# Patient Record
Sex: Male | Born: 1990 | Race: White | Hispanic: No | Marital: Single | Smoking: Never smoker
Health system: Southern US, Community
[De-identification: ages and names within clinical notes are randomized; demographics above are authoritative.]

## PROBLEM LIST (undated history)

## (undated) DIAGNOSIS — F840 Autistic disorder: Secondary | ICD-10-CM

## (undated) DIAGNOSIS — F419 Anxiety disorder, unspecified: Secondary | ICD-10-CM

## (undated) DIAGNOSIS — J302 Other seasonal allergic rhinitis: Secondary | ICD-10-CM

## (undated) DIAGNOSIS — C801 Malignant (primary) neoplasm, unspecified: Secondary | ICD-10-CM

## (undated) DIAGNOSIS — F79 Unspecified intellectual disabilities: Secondary | ICD-10-CM

## (undated) HISTORY — PX: PORTACATH PLACEMENT: SHX2246

---

## 2013-07-05 ENCOUNTER — Emergency Department (HOSPITAL_COMMUNITY)
Admission: EM | Admit: 2013-07-05 | Discharge: 2013-07-06 | Disposition: A | Discharge status: Home / Self Care | Payer: Self-pay | Attending: Emergency Medicine | Admitting: Emergency Medicine

## 2013-07-05 ENCOUNTER — Emergency Department (HOSPITAL_COMMUNITY): Payer: Self-pay

## 2013-07-05 ENCOUNTER — Encounter (HOSPITAL_COMMUNITY): Payer: Self-pay | Admitting: *Deleted

## 2013-07-05 DIAGNOSIS — Z880 Allergy status to penicillin: Secondary | ICD-10-CM | POA: Insufficient documentation

## 2013-07-05 DIAGNOSIS — N5089 Other specified disorders of the male genital organs: Secondary | ICD-10-CM | POA: Insufficient documentation

## 2013-07-05 LAB — CBC WITH DIFFERENTIAL/PLATELET
Basophils Relative: 0 % (ref 0–1)
Eosinophils Relative: 1 % (ref 0–5)
HCT: 43.6 % (ref 39.0–52.0)
Hemoglobin: 15 g/dL (ref 13.0–17.0)
Lymphocytes Relative: 17 % (ref 12–46)
MCHC: 34.4 g/dL (ref 30.0–36.0)
MCV: 83.7 fL (ref 78.0–100.0)
Monocytes Absolute: 1.2 10*3/uL — ABNORMAL HIGH (ref 0.1–1.0)
Monocytes Relative: 10 % (ref 3–12)
Neutro Abs: 8.6 10*3/uL — ABNORMAL HIGH (ref 1.7–7.7)

## 2013-07-05 LAB — BASIC METABOLIC PANEL
BUN: 13 mg/dL (ref 6–23)
CO2: 28 mEq/L (ref 19–32)
Chloride: 102 mEq/L (ref 96–112)
GFR calc Af Amer: >90 mL/min (ref >90)
Potassium: 3.4 mEq/L — ABNORMAL LOW (ref 3.5–5.1)

## 2013-07-05 LAB — URINALYSIS, ROUTINE W REFLEX MICROSCOPIC
Bilirubin Urine: NEGATIVE
Hgb urine dipstick: NEGATIVE
Specific Gravity, Urine: 1.025 (ref 1.005–1.030)
pH: 8.5 — ABNORMAL HIGH (ref 5.0–8.0)

## 2013-07-05 NOTE — ED Provider Notes (Addendum)
Scribed for Donnetta Hutching, MD, the patient was seen in room APA01/APA01. This chart was scribed by Lewanda Rife, ED scribe. Patient's care was started at 2206  CSN: [id number]     Arrival date & time 07/05/13  2013 History     First MD Initiated Contact with Patient 07/05/13 2159     Chief Complaint  Patient presents with  . Groin Swelling  . Testicle Pain   (Consider location/radiation/quality/duration/timing/severity/associated sxs/prior Treatment) The history is provided by the patient.   HPI Comments: Devon Bailey is a 22 y.o. male who presents to the Emergency Department complaining of constant profound left testicular swelling onset unknown, but more than 1 month. Denies associated pain or urinary symptoms. Denies alleviating or aggravating factors. No fever, chills, dysuria, weight loss.   Mother reports patient has ADHD History reviewed. No pertinent past medical history. History reviewed. No pertinent past surgical history. History reviewed. No pertinent family history. History  Substance Use Topics  . Smoking status: Never Smoker   . Smokeless tobacco: Not on file  . Alcohol Use: No    Review of Systems  Genitourinary: Positive for scrotal swelling (left testicle). Negative for testicular pain.   A complete 10 system review of systems was obtained and all systems are negative except as noted in the HPI and PMH.    Allergies  Penicillins  Home Medications  No current outpatient prescriptions on file. BP 138/89  Pulse 118  Temp(Src) 98.2 F (36.8 C) (Oral)  Resp 20  Ht 5\' 10"  (1.778 m)  Wt 218 lb 8 oz (99.111 kg)  BMI 31.35 kg/m2  SpO2 98% Physical Exam  Nursing note and vitals reviewed. Constitutional: He is oriented to person, place, and time. He appears well-developed and well-nourished.  HENT:  Head: Normocephalic and atraumatic.  Eyes: Conjunctivae and EOM are normal. Pupils are equal, round, and reactive to light.  Neck: Normal range of motion. Neck  supple.  Cardiovascular: Normal rate, regular rhythm and normal heart sounds.   Pulmonary/Chest: Effort normal and breath sounds normal.  Abdominal: Soft. Bowel sounds are normal.  Genitourinary: Right testis shows no tenderness. Left testis shows no tenderness. No penile tenderness.  Left testicle is 10 by 7 cm, oval in shape and non-tender.  Right testicle is normal   Musculoskeletal: Normal range of motion.  Neurological: He is alert and oriented to person, place, and time.  Skin: Skin is warm and dry.  Psychiatric: He has a normal mood and affect.    ED Course   Procedures (including critical care time) Medications - No data to display  Labs Reviewed - No data to display No results found. No diagnosis found.  MDM  Left testicle grossly enlarged. Will obtain ultrasound.   Discussed with Dr Fleet Contras   I personally performed the services described in this documentation, which was scribed in my presence. The recorded information has been reviewed and is accurate.  `  Donnetta Hutching, MD 07/05/13 2310  Donnetta Hutching, MD 07/06/13 4098

## 2013-07-05 NOTE — ED Notes (Signed)
Pt reporting pain and swelling of left testicle.  Very vague on history of complaint.  Unknown injury.  Pt states "it's been in the last couple weeks, I think".  Pt states pain "feels more like a bruise" than sharp stabbing pain.

## 2013-07-05 NOTE — ED Notes (Signed)
Pt states his left testicle has been swollen for about a month. Pain upon touching or moving.

## 2013-07-06 NOTE — ED Provider Notes (Signed)
Patient informed of results, will followup with urology.  Vida Roller, MD 07/06/13 [phone]

## 2013-07-06 NOTE — Progress Notes (Signed)
Pt charted on in error, in ventilator section by noter. Pt never on ventilator , results removed.

## 2013-08-05 ENCOUNTER — Encounter (HOSPITAL_COMMUNITY): Payer: Self-pay

## 2013-08-05 ENCOUNTER — Encounter (HOSPITAL_COMMUNITY)
Admission: RE | Admit: 2013-08-05 | Discharge: 2013-08-05 | Disposition: A | Discharge status: Home / Self Care | Payer: Medicaid Other | Source: Ambulatory Visit | Attending: Urology | Admitting: Urology

## 2013-08-05 DIAGNOSIS — Z01818 Encounter for other preprocedural examination: Secondary | ICD-10-CM | POA: Insufficient documentation

## 2013-08-05 DIAGNOSIS — Z01812 Encounter for preprocedural laboratory examination: Secondary | ICD-10-CM | POA: Insufficient documentation

## 2013-08-05 HISTORY — DX: Anxiety disorder, unspecified: F41.9

## 2013-08-05 HISTORY — DX: Autistic disorder: F84.0

## 2013-08-05 HISTORY — DX: Other seasonal allergic rhinitis: J30.2

## 2013-08-05 HISTORY — DX: Unspecified intellectual disabilities: F79

## 2013-08-05 LAB — BASIC METABOLIC PANEL
BUN: 13 mg/dL (ref 6–23)
CO2: 29 mEq/L (ref 19–32)
Calcium: 10.2 mg/dL (ref 8.4–10.5)
Creatinine, Ser: 1 mg/dL (ref 0.50–1.35)
Glucose, Bld: 110 mg/dL — ABNORMAL HIGH (ref 70–99)

## 2013-08-05 LAB — CBC
Hemoglobin: 14.3 g/dL (ref 13.0–17.0)
MCH: 27.8 pg (ref 26.0–34.0)
MCV: 83.7 fL (ref 78.0–100.0)
RBC: 5.15 MIL/uL (ref 4.22–5.81)

## 2013-08-05 NOTE — Patient Instructions (Signed)
Devon Bailey  08/05/2013   Your procedure is scheduled on: 08/07/13  Report to [facility] at [place] AM.  Call this number if you have problems the morning of surgery: [phone]   Remember:   Do not eat food or drink liquids after midnight.   Take these medicines the morning of surgery with A SIP OF WATER: none   Do not wear jewelry, make-up or nail polish.  Do not wear lotions, powders, or perfumes. You may wear deodorant.  Do not shave 48 hours prior to surgery. Men may shave face and neck.  Do not bring valuables to the hospital.  [facility] is not responsible                   for any belongings or valuables.  Contacts, dentures or bridgework may not be worn into surgery.  Leave suitcase in the car. After surgery it may be brought to your room.  For patients admitted to the hospital, checkout time is 11:00 AM the day of  discharge.   Patients discharged the day of surgery will not be allowed to drive  home.  Name and phone number of your driver: family  Special Instructions: Shower using CHG 2 nights before surgery and the night before surgery.  If you shower the day of surgery use CHG.  Use special wash - you have one bottle of CHG for all showers.  You should use approximately 1/3 of the bottle for each shower.Please read over the following fact sheets that you were given: Pain Booklet, Coughing and Deep Breathing, Surgical Site Infection Prevention, Anesthesia Post-op Instructions and Care and Recovery After Surgery  PATIENT INSTRUCTIONS POST-ANESTHESIA  IMMEDIATELY FOLLOWING SURGERY:  Do not drive or operate machinery for the first twenty four hours after surgery.  Do not make any important decisions for twenty four hours after surgery or while taking narcotic pain medications or sedatives.  If you develop intractable nausea and vomiting or a severe headache please notify your doctor immediately.  FOLLOW-UP:  Please make an appointment with your surgeon as instructed. You do not  need to follow up with anesthesia unless specifically instructed to do so.  WOUND CARE INSTRUCTIONS (if applicable):  Keep a dry clean dressing on the anesthesia/puncture wound site if there is drainage.  Once the wound has quit draining you may leave it open to air.  Generally you should leave the bandage intact for twenty four hours unless there is drainage.  If the epidural site drains for more than 36-48 hours please call the anesthesia department.  QUESTIONS?:  Please feel free to call your physician or the hospital operator if you have any questions, and they will be happy to assist you.                    Orchiectomy An orchiectomy is the removal of the testicles. This is something you should have discussed at length with your surgeon or caregiver prior to having this done. This is a man's main source of the male sex hormone testosterone. An orchiectomy is most often done to treat cancer of the prostate. Prostatic cancer usually needs testosterone to grow.  The main advantages of this procedure are that it is safe, effective and simple with a low risk of problems or complications. Surgery is also less expensive than monthly injections over the long run. The surgery has the same effects as hormone treatment. It eliminates the need for daily pills or monthly shots. Drawbacks  to surgery are it is permanent. This procedure can be done in ways that make you appear to be anatomically intact following the surgery. The testicles can be replaced with artificial testicles.  The major disadvantage seems to be psychological. Some men consider this procedure a loss of being a male. However, the psychological impact should be weighed against the therapeutic benefits of the procedure in its treatment for prostate cancer  PROCEDURE  This procedure may be done as an outpatient procedure or sometimes as an inpatient with a short hospital stay. Regular activities are usually resumed within 1 to 2 weeks. Full  recovery can take up to a month. This is a surgery which can be done under local anesthesia. This means the area being worked on will be made numb with a medication similar to Novocaine. Sometimes a general anesthetic or light sedation may be used and you may be sleeping during the procedure. A spinal anesthetic can also be used in which you are numb from the waist down.  After surgery, you will be taken to the recovery area where a nurse will watch you and check your progress. Once awake, stable, and taking fluids well, without other problems you will be allowed to go home. HOME CARE INSTRUCTIONS  Once home, an ice pack applied to the operative site for 15-20 minutes, 3-4 times per day may help with discomfort and keep swelling down. Place a towel between your skin and the ice pack.  Only take over-the-counter or prescription medicines for pain, discomfort, or fever as directed by your caregiver.  You may continue a normal diet and activities as directed.  There should be no heavy lifting (more than 10 pounds), strenuous activities or contact sports for four weeks, or as directed.  Change dressings as directed. Keep the wound dry and clean. The wound may be washed gently with soap and water. Gently blot or dab dry without rubbing. Do not take baths, use swimming pools or hot tubs for ten days, or as instructed by your caregivers. SEEK MEDICAL CARE IF:   There is redness, swelling, or increasing pain in the wound area.  Pus is coming from the wound.  An unexplained oral temperature above 102 F (38.9 C) develops.  You notice a foul smell coming from the wound or dressing.  A breaking open of the wound (edges not staying together) after sutures have been removed.  There is increasing abdominal pain. SEEK IMMEDIATE MEDICAL CARE IF:   A rash develops.  You have difficulty breathing, or you develop a reaction or side effects to medications given. Document Released: 10/06/2005 Document  Revised: 01/29/2012 Document Reviewed: 12/09/2008 [facility] Patient Information 2014 [place], [place].

## 2013-08-07 ENCOUNTER — Inpatient Hospital Stay (HOSPITAL_COMMUNITY): Payer: Medicaid Other | Admitting: Anesthesiology

## 2013-08-07 ENCOUNTER — Encounter (HOSPITAL_COMMUNITY)
Admission: RE | Disposition: A | Discharge status: Home / Self Care | Payer: Self-pay | Source: Ambulatory Visit | Attending: Urology

## 2013-08-07 ENCOUNTER — Observation Stay (HOSPITAL_COMMUNITY)
Admission: RE | Admit: 2013-08-07 | Discharge: 2013-08-08 | Disposition: A | Discharge status: Home / Self Care | Payer: Medicaid Other | Source: Ambulatory Visit | Attending: Urology | Admitting: Urology

## 2013-08-07 ENCOUNTER — Encounter (HOSPITAL_COMMUNITY): Payer: Self-pay | Admitting: *Deleted

## 2013-08-07 ENCOUNTER — Encounter (HOSPITAL_COMMUNITY): Payer: Self-pay | Admitting: Anesthesiology

## 2013-08-07 DIAGNOSIS — C6290 Malignant neoplasm of unspecified testis, unspecified whether descended or undescended: Principal | ICD-10-CM | POA: Insufficient documentation

## 2013-08-07 HISTORY — PX: RADICAL ORCHIECTOMY: SHX2285

## 2013-08-07 HISTORY — PX: ORCHIECTOMY: SHX2116

## 2013-08-07 SURGERY — ORCHIECTOMY
Anesthesia: General | Site: Groin | Laterality: Left | Wound class: Clean

## 2013-08-07 MED ORDER — ROCURONIUM BROMIDE 50 MG/5ML IV SOLN
INTRAVENOUS | Status: AC
  Filled 2013-08-07: qty 1

## 2013-08-07 MED ORDER — ARTIFICIAL TEARS OP OINT
TOPICAL_OINTMENT | OPHTHALMIC | Status: AC
  Filled 2013-08-07: qty 3.5

## 2013-08-07 MED ORDER — MIDAZOLAM HCL 2 MG/2ML IJ SOLN
INTRAMUSCULAR | Status: AC
  Filled 2013-08-07: qty 2

## 2013-08-07 MED ORDER — NEOSTIGMINE METHYLSULFATE 1 MG/ML IJ SOLN
INTRAMUSCULAR | Status: DC | PRN
  Administered 2013-08-07: 4 mg via INTRAVENOUS

## 2013-08-07 MED ORDER — BUPIVACAINE HCL (PF) 0.5 % IJ SOLN
INTRAMUSCULAR | Status: DC | PRN
  Administered 2013-08-07: 20 mL

## 2013-08-07 MED ORDER — POVIDONE-IODINE 10 % OINT PACKET
TOPICAL_OINTMENT | CUTANEOUS | Status: DC | PRN
  Administered 2013-08-07: 2 via TOPICAL

## 2013-08-07 MED ORDER — SODIUM CHLORIDE 0.9 % IR SOLN
Status: DC | PRN
  Administered 2013-08-07: 1000 mL

## 2013-08-07 MED ORDER — LIDOCAINE HCL (CARDIAC) 20 MG/ML IV SOLN
INTRAVENOUS | Status: DC | PRN
  Administered 2013-08-07: 50 mg via INTRAVENOUS

## 2013-08-07 MED ORDER — DEXTROSE-NACL 5-0.45 % IV SOLN
INTRAVENOUS | Status: DC
  Administered 2013-08-07 – 2013-08-08 (×2): via INTRAVENOUS

## 2013-08-07 MED ORDER — PROPOFOL 10 MG/ML IV EMUL
INTRAVENOUS | Status: AC
  Filled 2013-08-07: qty 20

## 2013-08-07 MED ORDER — POVIDONE-IODINE 10 % EX OINT
TOPICAL_OINTMENT | CUTANEOUS | Status: AC
  Filled 2013-08-07: qty 2

## 2013-08-07 MED ORDER — ROCURONIUM BROMIDE 100 MG/10ML IV SOLN
INTRAVENOUS | Status: DC | PRN
  Administered 2013-08-07: 35 mg via INTRAVENOUS

## 2013-08-07 MED ORDER — GLYCOPYRROLATE 0.2 MG/ML IJ SOLN
INTRAMUSCULAR | Status: AC
  Filled 2013-08-07: qty 3

## 2013-08-07 MED ORDER — FENTANYL CITRATE 0.05 MG/ML IJ SOLN
INTRAMUSCULAR | Status: AC
  Filled 2013-08-07: qty 2

## 2013-08-07 MED ORDER — FENTANYL CITRATE 0.05 MG/ML IJ SOLN
INTRAMUSCULAR | Status: DC | PRN
  Administered 2013-08-07 (×3): 50 ug via INTRAVENOUS
  Administered 2013-08-07: 150 ug via INTRAVENOUS
  Administered 2013-08-07 (×2): 100 ug via INTRAVENOUS
  Administered 2013-08-07: 50 ug via INTRAVENOUS

## 2013-08-07 MED ORDER — LIDOCAINE HCL (PF) 1 % IJ SOLN
INTRAMUSCULAR | Status: AC
  Filled 2013-08-07: qty 5

## 2013-08-07 MED ORDER — ONDANSETRON HCL 4 MG/2ML IJ SOLN
INTRAMUSCULAR | Status: AC
  Filled 2013-08-07: qty 2

## 2013-08-07 MED ORDER — LACTATED RINGERS IV SOLN
INTRAVENOUS | Status: DC | PRN
  Administered 2013-08-07 (×2): via INTRAVENOUS

## 2013-08-07 MED ORDER — MORPHINE SULFATE 2 MG/ML IJ SOLN: 2.0000 mg | INTRAMUSCULAR | Status: DC | PRN

## 2013-08-07 MED ORDER — ACETAMINOPHEN 500 MG PO TABS
500.0000 mg | ORAL_TABLET | Freq: Four times a day (QID) | ORAL | Status: DC | PRN
  Administered 2013-08-08 (×2): 500 mg via ORAL
  Filled 2013-08-07 (×2): qty 1

## 2013-08-07 MED ORDER — BUPIVACAINE HCL (PF) 0.5 % IJ SOLN
INTRAMUSCULAR | Status: AC
  Filled 2013-08-07: qty 30

## 2013-08-07 MED ORDER — LACTATED RINGERS IV SOLN
INTRAVENOUS | Status: DC
  Administered 2013-08-07: 1000 mL via INTRAVENOUS

## 2013-08-07 MED ORDER — SUCCINYLCHOLINE CHLORIDE 20 MG/ML IJ SOLN
INTRAMUSCULAR | Status: AC
  Filled 2013-08-07: qty 1

## 2013-08-07 MED ORDER — FENTANYL CITRATE 0.05 MG/ML IJ SOLN: 25.0000 ug | INTRAMUSCULAR | Status: DC | PRN

## 2013-08-07 MED ORDER — MIDAZOLAM HCL 2 MG/2ML IJ SOLN
1.0000 mg | INTRAMUSCULAR | Status: AC | PRN
  Administered 2013-08-07 (×3): 2 mg via INTRAVENOUS

## 2013-08-07 MED ORDER — PROPOFOL 10 MG/ML IV BOLUS
INTRAVENOUS | Status: DC | PRN
  Administered 2013-08-07: 160 mg via INTRAVENOUS

## 2013-08-07 MED ORDER — ONDANSETRON HCL 4 MG/2ML IJ SOLN
4.0000 mg | Freq: Once | INTRAMUSCULAR | Status: AC
Start: 2013-08-07 — End: 2013-08-07
  Administered 2013-08-07: 4 mg via INTRAVENOUS

## 2013-08-07 MED ORDER — GLYCOPYRROLATE 0.2 MG/ML IJ SOLN
INTRAMUSCULAR | Status: DC | PRN
  Administered 2013-08-07: 0.6 mg via INTRAVENOUS

## 2013-08-07 MED ORDER — ONDANSETRON HCL 4 MG/2ML IJ SOLN: 4.0000 mg | Freq: Once | INTRAMUSCULAR | Status: DC | PRN

## 2013-08-07 MED ORDER — FENTANYL CITRATE 0.05 MG/ML IJ SOLN
INTRAMUSCULAR | Status: AC
  Filled 2013-08-07: qty 5

## 2013-08-07 SURGICAL SUPPLY — 43 items
ATTRACTOMAT 16X20 MAGNETIC DRP (DRAPES) ×2 IMPLANT
CLOTH BEACON ORANGE TIMEOUT ST (SAFETY) ×2 IMPLANT
COVER LIGHT HANDLE STERIS (MISCELLANEOUS) ×4 IMPLANT
DRAIN PENROSE 12X.25 LTX STRL (MISCELLANEOUS) ×4 IMPLANT
DRESSING TELFA 8X3 (GAUZE/BANDAGES/DRESSINGS) ×2 IMPLANT
DRSG TEGADERM 2-3/8X2-3/4 SM (GAUZE/BANDAGES/DRESSINGS) ×2 IMPLANT
DRSG TEGADERM 4X10 (GAUZE/BANDAGES/DRESSINGS) ×2 IMPLANT
FORMALIN 10 PREFIL 480ML (MISCELLANEOUS) ×2 IMPLANT
GLOVE BIO SURGEON STRL SZ7 (GLOVE) ×4 IMPLANT
GLOVE BIOGEL PI IND STRL 7.0 (GLOVE) ×3 IMPLANT
GLOVE BIOGEL PI IND STRL 7.5 (GLOVE) ×1 IMPLANT
GLOVE BIOGEL PI INDICATOR 7.0 (GLOVE) ×3
GLOVE BIOGEL PI INDICATOR 7.5 (GLOVE) ×1
GLOVE ECLIPSE 6.5 STRL STRAW (GLOVE) ×2 IMPLANT
GLOVE SS BIOGEL STRL SZ 6.5 (GLOVE) ×1 IMPLANT
GLOVE SUPERSENSE BIOGEL SZ 6.5 (GLOVE) ×1
GOWN STRL REIN XL XLG (GOWN DISPOSABLE) ×8 IMPLANT
INST SET MINOR GENERAL (KITS) ×2 IMPLANT
KIT ROOM TURNOVER AP CYSTO (KITS) ×2 IMPLANT
MANIFOLD NEPTUNE II (INSTRUMENTS) ×2 IMPLANT
NEEDLE HYPO 21X1.5 SAFETY (NEEDLE) ×2 IMPLANT
NS IRRIG 1000ML POUR BTL (IV SOLUTION) ×2 IMPLANT
PACK MINOR (CUSTOM PROCEDURE TRAY) ×2 IMPLANT
PAD ARMBOARD 7.5X6 YLW CONV (MISCELLANEOUS) ×2 IMPLANT
SET BASIN LINEN APH (SET/KITS/TRAYS/PACK) ×2 IMPLANT
SOL PREP PROV IODINE SCRUB 4OZ (MISCELLANEOUS) ×2 IMPLANT
SPONGE GAUZE 4X4 12PLY (GAUZE/BANDAGES/DRESSINGS) ×2 IMPLANT
SPONGE INTESTINAL PEANUT (DISPOSABLE) ×4 IMPLANT
SPONGE LAP 18X18 X RAY DECT (DISPOSABLE) ×4 IMPLANT
SPONGE LAP 4X18 X RAY DECT (DISPOSABLE) ×2 IMPLANT
STAPLER VISISTAT (STAPLE) ×2 IMPLANT
SUPPORT SCROTAL XLRG NO STRP (MISCELLANEOUS) IMPLANT
SUT CHROMIC 3 0 SH 27 (SUTURE) ×2 IMPLANT
SUT SILK 0 FSL (SUTURE) ×24 IMPLANT
SUT VIC AB 3-0 SH 27 (SUTURE) ×1
SUT VIC AB 3-0 SH 27X BRD (SUTURE) ×1 IMPLANT
SUT VICRYL 0 27 CT2 27 ABS (SUTURE) ×2 IMPLANT
SUT VICRYL AB 3 0 TIES (SUTURE) ×2 IMPLANT
SUT VICRYL AB 3-0 BRD CT 36IN (SUTURE) ×4 IMPLANT
SYR 30ML LL (SYRINGE) ×2 IMPLANT
SYR BULB IRRIGATION 50ML (SYRINGE) ×2 IMPLANT
SYR CONTROL 10ML LL (SYRINGE) ×2 IMPLANT
TRAY FOLEY CATH 16FR SILVER (SET/KITS/TRAYS/PACK) ×2 IMPLANT

## 2013-08-07 NOTE — H&P (Signed)
NAME:  Devon Bailey, Devon Bailey NO.:  [id number]  MEDICAL RECORD NO.:  [id number]  LOCATION:                        FACILITY:  [facility]  PHYSICIAN:  Ky Barban, M.D.DATE OF BIRTH:  1991-06-22  DATE OF ADMISSION:  08/07/2013 DATE OF DISCHARGE:  LH                             HISTORY & PHYSICAL   CHIEF COMPLAINT:  Left scrotal mass.  HISTORY:  This is a 22 year old gentleman who was brought by his mother. He has a left testicular swelling.  He went to the ER on 16th of August and testicular ultrasound was done.  It shows the left testicle is very suspicious for a testicular tumor.  There is no evidence of any Epididymo-orchitis or torsion.  According to the mother, he is meant a little bit slow, so he does not know when he noticed the swelling first time and it does not hurt him.  No fever or chills.  No history of trauma.  He says that he has a little bit discomfort, but he takes a little aspirin and the pain goes away.  So when I seen him, it was very suspicious testicular mass, so I ordered alpha-fetoproteins, and alpha- fetoproteins and beta hCG.  Both of these values are significantly elevated.  Alpha fetoprotein is 1620.2, and beta hCG is 3225 and both values are significantly elevated, so most likely it is a tumor in the left testicle, so I advised the mother and the child and the young man to undergo a radical orchiectomy, and it will be explored through the left groin.  I will clamp the spermatic cord, and then delivered the testicle into the wound and remove the testicle after doing a high ligation of the spermatic cord.  Procedures, limitation, and complications especially bleeding was discussed.  Once it was done, then I am going to go ahead and do a metastatic workup with CT abdomen and pelvis.  PAST MEDICAL HISTORY:  Otherwise unremarkable.  FAMILY HISTORY:  Negative.  PERSONAL HISTORY:  He does not smoke or drink.  REVIEW OF  SYSTEMS:  Unremarkable.  PHYSICAL EXAMINATION:  GENERAL:  Well-nourished, well-developed child, not in acute distress. VITAL SIGNS:  His blood pressure is 117/76, temperature is 98.4. ABDOMEN:  Soft, flat.  Liver, spleen, and kidneys are not palpable.  No CVA tenderness. GU:  External genitalia is circumcised.  Meatus adequate.  Right testicle feels normal.  Left testicle is size of an orange.  It was hard uniformly and nontender.  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  I have told the mother that it is most likely cancer and he is to come out, and he understands, want me to go ahead and proceed with it.  Procedure, limitation, and complications are discussed in detail.     Ky Barban, M.D.     MIJ/MEDQ  D:  08/05/2013  T:  08/06/2013  Job:  [id number]

## 2013-08-07 NOTE — Brief Op Note (Signed)
08/07/2013  10:25 AM  PATIENT:  Devon Bailey  22 y.o. male  PRE-OPERATIVE DIAGNOSIS:  left testicular tumor  POST-OPERATIVE DIAGNOSIS:  left testicular tumor  PROCEDURE:  Procedure(s): RADICAL ORCHIECTOMY (Left)  SURGEON:  Surgeon(s) and Role:    * Ky Barban, MD - Primary  PHYSICIAN ASSISTANT:   ASSISTANTS: none   ANESTHESIA:   general  EBL:  Total I/O In: 1300 [I.V.:1300] Out: 20 [Blood:20]  BLOOD ADMINISTERED:none  DRAINS: none   LOCAL MEDICATIONS USED:  MARCAINE     SPECIMEN:  Source of Specimen:  l testiclewith tumor  DISPOSITION OF SPECIMEN:  PATHOLOGY  COUNTS:  YES  TOURNIQUET:  * No tourniquets in log *  DICTATION: .Other Dictation: Dictation Number report #[id number]  PLAN OF CARE: Admit for overnight observation  PATIENT DISPOSITION:  PACU - hemodynamically stable.   Delay start of Pharmacological VTE agent (>24hrs) due to surgical blood loss or risk of bleeding:

## 2013-08-07 NOTE — Anesthesia Procedure Notes (Signed)
Procedure Name: Intubation Date/Time: 08/07/2013 8:29 AM Performed by: Carolyne Littles, [place] Luppino L Pre-anesthesia Checklist: Patient identified, Patient being monitored, Timeout performed, Emergency Drugs available and Suction available Patient Re-evaluated:Patient Re-evaluated prior to inductionOxygen Delivery Method: Circle System Utilized Preoxygenation: Pre-oxygenation with 100% oxygen Intubation Type: IV induction Ventilation: Mask ventilation without difficulty Laryngoscope Size: 3 and Miller Grade View: Grade I Tube type: Oral Tube size: 7.0 mm Number of attempts: 1 Airway Equipment and Method: stylet Placement Confirmation: ETT inserted through vocal cords under direct vision,  positive ETCO2 and breath sounds checked- equal and bilateral Secured at: 21 cm Tube secured with: Tape Dental Injury: Teeth and Oropharynx as per pre-operative assessment

## 2013-08-07 NOTE — Anesthesia Preprocedure Evaluation (Signed)
Anesthesia Evaluation  Patient identified by MRN, date of birth, ID band Patient awake    Reviewed: Allergy & Precautions, H&P , NPO status , Patient's Chart, lab work & pertinent test results  Airway Mallampati: I TM Distance: >3 FB Neck ROM: Full    Dental  (+) Teeth Intact   Pulmonary  breath sounds clear to auscultation        Cardiovascular negative cardio ROS  Rhythm:Regular Rate:Normal     Neuro/Psych PSYCHIATRIC DISORDERS (Autism) Anxiety    GI/Hepatic negative GI ROS,   Endo/Other    Renal/GU      Musculoskeletal   Abdominal   Peds  Hematology   Anesthesia Other Findings   Reproductive/Obstetrics                           Anesthesia Physical Anesthesia Plan  ASA: II  Anesthesia Plan: General   Post-op Pain Management:    Induction: Intravenous  Airway Management Planned: Oral ETT  Additional Equipment:   Intra-op Plan:   Post-operative Plan: Extubation in OR  Informed Consent: I have reviewed the patients History and Physical, chart, labs and discussed the procedure including the risks, benefits and alternatives for the proposed anesthesia with the patient or authorized representative who has indicated his/her understanding and acceptance.     Plan Discussed with:   Anesthesia Plan Comments:         Anesthesia Quick Evaluation

## 2013-08-07 NOTE — Transfer of Care (Signed)
Immediate Anesthesia Transfer of Care Note  Patient: Devon Bailey  Procedure(s) Performed: Procedure(s): RADICAL ORCHIECTOMY (Left)  Patient Location: PACU  Anesthesia Type:General  Level of Consciousness: awake, alert , oriented and patient cooperative  Airway & Oxygen Therapy: Patient Spontanous Breathing and Patient connected to face mask oxygen  Post-op Assessment: Report given to PACU RN and Post -op Vital signs reviewed and stable  Post vital signs: Reviewed and stable  Complications: No apparent anesthesia complications

## 2013-08-07 NOTE — Anesthesia Postprocedure Evaluation (Addendum)
  Anesthesia Post-op Note  Patient: Devon Bailey  Procedure(s) Performed: Procedure(s): RADICAL ORCHIECTOMY (Left)  Patient Location: PACU  Anesthesia Type:General  Level of Consciousness: awake, alert , oriented and patient cooperative  Airway and Oxygen Therapy: Patient Spontanous Breathing and Patient connected to face mask oxygen  Post-op Pain: mild  Post-op Assessment: Post-op Vital signs reviewed, Patient's Cardiovascular Status Stable, Respiratory Function Stable, Patent Airway, No signs of Nausea or vomiting and Pain level controlled  Post-op Vital Signs: Reviewed and stable  Complications: No apparent anesthesia complications 08/08/13  Mr. Melberg is doing well, VSS.  No apparent anesthesia complications.

## 2013-08-07 NOTE — Progress Notes (Signed)
Pt continues to verbalizes desire to have foley catheter removed. Dr. Jerre Simon notified and foley order written to be d'ced.

## 2013-08-07 NOTE — Preoperative (Signed)
Beta Blockers   Reason not to administer Beta Blockers:Not Applicable 

## 2013-08-07 NOTE — Progress Notes (Signed)
No change in H&P on reexamination. 

## 2013-08-07 NOTE — Progress Notes (Signed)
UR Chart Review Completed  

## 2013-08-08 ENCOUNTER — Encounter (HOSPITAL_COMMUNITY): Payer: Self-pay | Admitting: Urology

## 2013-08-08 LAB — CBC
HCT: 38.6 % — ABNORMAL LOW (ref 39.0–52.0)
Hemoglobin: 12.9 g/dL — ABNORMAL LOW (ref 13.0–17.0)
MCH: 28 pg (ref 26.0–34.0)
MCHC: 33.4 g/dL (ref 30.0–36.0)

## 2013-08-08 LAB — BASIC METABOLIC PANEL
BUN: 4 mg/dL — ABNORMAL LOW (ref 6–23)
Calcium: 9.1 mg/dL (ref 8.4–10.5)
GFR calc non Af Amer: >90 mL/min (ref >90)
Glucose, Bld: 121 mg/dL — ABNORMAL HIGH (ref 70–99)

## 2013-08-08 MED ORDER — OXYCODONE-ACETAMINOPHEN 7.5-325 MG PO TABS: 1.0000 | ORAL_TABLET | Freq: Four times a day (QID) | ORAL | Status: DC | PRN

## 2013-08-08 NOTE — Op Note (Signed)
NAMENYKOLAS, BACALLAO NO.:  [id number]  MEDICAL RECORD NO.:  [id number]  LOCATION:  A305                          FACILITY:  [facility]  PHYSICIAN:  Ky Barban, M.D.DATE OF BIRTH:  31-Dec-1990  DATE OF PROCEDURE:  08/07/2013 DATE OF DISCHARGE:                              OPERATIVE REPORT   PREOPERATIVE DIAGNOSIS:  Left testicular tumor.  POSTOPERATIVE DIAGNOSIS:  Left testicular tumor.  PROCEDURE:  Left radical orchiectomy.  ANESTHESIA:  General.  DESCRIPTION OF PROCEDURE:  The patient under general endotracheal anesthesia in supine position.  After usual prep and drape, incision was made in the left groin starting from the superficial inguinal ring carrying at the level of the deep inguinal ring.  On the left side, the incision was carried down through the subcutaneous tissue.  It was divided with the help of the cautery.  External oblique aponeurosis was exposed.  It was opened up in the line of the superficial inguinal ring. Spermatic cord was isolated at the level of the pubic tubercle.  A Penrose drain was passed around it.  The tissue which is in the inguinal canal was completely mobilized along with the spermatic cord up to the level of the deep inguinal ring.  At that point, I crossclamped the spermatic cord, and in between these 2 clamps, I had already divided the vast separately, ligated that, then I proceeded to clamp the spermatic cord at the level of the deep inguinal ring and 1 large Kelly clamp was applied at that point, and it was doubly ligated with 0 Vicryl silk and then with 0 silk stitch.  After ligating this, the clamp was removed.  I left the ties longer around the silk for later identification.  The inguinal canal was completely cleaned of the tissue.  Then, I proceeded to push the scrotal mass which does not transilluminate which I had already checked with the help of the light so it is a solid tumor.  It was a rather large size of  a grapefruit and I had considerable difficulty to get it through the superficial inguinal ring.  So, I decided to enlarge the incision on top of the ring and the ring was divided.  The entrance to the left hemiscrotum was divided.  I had to watch inside to make sure that I do not get close to the penis.  Once I divided the superficial inguinal ring, I was able to push the mass through that with some difficulty, but I was able to get it out completely intact.  The attachments from the scrotal wall have already been divided with the help of blunt dissection.  The testicle was still attached to the dependent part of the scrotum, but carefully it was separated with the help of a blunt dissection to make sure I do not cut into the skin or the penis.  Once the tissue was separated, I checked the tumor with the testicle was removed and now I looked for any bleeders in the scrotum.  There was none.  Some of the bleeders were simply coagulated, some of them were ligated with 3-0 Vicryl tie.  Once this was done, then I closed the  aponeurosis with external oblique with 0 Vicryl continuous stitch, and next I closed with interrupted sutures of 3-0 Vicryl in the subcutaneous tissue.  Skin was closed with staples and after applying Betadine ointment, I injected 10 mL of Marcaine above and below the incision with #20 needle and then op- site dressing was applied.  The patient left the operating room in satisfactory condition.  He did not lose any blood.     Ky Barban, M.D.     MIJ/MEDQ  D:  08/07/2013  T:  08/08/2013  Job:  [id number]

## 2013-08-08 NOTE — Progress Notes (Signed)
Patient discharged home today ,instructions given on medications and follow up visits,Mrs Deas who is the parent verbalized understanding.Dr Jerre Simon cleaned patient's surgical site,and dressed the surgical area this am, Dr Jerre Simon informed the mom and patient that the dressing is to stay in place until the office visit,verbalized understanding. Instructions given on signs,and symptoms of infection,fever,drainage,severe pain please notify you MD,verbalized understanding.Accompanied by staff to an awaiting vehicle.

## 2013-08-08 NOTE — Discharge Instructions (Signed)
May shower tomorrow no heavy physical work. Call if fever >101F bleeding keep wound dry and clean.

## 2013-08-08 NOTE — Progress Notes (Signed)
Afebrile doing well voiding without difficulty wound healing fine will dc home today will see in office on Tuesday 4pm.

## 2013-08-08 NOTE — Progress Notes (Signed)
Nutrition Brief Note  Patient identified on the Malnutrition Screening Tool (MST) Report  Wt Readings from Last 15 Encounters:  08/07/13 210 lb (95.255 kg)  08/07/13 210 lb (95.255 kg)  08/05/13 210 lb (95.255 kg)  07/05/13 218 lb 8 oz (99.111 kg)  weight change 4% x 30 days   Body mass index is 29.3 kg/(m^2). Patient meets criteria for overweight based on current BMI.   Current diet order is Regular, good appetite and patient is consuming approximately 75% of meals at this time. Labs and medications reviewed.   No nutrition interventions warranted at this time. If nutrition issues arise, please consult RD.   Royann Shivers MS,RD,LDN,CSG Office: [phone] Pager: [phone]

## 2013-09-03 NOTE — Discharge Summary (Signed)
Discharge summary [phone]

## 2013-09-04 NOTE — Discharge Summary (Signed)
NAME:  JAKEVION, ARNEY NO.:  [id number]  MEDICAL RECORD NO.:  [id number]  LOCATION:                                 FACILITY:  PHYSICIAN:  Ky Barban, M.D.DATE OF BIRTH:  February 11, 1991  DATE OF ADMISSION:  08/07/2013 DATE OF DISCHARGE:  09/19/2014LH                              DISCHARGE SUMMARY   CHIEF COMPLAINT:  Left testicular mass.  This 22 year old gentleman was admitted with left testicular tumor which was rather large.  He underwent left radical orchiectomy under anesthesia yesterday and postop course was benign.  Next day, he was afebrile.  The wound is healing up nicely.  His labs, BMET, CBC is normal.  His WBC count is 13.5, hematocrit is 38.6.  The wound is healing up nicely.  I am going to discharge him home, and I will see him back in the office in 1 week.  Then we will decide about taking the staples out.  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  Microscopic tumor extremely limited to testicle.  His spermatic cord and surgical margins are negative.  His TNM code is pT2 pNx.  FINAL DISCHARGE DIAGNOSIS:  Left testicular cancer.  DISCHARGE CONDITION:  Improved.  DISCHARGE MEDICATIONS:  Percocet 1, 4-6 hours p.r.n., #30.  We will see him back in the office in 1 week.     Ky Barban, M.D.     MIJ/MEDQ  D:  09/03/2013  T:  09/04/2013  Job:  [id number]

## 2013-10-11 ENCOUNTER — Encounter (HOSPITAL_COMMUNITY): Payer: Self-pay

## 2013-10-22 ENCOUNTER — Encounter (HOSPITAL_COMMUNITY): Payer: Self-pay | Attending: Hematology and Oncology

## 2013-10-22 ENCOUNTER — Encounter (HOSPITAL_COMMUNITY): Payer: Self-pay

## 2013-10-22 VITALS — BP 130/81 | HR 121 | Temp 97.9°F | Resp 16 | Ht 69.0 in | Wt 195.5 lb

## 2013-10-22 DIAGNOSIS — J310 Chronic rhinitis: Secondary | ICD-10-CM

## 2013-10-22 DIAGNOSIS — C6292 Malignant neoplasm of left testis, unspecified whether descended or undescended: Secondary | ICD-10-CM

## 2013-10-22 DIAGNOSIS — C6290 Malignant neoplasm of unspecified testis, unspecified whether descended or undescended: Secondary | ICD-10-CM | POA: Insufficient documentation

## 2013-10-22 DIAGNOSIS — F79 Unspecified intellectual disabilities: Secondary | ICD-10-CM

## 2013-10-22 LAB — COMPREHENSIVE METABOLIC PANEL
Albumin: 3.6 g/dL (ref 3.5–5.2)
BUN: 9 mg/dL (ref 6–23)
Chloride: 99 mEq/L (ref 96–112)
Creatinine, Ser: 0.87 mg/dL (ref 0.50–1.35)
GFR calc Af Amer: >90 mL/min (ref >90)
GFR calc non Af Amer: >90 mL/min (ref >90)
Glucose, Bld: 94 mg/dL (ref 70–99)
Total Bilirubin: 0.2 mg/dL — ABNORMAL LOW (ref 0.3–1.2)

## 2013-10-22 LAB — CBC WITH DIFFERENTIAL/PLATELET
Basophils Relative: 0 % (ref 0–1)
Eosinophils Absolute: 0.1 10*3/uL (ref 0.0–0.7)
MCH: 24.6 pg — ABNORMAL LOW (ref 26.0–34.0)
MCHC: 32.3 g/dL (ref 30.0–36.0)
Monocytes Relative: 8 % (ref 3–12)
Neutrophils Relative %: 78 % — ABNORMAL HIGH (ref 43–77)
Platelets: 484 10*3/uL — ABNORMAL HIGH (ref 150–400)

## 2013-10-22 LAB — RETICULOCYTES
RBC.: 4.88 MIL/uL (ref 4.22–5.81)
Retic Count, Absolute: 73.2 10*3/uL (ref 19.0–186.0)
Retic Ct Pct: 1.5 % (ref 0.4–3.1)

## 2013-10-22 LAB — LACTATE DEHYDROGENASE: LDH: 802 U/L — ABNORMAL HIGH (ref 94–250)

## 2013-10-22 NOTE — Progress Notes (Signed)
Devon Bailey presented for [employer]. Labs per MD order drawn via Peripheral Line 23 gauge needle inserted in left antecubital.  Good blood return present. Procedure without incident.  Needle removed intact. Patient tolerated procedure well.
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
## 2013-10-22 NOTE — Patient Instructions (Signed)
[facility] Discharge Instructions  RECOMMENDATIONS MADE BY THE CONSULTANT AND ANY TEST RESULTS WILL BE SENT TO YOUR REFERRING PHYSICIAN.  Lab work today. We will call you if there are any abnormal results. We will schedule you for a PET scan. This is done only at [facility] in [place]. We will see you back in clinic in approximately 2 weeks after PET scan.  Thank you for choosing [facility] to provide your oncology and hematology care.  To afford each patient quality time with our providers, please arrive at least 15 minutes before your scheduled appointment time.  With your help, our goal is to use those 15 minutes to complete the necessary work-up to ensure our physicians have the information they need to help with your evaluation and healthcare recommendations.    Effective January 1st, 2014, we ask that you re-schedule your appointment with our physicians should you arrive 10 or more minutes late for your appointment.  We strive to give you quality time with our providers, and arriving late affects you and other patients whose appointments are after yours.    Again, thank you for choosing [facility].  Our hope is that these requests will decrease the amount of time that you wait before being seen by our physicians.       _____________________________________________________________  Should you have questions after your visit to [facility], please contact our office at [phone] between the hours of 8:30 a.m. and 5:00 p.m.  Voicemails left after 4:30 p.m. will not be returned until the following business day.  For prescription refill requests, have your pharmacy contact our office with your prescription refill request.

## 2013-10-23 ENCOUNTER — Other Ambulatory Visit (HOSPITAL_COMMUNITY): Payer: Self-pay | Admitting: Hematology and Oncology

## 2013-10-23 DIAGNOSIS — C6292 Malignant neoplasm of left testis, unspecified whether descended or undescended: Secondary | ICD-10-CM

## 2013-10-23 DIAGNOSIS — F79 Unspecified intellectual disabilities: Secondary | ICD-10-CM | POA: Insufficient documentation

## 2013-10-25 LAB — BETA HCG QUANT (REF LAB): Beta hCG, Tumor Marker: 43367 m[IU]/mL — ABNORMAL HIGH (ref <5.0)

## 2013-10-28 ENCOUNTER — Encounter (HOSPITAL_COMMUNITY): Payer: Self-pay | Admitting: Pharmacy Technician

## 2013-10-29 ENCOUNTER — Encounter (HOSPITAL_COMMUNITY): Payer: Self-pay | Admitting: *Deleted

## 2013-10-29 NOTE — Patient Instructions (Signed)
Pulmonary Function Tests Your caregiver has scheduled you for pulmonary function testing. Pulmonary Function Tests (PFTs) are tests which help [place] know how your lungs are working. The lungs are the large organs in your chest on both sides of the heart which allow you to breathe. The main job of the lungs is ventilation. Ventilation is moving air in and out of the lungs. The air breathed in contains oxygen which allows you to live. When you breathe out, your lungs get rid of carbon dioxide, a waste product of breathing. The medical term for breathing in is inhalation. Breathing out is called exhalation. Some medical conditions interfere with breathing. This may be sudden and short lived as with pneumonia, or may be long standing such as with COPD (chronic obstructive pulmonary disease) that which may come as a result of years of smoking. CONDITIONS THAT INTERFERE WITH NORMAL BREATHING ARE CALLED RESTRICTIVE OR OBSTRUCTIVE  An obstructive condition occurs when air has difficulty flowing into the lungs due to resistance. This causes a decreased flow of air in the lungs. A restrictive condition occurs when the chest muscles are unable to expand adequately. This also causes a decreased flow of air in the lungs. USES OF PULMONARY FUNCTION TESTS Lung (pulmonary) function studies are used to find out causes of lung problems and what will be the best treatment. The "PFT" terms listed below refer to different procedures that measure lung function in different ways. Pulmonary function testing is quick, simple and safe for most people. There are many different reasons why PFTs may be ordered.   For healthy individuals as part of a routine physical examination  In industrial plants to follow how your lungs are working when exposed to chemicals over a long period of time  When an illness involving the lungs is suspected.  PFTs may be used to assess the lung function of patients prior to surgery or other procedures  in patients who have current lung and/or heart problems.  The test is also used for people who are smokers or who have other conditions that might be affected by surgery or other procedures. Some common measurements or values (terms) you may hear your caregiver use are:  Tidal volume (TV) - amount of air breathed in or out during normal breathing.  Minute volume (MV) - total amount of air breathed in and out per minute.  Vital capacity (VC) - total volume of air that can be breathed out after the largest breath in you can take.  Functional residual capacity (FRC) - amount of air remaining in lungs after normal breathing out.  Total lung capacity - total amount of air in the lungs with the largest breath you can take.  Forced vital capacity (FVC) - the amount of air forced out quickly after taking the largest possible breath.  Forced expiratory volume (FEV) - volume of air breathed out during the first, second, and third seconds of the FVC test.  Forced expiratory flow (FEF) - average rate of flow during the middle half of the FVC test.  Peak expiratory flow rate (PEFR) - maximum amount of air during forced breathing out. The values of these tests vary from person to person. Your values are compared to other people who have had the test and are similar to you in age, size, etc. They can also be compared to previous tests following treatment of lung disease. The tests can determine if lung function is getting better and if the treatments used are successful. COMPLICATIONS OF  TESTING MAY INCLUDE:  Light-headedness due to over breathing (hyperventilation).  An asthmatic attack from deep breathing. SOME REASONS FOR NOT DOING THE TEST INCLUDE:  Recent eye surgery, because of increased pressure inside the eyes during the procedure.  Recent abdominal or chest surgery, because of inability to take deep breaths.  Chest pain or heart problems.  Tuberculosis or respiratory infections, such as  pneumonia, a cold, or the flu. Discuss concerns with your caregiver before your procedure. PREPARATION FOR TEST   Avoid eating a large meal before your test.  Do not smoke before your test.  Take medications as ordered by your caregiver.  Wear comfortable clothing which will not interfere with breathing. If done as an outpatient, you should be present 60 minutes prior to your procedure or as directed.  DURING THE TEST  You will be given a soft nose clip to wear during the procedure so that all of your breaths will go through your mouth instead of your nose.  You will be given a sterile (germ-free) mouthpiece that will be attached to the spirometer. The spirometer is the machine that measures your breathing.  You will be instructed to perform various breathing maneuvers. The maneuvers will be done by inhaling (breathing in) and exhaling (breathing out). Depending on what measurements are ordered, you may be asked to repeat the maneuvers several times before the test is completed.  You may be given a bronchodilator after testing has been performed. A bronchodilator is a medication which makes the small air passages in your lungs larger. These medications usually make it easier to breathe. The tests are then repeated several minutes later after the bronchodilator has taken effect.  You will be monitored carefully during the procedure for faintness, dizziness, difficulty breathing, or any other problems. AFTER THE PROCEDURE   You may resume your usual diet, medications, and activities unless your physician advises you otherwise.  Your caregiver will go over your test results with you and determine what is causing your lung problems and what treatments may be helpful. Document Released: 06/29/2004 Document Revised: 01/29/2012 Document Reviewed: 11/04/2008 [facility] Patient Information 2014 [place], [place]. Implanted Port Instructions An implanted port is a central line that has a round shape  and is placed under the skin. It is used for long-term IV (intravenous) access for:  Medicine.  Fluids.  Liquid nutrition, such as TPN (total parenteral nutrition).  Blood samples. Ports can be placed:  In the chest area just below the collarbone (this is the most common place.)  In the arms.  In the belly (abdomen) area.  In the legs. PARTS OF THE PORT A port has 2 main parts:  The reservoir. The reservoir is round, disc-shaped, and will be a small, raised area under your skin.  The reservoir is the part where a needle is inserted (accessed) to either give medicines or to draw blood.  The catheter. The catheter is a long, slender tube that extends from the reservoir. The catheter is placed into a large vein.  Medicine that is inserted into the reservoir goes into the catheter and then into the vein. INSERTION OF THE PORT  The port is surgically placed in either an operating room or in a procedural area (interventional radiology).  Medicine may be given to help you relax during the procedure.  The skin where the port will be inserted is numbed (local anesthetic).  1 or 2 small cuts (incisions) will be made in the skin to insert the port.  The port  can be used after it has been inserted. INCISION SITE CARE  The incision site may have small adhesive strips on it. This helps keep the incision site closed. Sometimes, no adhesive strips are placed. Instead of adhesive strips, a special kind of surgical glue is used to keep the incision closed.  If adhesive strips were placed on the incision sites, do not take them off. They will fall off on their own.  The incision site may be sore for 1 to 2 days. Pain medicine can help.  Do not get the incision site wet. Bathe or shower as directed by your caregiver.  The incision site should heal in 5 to 7 days. A small scar may form after the incision has healed. ACCESSING THE PORT Special steps must be taken to access the  port:  Before the port is accessed, a numbing cream can be placed on the skin. This helps numb the skin over the port site.  A sterile technique is used to access the port.  The port is accessed with a needle. Only "non-coring" port needles should be used to access the port. Once the port is accessed, a blood return should be checked. This helps ensure the port is in the vein and is not clogged (clotted).  If your caregiver believes your port should remain accessed, a clear (transparent) bandage will be placed over the needle site. The bandage and needle will need to be changed every week or as directed by your caregiver.  Keep the bandage covering the needle clean and dry. Do not get it wet. Follow your caregiver's instructions on how to take a shower or bath when the port is accessed.  If your port does not need to stay accessed, no bandage is needed over the port. FLUSHING THE PORT Flushing the port keeps it from getting clogged. How often the port is flushed depends on:  If a constant infusion is running. If a constant infusion is running, the port may not need to be flushed.  If intermittent medicines are given.  If the port is not being used. For intermittent medicines:  The port will need to be flushed:  After medicines have been given.  After blood has been drawn.  As part of routine maintenance.  A port is normally flushed with:  Normal saline.  Heparin.  Follow your caregiver's advice on how often, how much, and the type of flush to use on your port. IMPORTANT PORT INFORMATION  Tell your caregiver if you are allergic to heparin.  After your port is placed, you will get a manufacturer's information card. The card has information about your port. Keep this card with you at all times.  There are many types of ports available. Know what kind of port you have.  In case of an emergency, it may be helpful to wear a medical alert bracelet. This can help alert health  care workers that you have a port.  The port can stay in for as long as your caregiver believes it is necessary.  When it is time for the port to come out, surgery will be done to remove it. The surgery will be similar to how the port was put in.  If you are in the hospital or clinic:  Your port will be taken care of and flushed by a nurse.  If you are at home:  A home health care nurse may give medicines and take care of the port.  You or a family member  can get special training and directions for giving medicine and taking care of the port at home. SEEK IMMEDIATE MEDICAL CARE IF:   Your port does not flush or you are unable to get a blood return.  New drainage or pus is coming from the incision.  A bad smell is coming from the incision site.  You develop swelling or increased redness at the incision site.  You develop increased swelling or pain at the port site.  You develop swelling or pain in the surrounding skin near the port.  You have an oral temperature above 102 F (38.9 C), not controlled by medicine. MAKE SURE YOU:   Understand these instructions.  Will watch your condition.  Will get help right away if you are not doing well or get worse. Document Released: 11/06/2005 Document Revised: 01/29/2012 Document Reviewed: 01/28/2009 [facility] Patient Information 2014 [place], [place]. Positron Emission Tomography (PET Scan) PET stands for positron emission tomography. This is a test similar to an X-ray. Pictures can be taken of a body part after injection of a very small dose of a chemical called a radionuclide. This is combined with sugar, water, or ammonia to give off tiny particles called positrons. The positrons emitted are like small bursts of energy that can be detected by a scanner. They are processed by a computer to create images. These images can be used to study different diseases. They are often used to study cancer and cancer therapy. A scan of the entire body  can be done and used to study all its parts. Because this test is tagged to a sugar used by cells, the bursts of energy show up differently in cells that use sugar faster. The computer is able to produce a color-coded picture based on this. The colors and amount of brightness on a PET image show different levels of tissue or organ function. For example, a cancer grows faster than healthy tissue and uses more sugar than normal tissue. It will absorb more of the substance injected. This causes it to appear brighter than normal tissue on the PET image. A specialist will read and explain the images. Other examinations, such as recent CT (or CAT) scans or MRI scans may help with interpretation and should be brought along. There are usually no restrictions after the test. You should drink plenty of fluids to flush the radioactive substance from your body. BEFORE THE PROCEDURE   PET is usually an outpatient procedure. Wear comfortable, loose-fitting clothes.  Do not eat for four hours before the scan. You will be encouraged to drink water.  Your caregiver will instruct you regarding the use of medications before the test.  Note: Diabetic patients should ask for any specific diet guidelines to control glucose (sugar) levels during the day of the test. There are limitations with the test if your blood sugar is not controlled during or before the test.  Be on time because of the rapid decay of the radioactive material that must be injected. PROCEDURE  Before the procedure begins a small amount of harmless radioactive material will be injected into a vein. This means you will have a needle stick. It will take from 30 minutes to one hour for the material to travel around your body in preparation for the scan. You will lie on a cushioned table and be moved through the center of a machine that looks like a large doughnut. This is the machine that detects the positrons. It is connected to a computer that produces  images that can be viewed on a monitor. This will take about 30 minutes to an hour, during which you must remain still. Let your caregiver know if this will be difficult for you. Also, let your caregiver know if you need a sedative or help dealing with claustrophobia (feeling uncomfortable in enclosed spaces). HOME CARE INSTRUCTIONS   For the protection of your privacy, test results can not be given over the phone. Make sure you receive the results of your test. Ask as to how these results are to be obtained if you have not been informed. It is your responsibility to obtain your test results.  Drink several 8-once glasses of water following the test to flush the small amount of radioactive material out of your body.  Keep your follow-up appointments. Document Released: 05/13/2003 Document Revised: 01/29/2012 Document Reviewed: 11/06/2005 [facility] Patient Information 2014 [place], [place].

## 2013-10-29 NOTE — Progress Notes (Unsigned)
I will be mailing out a copy of appointments to the patient's mother Efraim Kaufmann regarding his PFTs here @ [facility] and information regarding a Port-a-cath.

## 2013-10-30 ENCOUNTER — Encounter (HOSPITAL_COMMUNITY)
Admission: RE | Admit: 2013-10-30 | Discharge: 2013-10-30 | Disposition: A | Discharge status: Home / Self Care | Payer: Medicaid Other | Source: Ambulatory Visit | Attending: Hematology and Oncology | Admitting: Hematology and Oncology

## 2013-10-30 ENCOUNTER — Other Ambulatory Visit: Payer: Self-pay | Admitting: Radiology

## 2013-10-30 DIAGNOSIS — C6292 Malignant neoplasm of left testis, unspecified whether descended or undescended: Secondary | ICD-10-CM

## 2013-10-30 DIAGNOSIS — C6290 Malignant neoplasm of unspecified testis, unspecified whether descended or undescended: Secondary | ICD-10-CM | POA: Insufficient documentation

## 2013-10-30 LAB — GLUCOSE, CAPILLARY: Glucose-Capillary: 82 mg/dL (ref 70–99)

## 2013-10-30 MED ORDER — FLUDEOXYGLUCOSE F - 18 (FDG) INJECTION
17.6000 | Freq: Once | INTRAVENOUS | Status: AC | PRN
  Administered 2013-10-30: 17.6 via INTRAVENOUS

## 2013-10-31 ENCOUNTER — Other Ambulatory Visit (HOSPITAL_COMMUNITY): Payer: Self-pay | Admitting: Hematology and Oncology

## 2013-10-31 DIAGNOSIS — C781 Secondary malignant neoplasm of mediastinum: Secondary | ICD-10-CM

## 2013-10-31 DIAGNOSIS — C6292 Malignant neoplasm of left testis, unspecified whether descended or undescended: Secondary | ICD-10-CM

## 2013-11-03 ENCOUNTER — Other Ambulatory Visit (HOSPITAL_COMMUNITY): Payer: Self-pay | Admitting: Hematology and Oncology

## 2013-11-03 ENCOUNTER — Ambulatory Visit (HOSPITAL_COMMUNITY)
Admission: RE | Admit: 2013-11-03 | Discharge: 2013-11-03 | Disposition: A | Discharge status: Home / Self Care | Payer: Medicaid Other | Source: Ambulatory Visit | Attending: Hematology and Oncology | Admitting: Hematology and Oncology

## 2013-11-03 ENCOUNTER — Encounter (HOSPITAL_COMMUNITY): Payer: Self-pay

## 2013-11-03 DIAGNOSIS — C6292 Malignant neoplasm of left testis, unspecified whether descended or undescended: Secondary | ICD-10-CM

## 2013-11-03 DIAGNOSIS — C6290 Malignant neoplasm of unspecified testis, unspecified whether descended or undescended: Secondary | ICD-10-CM | POA: Insufficient documentation

## 2013-11-03 DIAGNOSIS — C801 Malignant (primary) neoplasm, unspecified: Secondary | ICD-10-CM | POA: Insufficient documentation

## 2013-11-03 DIAGNOSIS — F840 Autistic disorder: Secondary | ICD-10-CM | POA: Insufficient documentation

## 2013-11-03 DIAGNOSIS — Z880 Allergy status to penicillin: Secondary | ICD-10-CM | POA: Insufficient documentation

## 2013-11-03 LAB — CBC WITH DIFFERENTIAL/PLATELET
Basophils Absolute: 0 10*3/uL (ref 0.0–0.1)
Eosinophils Relative: 2 % (ref 0–5)
Lymphocytes Relative: 13 % (ref 12–46)
Lymphs Abs: 1 10*3/uL (ref 0.7–4.0)
MCV: 74.7 fL — ABNORMAL LOW (ref 78.0–100.0)
Neutro Abs: 5.9 10*3/uL (ref 1.7–7.7)
Neutrophils Relative %: 72 % (ref 43–77)
Platelets: 306 10*3/uL (ref 150–400)
RBC: 4.94 MIL/uL (ref 4.22–5.81)
WBC: 8.2 10*3/uL (ref 4.0–10.5)

## 2013-11-03 LAB — APTT: aPTT: 34 seconds (ref 24–37)

## 2013-11-03 LAB — PROTIME-INR
INR: 1.07 (ref 0.00–1.49)
Prothrombin Time: 13.7 seconds (ref 11.6–15.2)

## 2013-11-03 MED ORDER — VANCOMYCIN HCL IN DEXTROSE 1-5 GM/200ML-% IV SOLN
1000.0000 mg | Freq: Once | INTRAVENOUS | Status: AC
  Administered 2013-11-03: 1000 mg via INTRAVENOUS
  Filled 2013-11-03: qty 200

## 2013-11-03 MED ORDER — MIDAZOLAM HCL 2 MG/2ML IJ SOLN
INTRAMUSCULAR | Status: AC | PRN
  Administered 2013-11-03: 2 mg via INTRAVENOUS
  Administered 2013-11-03: 1 mg via INTRAVENOUS

## 2013-11-03 MED ORDER — FENTANYL CITRATE 0.05 MG/ML IJ SOLN
INTRAMUSCULAR | Status: AC | PRN
  Administered 2013-11-03 (×3): 50 ug via INTRAVENOUS

## 2013-11-03 MED ORDER — METOCLOPRAMIDE HCL 5 MG PO TABS: 5.0000 mg | ORAL_TABLET | Freq: Four times a day (QID) | ORAL | Status: DC | PRN

## 2013-11-03 MED ORDER — FENTANYL CITRATE 0.05 MG/ML IJ SOLN
INTRAMUSCULAR | Status: AC
  Filled 2013-11-03: qty 6

## 2013-11-03 MED ORDER — LIDOCAINE-PRILOCAINE 2.5-2.5 % EX CREA: TOPICAL_CREAM | CUTANEOUS | Status: DC

## 2013-11-03 MED ORDER — LIDOCAINE-EPINEPHRINE (PF) 2 %-1:200000 IJ SOLN
INTRAMUSCULAR | Status: AC
  Filled 2013-11-03: qty 20

## 2013-11-03 MED ORDER — MIDAZOLAM HCL 2 MG/2ML IJ SOLN
INTRAMUSCULAR | Status: AC
  Filled 2013-11-03: qty 6

## 2013-11-03 MED ORDER — HEPARIN SOD (PORK) LOCK FLUSH 100 UNIT/ML IV SOLN
INTRAVENOUS | Status: AC | PRN
  Administered 2013-11-03: 500 [IU]

## 2013-11-03 MED ORDER — MIDAZOLAM HCL 2 MG/2ML IJ SOLN
INTRAMUSCULAR | Status: AC
  Filled 2013-11-03: qty 4

## 2013-11-03 MED ORDER — PROCHLORPERAZINE MALEATE 10 MG PO TABS: 10.0000 mg | ORAL_TABLET | Freq: Four times a day (QID) | ORAL | Status: DC | PRN

## 2013-11-03 MED ORDER — LIDOCAINE HCL 1 % IJ SOLN
INTRAMUSCULAR | Status: AC
  Filled 2013-11-03: qty 20

## 2013-11-03 MED ORDER — SODIUM CHLORIDE 0.9 % IV SOLN
INTRAVENOUS | Status: DC
  Administered 2013-11-03: 10:00:00 via INTRAVENOUS

## 2013-11-03 NOTE — Procedures (Signed)
Placement of portacath.  Tip in lower SVC.  No immediate complication.

## 2013-11-03 NOTE — Patient Instructions (Addendum)
[facility]   CHEMOTHERAPY INSTRUCTIONS  VP16 (Etoposide) - this medication can lower your blood pressure. We will monitor this during chemo. Bone marrow suppression (lowers white blood cells (fight infection), lowers red blood cells (make up your blood), lowers platelets (help blood to clot). Hair loss, loss of appetite.   Bleomycin - hair loss, kidney toxicity, pulmonary fibrosis, fever, chills, hypersensitivity or anaphylactic reaction (rare). Patients with lymphoma have a higher incidence of anaphylaxis after receiving bleomycin than do other patients who receive the drug. Patients who have received bleomycin are at risk for pulmonary toxicity when exposed to oxygen during surgery. *You must always -for the rest of your life- disclose to your surgeon/anesthesiologist that you have taken bleomycin to prevent a fatal episode of pulmonary failure*. The first sign of pulmonary toxicity is a *cough*.   Cisplatin - this medication is hard on your kidneys. This is why we give you a large amount of fluids through your IV while you are here getting chemo. We also need you drinking 64 oz of fluid (preferably water/decaff fluids) 2 days prior to chemo and for up to 4-5 days after chemo. Drink more if you can. This will help keep your kidneys flushed. This can also cause acute and/or delayed nausea/vomiting. You must take your nausea meds as prescribed if you get nauseated. Do not wait until you start vomiting. This med can also cause peripheral neuropathy (numbness/tingling/burning in hands/fingers/feet/toes). Let us know if this develops so that we can monitor it and treat if necessary.   On Days 1-5 of chemo, you will receive IV premeds to reduce nausea/vomiting. The names of these drugs are Aloxi, Emend, and Dexamethasone. You will not receive all 3 of these drugs each day you come for chemo. However, you will receive Dexamethasone IV on Days 1-5. Side Effects of  Dexamethasone include: feeling nervous/anxious, irritability, trouble sleeping, increase in energy, potential increase in appetite, and sometimes people turn red in the face, neck, and upper chest. The redness will pass.   On Days 9 & 16 of chemo, you will receive Prochlorperazine/Compazine tablet for nausea before your chemotherapy.   POTENTIAL SIDE EFFECTS OF TREATMENT: Increased Susceptibility to Infection/Bone Marrow Suppression, Nausea/Vomiting, Constipation/Diarrhea, Hair Thinning/Hair Loss, Changes in Character of Skin and Nails (brittleness, dryness,etc.), Sun Sensitivity and Mouth Sores   SELF IMAGE NEEDS AND REFERRALS MADE: Obtain hair accessories as soon as possible (caps,etc.)   EDUCATIONAL MATERIALS GIVEN AND REVIEWED: Chemotherapy and You booklet Specific Instructions Sheets: Etoposide, Bleomycin, Cisplatin, Dexamethasone, Aloxi, Emend, Metoclopramide, Prochlorperazine,    SELF CARE ACTIVITIES WHILE ON CHEMOTHERAPY:  Increase your fluid intake 48 hours prior to treatment and drink at least 2 quarts per day after treatment., No alcohol intake., No aspirin or other medications unless approved by your oncologist., Eat foods that are light and easy to digest., Eat foods at cold or room temperature., No fried, fatty, or spicy foods immediately before or after treatment., Have teeth cleaned professionally before starting treatment. Keep dentures and partial plates clean., Use soft toothbrush and do not use mouthwashes that contain alcohol. Biotene is a good mouthwash that is available at most pharmacies or may be ordered by calling [phone]., Use warm salt water gargles (1 teaspoon salt per 1 quart warm water) before and after meals and at bedtime. Or you may rinse with 2 tablespoons of three -percent hydrogen peroxide mixed in eight ounces of water., Always use sunscreen with SPF (Sun Protection Factor) of 30 or higher.,  Use your nausea medication as directed to prevent nausea. and  Use your stool softener or laxative as directed to prevent constipation.  Please wash your hands for at least 30 seconds using warm soapy water. Handwashing is the #1 way to prevent the spread of germs. Stay away from sick people or people who are getting over a cold. If you develop respiratory systems such as green/yellow mucus production or productive cough or persistent cough let us know and we will see if you need an antibiotic. It is a good idea to keep a pair of gloves on when going into grocery stores/[employer] to decrease your risk of coming into contact with germs on the carts, etc. Carry alcohol hand gel with you at all times and use it frequently if out in public. All foods need to be cooked thoroughly. No raw foods. No medium or undercooked meats, eggs. If your food is cooked medium well, it does not need to be hot pink or saturated with bloody liquid at all. Vegetables and fruits need to be washed/rinsed under the faucet with a dish detergent before being consumed. You can eat raw fruits and vegetables unless we tell you otherwise but it would be best if you cooked them or bought frozen. Do not eat off of salad bars or hot bars unless you really trust the cleanliness of the restaurant. If you need dental work, please let us know before you go for your appointment so that we can coordinate the best possible time for you in regards to your chemo regimen. You need to also let your dentist know that you are actively taking chemo. We may need to do labs prior to your dental appointment. We also want your bowels moving at least every other day. If this is not happening, we need to know so that we can get you on a bowel regimen to help you go.      MEDICATIONS: You have been given prescriptions for the following medications:  Metoclopramide/Reglan 5mg  tablet. Starting the day after chemo, take 1 tablet four times a day x 48 hours. Then may take 1 tablet four times a day as needed for nausea/vomiting.    Prochlorperazine/Compazine 10mg  tablet. Starting the day after chemo, take 1 tablet four times a day x 48 hours. Then may take 1 tablet four times a day as needed for nausea/vomiting.   Tylenol 325mg  tablet. On the days of Bleomycin chemo, start taking 650mg  in the am and continue every 6 hours x 48 hours. This is to prevent hyperthermia reaction to the bleomycin per Dr. Zigmund Daniel.  EMLA cream. Apply a quarter size amount to port site 1 hour prior to chemo. Do not rub in. Cover with plastic wrap.   Over-the-Counter Meds:   Colace - this is a stool softener. Take 100mg  capsule 2- 6 times a day as needed. If you have to take more than 6 capsules of Colace a day call the Cancer Center.  Senna - this is a mild laxative used to treat mild constipation. May take up to 3 tabs nightly @ bedtime as needed for constipation.   Milk of Magnesia - this is a laxative used to treat moderate to severe constipation. May take 2-4 tablespoons every 8 hours as needed. May increase to 8 tablespoons x 1 dose and if no bowel movement call the Cancer Center.  Imodium - this is for diarrhea. Take 2 tabs after 1st loose stool and then 1 tab after each loose stool until you  go a total of 12 hours without a loose stool. Call Cancer Center if loose stools continue.   SYMPTOMS TO REPORT AS SOON AS POSSIBLE AFTER TREATMENT:  FEVER GREATER THAN 100.5 F  CHILLS WITH OR WITHOUT FEVER  NAUSEA AND VOMITING THAT IS NOT CONTROLLED WITH YOUR NAUSEA MEDICATION  UNUSUAL SHORTNESS OF BREATH  UNUSUAL BRUISING OR BLEEDING  TENDERNESS IN MOUTH AND THROAT WITH OR WITHOUT PRESENCE OF ULCERS  URINARY PROBLEMS  BOWEL PROBLEMS  UNUSUAL RASH    Wear comfortable clothing and clothing appropriate for easy access to any Portacath or PICC line. Let us know if there is anything that we can do to make your therapy better!      I have been informed and understand all of the instructions given to me and have received a copy. I  have been instructed to call the clinic [phone] or my family physician as soon as possible for continued medical care, if indicated. I do not have any more questions at this time but understand that I may call the Cancer Center or the Patient Navigator at [phone] during office hours should I have questions or need assistance in obtaining follow-up care.      _________________________________________      _______________     __________ Signature of Patient or Authorized Representative        Date                            Time      _________________________________________ Nurse's Signature      Cisplatin injection What is this medicine? CISPLATIN (SIS pla tin) is a chemotherapy drug. It targets fast dividing cells, like cancer cells, and causes these cells to die. This medicine is used to treat many types of cancer like bladder, ovarian, and testicular cancers. This medicine may be used for other purposes; ask your health care provider or pharmacist if you have questions. COMMON BRAND NAME(S): Platinol -AQ, Platinol What should I tell my health care provider before I take this medicine? They need to know if you have any of these conditions: -blood disorders -hearing problems -kidney disease -recent or ongoing radiation therapy -an unusual or allergic reaction to cisplatin, carboplatin, other chemotherapy, other medicines, foods, dyes, or preservatives -pregnant or trying to get pregnant -breast-feeding How should I use this medicine? This drug is given as an infusion into a vein. It is administered in a hospital or clinic by a specially trained health care professional. Talk to your pediatrician regarding the use of this medicine in children. Special care may be needed. Overdosage: If you think you have taken too much of this medicine contact a poison control center or emergency room at once. NOTE: This medicine is only for you. Do not share this medicine with  others. What if I miss a dose? It is important not to miss a dose. Call your doctor or health care professional if you are unable to keep an appointment. What may interact with this medicine? -dofetilide -foscarnet -medicines for seizures -medicines to increase blood counts like filgrastim, pegfilgrastim, sargramostim -probenecid -pyridoxine used with altretamine -rituximab -some antibiotics like amikacin, gentamicin, neomycin, polymyxin B, streptomycin, tobramycin -sulfinpyrazone -vaccines -zalcitabine Talk to your doctor or health care professional before taking any of these medicines: -acetaminophen -aspirin -ibuprofen -ketoprofen -naproxen This list may not describe all possible interactions. Give your health care provider a list of all the medicines, herbs, non-prescription drugs, or dietary  supplements you use. Also tell them if you smoke, drink alcohol, or use illegal drugs. Some items may interact with your medicine. What should I watch for while using this medicine? Your condition will be monitored carefully while you are receiving this medicine. You will need important blood work done while you are taking this medicine. This drug may make you feel generally unwell. This is not uncommon, as chemotherapy can affect healthy cells as well as cancer cells. Report any side effects. Continue your course of treatment even though you feel ill unless your doctor tells you to stop. In some cases, you may be given additional medicines to help with side effects. Follow all directions for their use. Call your doctor or health care professional for advice if you get a fever, chills or sore throat, or other symptoms of a cold or flu. Do not treat yourself. This drug decreases your body's ability to fight infections. Try to avoid being around people who are sick. This medicine may increase your risk to bruise or bleed. Call your doctor or health care professional if you notice any unusual  bleeding. Be careful brushing and flossing your teeth or using a toothpick because you may get an infection or bleed more easily. If you have any dental work done, tell your dentist you are receiving this medicine. Avoid taking products that contain aspirin, acetaminophen, ibuprofen, naproxen, or ketoprofen unless instructed by your doctor. These medicines may hide a fever. Do not become pregnant while taking this medicine. Women should inform their doctor if they wish to become pregnant or think they might be pregnant. There is a potential for serious side effects to an unborn child. Talk to your health care professional or pharmacist for more information. Do not breast-feed an infant while taking this medicine. Drink fluids as directed while you are taking this medicine. This will help protect your kidneys. Call your doctor or health care professional if you get diarrhea. Do not treat yourself. What side effects may I notice from receiving this medicine? Side effects that you should report to your doctor or health care professional as soon as possible: -allergic reactions like skin rash, itching or hives, swelling of the face, lips, or tongue -signs of infection - fever or chills, cough, sore throat, pain or difficulty passing urine -signs of decreased platelets or bleeding - bruising, pinpoint red spots on the skin, black, tarry stools, nosebleeds -signs of decreased red blood cells - unusually weak or tired, fainting spells, lightheadedness -breathing problems -changes in hearing -gout pain -low blood counts - This drug may decrease the number of white blood cells, red blood cells and platelets. You may be at increased risk for infections and bleeding. -nausea and vomiting -pain, swelling, redness or irritation at the injection site -pain, tingling, numbness in the hands or feet -problems with balance, movement -trouble passing urine or change in the amount of urine Side effects that usually  do not require medical attention (report to your doctor or health care professional if they continue or are bothersome): -changes in vision -loss of appetite -metallic taste in the mouth or changes in taste This list may not describe all possible side effects. Call your doctor for medical advice about side effects. You may report side effects to FDA at 1-800-FDA-1088. Where should I keep my medicine? This drug is given in a hospital or clinic and will not be stored at home. NOTE: This sheet is a summary. It may not cover all possible information. If you  have questions about this medicine, talk to your doctor, pharmacist, or health care provider.  2014, [employer] Standard. (2008-02-11 14:40:54) Etoposide, VP-16 injection What is this medicine? ETOPOSIDE, VP-16 (e toe POE side) is a chemotherapy drug. It is used to treat testicular cancer, lung cancer, and other cancers. This medicine may be used for other purposes; ask your health care provider or pharmacist if you have questions. COMMON BRAND NAME(S): Etopophos, Toposar, VePesid What should I tell my health care provider before I take this medicine? They need to know if you have any of these conditions: -infection -kidney disease -low blood counts, like low white cell, platelet, or red cell counts -an unusual or allergic reaction to etoposide, other chemotherapeutic agents, other medicines, foods, dyes, or preservatives -pregnant or trying to get pregnant -breast-feeding How should I use this medicine? This medicine is for infusion into a vein. It is administered in a hospital or clinic by a specially trained health care professional. Talk to your pediatrician regarding the use of this medicine in children. Special care may be needed. Overdosage: If you think you have taken too much of this medicine contact a poison control center or emergency room at once. NOTE: This medicine is only for you. Do not share this medicine with  others. What if I miss a dose? It is important not to miss your dose. Call your doctor or health care professional if you are unable to keep an appointment. What may interact with this medicine? -cyclosporine -medicines to increase blood counts like filgrastim, pegfilgrastim, sargramostim -vaccines This list may not describe all possible interactions. Give your health care provider a list of all the medicines, herbs, non-prescription drugs, or dietary supplements you use. Also tell them if you smoke, drink alcohol, or use illegal drugs. Some items may interact with your medicine. What should I watch for while using this medicine? Visit your doctor for checks on your progress. This drug may make you feel generally unwell. This is not uncommon, as chemotherapy can affect healthy cells as well as cancer cells. Report any side effects. Continue your course of treatment even though you feel ill unless your doctor tells you to stop. In some cases, you may be given additional medicines to help with side effects. Follow all directions for their use. Call your doctor or health care professional for advice if you get a fever, chills or sore throat, or other symptoms of a cold or flu. Do not treat yourself. This drug decreases your body's ability to fight infections. Try to avoid being around people who are sick. This medicine may increase your risk to bruise or bleed. Call your doctor or health care professional if you notice any unusual bleeding. Be careful brushing and flossing your teeth or using a toothpick because you may get an infection or bleed more easily. If you have any dental work done, tell your dentist you are receiving this medicine. Avoid taking products that contain aspirin, acetaminophen, ibuprofen, naproxen, or ketoprofen unless instructed by your doctor. These medicines may hide a fever. Do not become pregnant while taking this medicine. Women should inform their doctor if they wish to  become pregnant or think they might be pregnant. There is a potential for serious side effects to an unborn child. Talk to your health care professional or pharmacist for more information. Do not breast-feed an infant while taking this medicine. What side effects may I notice from receiving this medicine? Side effects that you should report to your doctor or health  care professional as soon as possible: -allergic reactions like skin rash, itching or hives, swelling of the face, lips, or tongue -low blood counts - this medicine may decrease the number of white blood cells, red blood cells and platelets. You may be at increased risk for infections and bleeding. -signs of infection - fever or chills, cough, sore throat, pain or difficulty passing urine -signs of decreased platelets or bleeding - bruising, pinpoint red spots on the skin, black, tarry stools, blood in the urine -signs of decreased red blood cells - unusually weak or tired, fainting spells, lightheadedness -breathing problems -changes in vision -mouth or throat sores or ulcers -pain, redness, swelling or irritation at the injection site -pain, tingling, numbness in the hands or feet -redness, blistering, peeling or loosening of the skin, including inside the mouth -seizures -vomiting Side effects that usually do not require medical attention (report to your doctor or health care professional if they continue or are bothersome): -diarrhea -hair loss -loss of appetite -nausea -stomach pain This list may not describe all possible side effects. Call your doctor for medical advice about side effects. You may report side effects to FDA at 1-800-FDA-1088. Where should I keep my medicine? This drug is given in a hospital or clinic and will not be stored at home. NOTE: This sheet is a summary. It may not cover all possible information. If you have questions about this medicine, talk to your doctor, pharmacist, or health care provider.   2014, [employer] Standard. (2008-03-09 17:24:12) Bleomycin injection What is this medicine? BLEOMYCIN (blee oh MYE sin) is a chemotherapy drug. It is used to treat many kinds of cancer like lymphoma, cervical cancer, head and neck cancer, and testicular cancer. It is also used to prevent and to treat fluid build-up around the lungs caused by some cancers. This medicine may be used for other purposes; ask your health care provider or pharmacist if you have questions. COMMON BRAND NAME(S): Blenoxane What should I tell my health care provider before I take this medicine? They need to know if you have any of these conditions: -cigarette smoker -kidney disease -lung disease -recent or ongoing radiation therapy -an unusual or allergic reaction to bleomycin, other chemotherapy agents, other medicines, foods, dyes, or preservatives -pregnant or trying to get pregnant -breast-feeding How should I use this medicine? This drug is given as an infusion into a vein or a body cavity. It can also be given as an injection into a muscle or under the skin. It is administered in a hospital or clinic by a specially trained health care professional. Talk to your pediatrician regarding the use of this medicine in children. Special care may be needed. Overdosage: If you think you have taken too much of this medicine contact a poison control center or emergency room at once. NOTE: This medicine is only for you. Do not share this medicine with others. What if I miss a dose? It is important not to miss your dose. Call your doctor or health care professional if you are unable to keep an appointment. What may interact with this medicine? -certain antibiotics given by injection -cisplatin -cyclosporine -diuretics -foscarnet -medicines to increase blood counts like filgrastim, pegfilgrastim, sargramostim -vaccines This list may not describe all possible interactions. Give your health care provider a list of all  the medicines, herbs, non-prescription drugs, or dietary supplements you use. Also tell them if you smoke, drink alcohol, or use illegal drugs. Some items may interact with your medicine. What should I  watch for while using this medicine? Visit your doctor for checks on your progress. This drug may make you feel generally unwell. This is not uncommon, as chemotherapy can affect healthy cells as well as cancer cells. Report any side effects. Continue your course of treatment even though you feel ill unless your doctor tells you to stop. Call your doctor or health care professional for advice if you get a fever, chills or sore throat, or other symptoms of a cold or flu. Do not treat yourself. This drug decreases your body's ability to fight infections. Try to avoid being around people who are sick. Avoid taking products that contain aspirin, acetaminophen, ibuprofen, naproxen, or ketoprofen unless instructed by your doctor. These medicines may hide a fever. Do not become pregnant while taking this medicine. Women should inform their doctor if they wish to become pregnant or think they might be pregnant. There is a potential for serious side effects to an unborn child. Talk to your health care professional or pharmacist for more information. Do not breast-feed an infant while taking this medicine. There is a maximum amount of this medicine you should receive throughout your life. The amount depends on the medical condition being treated and your overall health. Your doctor will watch how much of this medicine you receive in your lifetime. Tell your doctor if you have taken this medicine before. What side effects may I notice from receiving this medicine? Side effects that you should report to your doctor or health care professional as soon as possible: -allergic reactions like skin rash, itching or hives, swelling of the face, lips, or tongue -breathing problems -chest pain -confusion -cough -fast,  irregular heartbeat -feeling faint or lightheaded, falls -fever or chills -mouth sores -pain, tingling, numbness in the hands or feet -trouble passing urine or change in the amount of urine -yellowing of the eyes or skin Side effects that usually do not require medical attention (report to your doctor or health care professional if they continue or are bothersome): -darker skin color -hair loss -irritation at site where injected -loss of appetite -nail changes -nausea and vomiting -weight loss This list may not describe all possible side effects. Call your doctor for medical advice about side effects. You may report side effects to FDA at 1-800-FDA-1088. Where should I keep my medicine? This drug is given in a hospital or clinic and will not be stored at home. NOTE: This sheet is a summary. It may not cover all possible information. If you have questions about this medicine, talk to your doctor, pharmacist, or health care provider.  2014, [employer]. (2013-03-04 09:36:48) Dexamethasone injection What is this medicine? DEXAMETHASONE (dex a METH a sone) is a corticosteroid. It is used to treat inflammation of the skin, joints, lungs, and other organs. Common conditions treated include asthma, allergies, and arthritis. It is also used for other conditions, like blood disorders and diseases of the adrenal glands. This medicine may be used for other purposes; ask your health care provider or pharmacist if you have questions. COMMON BRAND NAME(S): Decadron, Solurex What should I tell my health care provider before I take this medicine? They need to know if you have any of these conditions: -blood clotting problems -Cushing's syndrome -diabetes -glaucoma -heart problems or disease -high blood pressure -infection like herpes, measles, tuberculosis, or chickenpox -kidney disease -liver disease -mental problems -myasthenia gravis -osteoporosis -previous heart  attack -seizures -stomach, ulcer or intestine disease including colitis and diverticulitis -thyroid problem -an unusual or allergic  reaction to dexamethasone, corticosteroids, other medicines, lactose, foods, dyes, or preservatives -pregnant or trying to get pregnant -breast-feeding How should I use this medicine? This medicine is for injection into a muscle, joint, lesion, soft tissue, or vein. It is given by a health care professional in a hospital or clinic setting. Talk to your pediatrician regarding the use of this medicine in children. Special care may be needed. Overdosage: If you think you have taken too much of this medicine contact a poison control center or emergency room at once. NOTE: This medicine is only for you. Do not share this medicine with others. What if I miss a dose? This may not apply. If you are having a series of injections over a prolonged period, try not to miss an appointment. Call your doctor or health care professional to reschedule if you are unable to keep an appointment. What may interact with this medicine? Do not take this medicine with any of the following medications: -mifepristone, RU-486 -vaccines This medicine may also interact with the following medications: -amphotericin B -antibiotics like clarithromycin, erythromycin, and troleandomycin -aspirin and aspirin-like drugs -barbiturates like phenobarbital -carbamazepine -cholestyramine -cholinesterase inhibitors like donepezil, galantamine, rivastigmine, and tacrine -cyclosporine -digoxin -diuretics -ephedrine -male hormones, like estrogens or progestins and birth control pills -indinavir -isoniazid -ketoconazole -medicines for diabetes -medicines that improve muscle tone or strength for conditions like myasthenia gravis -NSAIDs, medicines for pain and inflammation, like ibuprofen or naproxen -phenytoin -rifampin -thalidomide -warfarin This list may not describe all possible  interactions. Give your health care provider a list of all the medicines, herbs, non-prescription drugs, or dietary supplements you use. Also tell them if you smoke, drink alcohol, or use illegal drugs. Some items may interact with your medicine. What should I watch for while using this medicine? Your condition will be monitored carefully while you are receiving this medicine. If you are taking this medicine for a long time, carry an identification card with your name and address, the type and dose of your medicine, and your doctor's name and address. This medicine may increase your risk of getting an infection. Stay away from people who are sick. Tell your doctor or health care professional if you are around anyone with measles or chickenpox. Talk to your health care provider before you get any vaccines that you take this medicine. If you are going to have surgery, tell your doctor or health care professional that you have taken this medicine within the last twelve months. Ask your doctor or health care professional about your diet. You may need to lower the amount of salt you eat. The medicine can increase your blood sugar. If you are a diabetic check with your doctor if you need help adjusting the dose of your diabetic medicine. What side effects may I notice from receiving this medicine? Side effects that you should report to your doctor or health care professional as soon as possible: -allergic reactions like skin rash, itching or hives, swelling of the face, lips, or tongue -black or tarry stools -change in the amount of urine -changes in vision -confusion, excitement, restlessness, a false sense of well-being -fever, sore throat, sneezing, cough, or other signs of infection, wounds that will not heal -hallucinations -increased thirst -mental depression, mood swings, mistaken feelings of self importance or of being mistreated -pain in hips, back, ribs, arms, shoulders, or legs -pain,  redness, or irritation at the injection site -redness, blistering, peeling or loosening of the skin, including inside the mouth -rounding out of face -  swelling of feet or lower legs -unusual bleeding or bruising -unusual tired or weak -wounds that do not heal Side effects that usually do not require medical attention (report to your doctor or health care professional if they continue or are bothersome): -diarrhea or constipation -change in taste -headache -nausea, vomiting -skin problems, acne, thin and shiny skin -touble sleeping -unusual growth of hair on the face or body -weight gain This list may not describe all possible side effects. Call your doctor for medical advice about side effects. You may report side effects to FDA at 1-800-FDA-1088. Where should I keep my medicine? This drug is given in a hospital or clinic and will not be stored at home. NOTE: This sheet is a summary. It may not cover all possible information. If you have questions about this medicine, talk to your doctor, pharmacist, or health care provider.  2014, [employer] Standard. (2008-02-27 14:04:12) Palonosetron Injection What is this medicine? PALONOSETRON (pal oh NOE se tron) is used to prevent nausea and vomiting caused by chemotherapy. It also helps prevent delayed nausea and vomiting that may occur a few days after your treatment. This medicine may be used for other purposes; ask your health care provider or pharmacist if you have questions. COMMON BRAND NAME(S): Aloxi What should I tell my health care provider before I take this medicine? They need to know if you have any of these conditions: -irregular heart rhythm -an unusual or allergic reaction to palonosetron, dolasetron, granisetron, ondansetron, other medicines, foods, dyes, or preservatives -pregnant or trying to get pregnant -breast-feeding How should I use this medicine? This medicine is for infusion into a vein. It is given by a health  care professional in a hospital or clinic setting. Talk to your pediatrician regarding the use of this medicine in children. While this drug may be prescribed for children as young as 1 month for selected conditions, precautions do apply. Overdosage: If you think you have taken too much of this medicine contact a poison control center or emergency room at once. NOTE: This medicine is only for you. Do not share this medicine with others. What if I miss a dose? This does not apply. What may interact with this medicine? Do not take this medicine with any of the following medications: -cisapride -dofetlide -dronedarone -droperidol -fluconazole -pimozide -posaconazole -thioridazine -ziprasidone This list may not describe all possible interactions. Give your health care provider a list of all the medicines, herbs, non-prescription drugs, or dietary supplements you use. Also tell them if you smoke, drink alcohol, or use illegal drugs. Some items may interact with your medicine. What should I watch for while using this medicine? Your condition will be monitored carefully while you are receiving this medicine. What side effects may I notice from receiving this medicine? Side effects that you should report to your doctor or health care professional as soon as possible: -allergic reactions like skin rash, itching or hives, swelling of the face, lips, or tongue -breathing problems -fast or irregular heartbeat -fever and chills -swelling of the hands and feet -tightness in the chest Side effects that usually do not require medical attention (report to your doctor or health care professional if they continue or are bothersome): -constipation or diarrhea -dizziness -headache This list may not describe all possible side effects. Call your doctor for medical advice about side effects. You may report side effects to FDA at 1-800-FDA-1088. Where should I keep my medicine? This drug is given in a  hospital or clinic and will  not be stored at home. NOTE: This sheet is a summary. It may not cover all possible information. If you have questions about this medicine, talk to your doctor, pharmacist, or health care provider.  2014, [employer] Standard. (2013-04-17 16:58:24) Fosaprepitant injection What is this medicine? FOSAPREPITANT (fos ap RE pi tant) is used together with other medicines to prevent nausea and vomiting caused by cancer treatment (chemotherapy). This medicine may be used for other purposes; ask your health care provider or pharmacist if you have questions. COMMON BRAND NAME(S): Emend What should I tell my health care provider before I take this medicine? They need to know if you have any of these conditions: -liver disease -an unusual or allergic reaction to fosaprepitant, aprepitant, medicines, foods, dyes, or preservatives -pregnant or trying to get pregnant -breast-feeding How should I use this medicine? This medicine is for injection into a vein. It is given by a health care professional in a hospital or clinic setting. Talk to your pediatrician regarding the use of this medicine in children. Special care may be needed. Overdosage: If you think you have taken too much of this medicine contact a poison control center or emergency room at once. NOTE: This medicine is only for you. Do not share this medicine with others. What if I miss a dose? This does not apply. What may interact with this medicine? Do not take this medicine with any of these medicines: -cisapride -pimozide -ranolazine This medicine may also interact with the following medications: -diltiazem -male hormones, like estrogens or progestins and birth control pills -medicines for fungal infections like ketoconazole and itraconazole -medicines for HIV -medicines for seizures or to control epilepsy like carbamazepine or phenytoin -medicines used for sleep or anxiety disorders like alprazolam,  diazepam, or midazolam -nefazodone -paroxetine -rifampin -some chemotherapy medications like etoposide, ifosfamide, vinblastine, vincristine -some antibiotics like clarithromycin, erythromycin, troleandomycin -steroid medicines like dexamethasone or methylprednisolone -tolbutamide -warfarin This list may not describe all possible interactions. Give your health care provider a list of all the medicines, herbs, non-prescription drugs, or dietary supplements you use. Also tell them if you smoke, drink alcohol, or use illegal drugs. Some items may interact with your medicine. What should I watch for while using this medicine? Do not take this medicine if you already have nausea and vomiting. Ask your health care provider what to do if you already have nausea. Birth control pills may not work properly while you are taking this medicine. Talk to your doctor about using an extra method of birth control. This medicine should not be used continuously for a long time. Visit your doctor or health care professional for regular check-ups. This medicine may change your liver function blood test results. What side effects may I notice from receiving this medicine? Side effects that you should report to your doctor or health care professional as soon as possible: -allergic reactions like skin rash, itching or hives, swelling of the face, lips, or tongue -breathing problems -changes in heart rhythm -high or low blood pressure -pain, redness, or irritation at site where injected -rectal bleeding -serious dizziness or disorientation, confusion -sharp or severe stomach pain -sharp pain in your leg Side effects that usually do not require medical attention (report to your doctor or health care professional if they continue or are bothersome): -constipation or diarrhea -hair loss -headache -hiccups -loss of appetite -nausea -upset stomach -tiredness This list may not describe all possible side effects.  Call your doctor for medical advice about side effects. You may report  side effects to FDA at 1-800-FDA-1088. Where should I keep my medicine? This drug is given in a hospital or clinic and will not be stored at home. NOTE: This sheet is a summary. It may not cover all possible information. If you have questions about this medicine, talk to your doctor, pharmacist, or health care provider.  2014, [employer] Standard. (2009-10-19 12:46:13) Metoclopramide tablets What is this medicine? METOCLOPRAMIDE (met oh kloe PRA mide) is used to treat the symptoms of gastroesophageal reflux disease (GERD) like heartburn. It is also used to treat people with slow emptying of the stomach and intestinal tract. This medicine may be used for other purposes; ask your health care provider or pharmacist if you have questions. COMMON BRAND NAME(S): Reglan What should I tell my health care provider before I take this medicine? They need to know if you have any of these conditions: -breast cancer -depression -diabetes -heart failure -high blood pressure -kidney disease -liver disease -Parkinson's disease or a movement disorder -pheochromocytoma -seizures -stomach obstruction, bleeding, or perforation -an unusual or allergic reaction to metoclopramide, procainamide, sulfites, other medicines, foods, dyes, or preservatives -pregnant or trying to get pregnant -breast-feeding How should I use this medicine? Take this medicine by mouth with a glass of water. Follow the directions on the prescription label. Take this medicine on an empty stomach, about 30 minutes before eating. Take your doses at regular intervals. Do not take your medicine more often than directed. Do not stop taking except on the advice of your doctor or health care professional. A special MedGuide will be given to you by the pharmacist with each prescription and refill. Be sure to read this information carefully each time. Talk to your  pediatrician regarding the use of this medicine in children. Special care may be needed. Overdosage: If you think you have taken too much of this medicine contact a poison control center or emergency room at once. NOTE: This medicine is only for you. Do not share this medicine with others. What if I miss a dose? If you miss a dose, take it as soon as you can. If it is almost time for your next dose, take only that dose. Do not take double or extra doses. What may interact with this medicine? -acetaminophen -cyclosporine -digoxin -medicines for blood pressure -medicines for diabetes, including insulin -medicines for hay fever and other allergies -medicines for depression, especially an Monoamine Oxidase Inhibitor (MAOI) -medicines for Parkinson's disease, like levodopa -medicines for sleep or for pain -tetracycline This list may not describe all possible interactions. Give your health care provider a list of all the medicines, herbs, non-prescription drugs, or dietary supplements you use. Also tell them if you smoke, drink alcohol, or use illegal drugs. Some items may interact with your medicine. What should I watch for while using this medicine? It may take a few weeks for your stomach condition to start to get better. However, do not take this medicine for longer than 12 weeks. The longer you take this medicine, and the more you take it, the greater your chances are of developing serious side effects. If you are an elderly patient, a male patient, or you have diabetes, you may be at an increased risk for side effects from this medicine. Contact your doctor immediately if you start having movements you cannot control such as lip smacking, rapid movements of the tongue, involuntary or uncontrollable movements of the eyes, head, arms and legs, or muscle twitches and spasms. Patients and their families should watch  out for worsening depression or thoughts of suicide. Also watch out for any sudden  or severe changes in feelings such as feeling anxious, agitated, panicky, irritable, hostile, aggressive, impulsive, severely restless, overly excited and hyperactive, or not being able to sleep. If this happens, especially at the beginning of treatment or after a change in dose, call your doctor. Do not treat yourself for high fever. Ask your doctor or health care professional for advice. You may get drowsy or dizzy. Do not drive, use machinery, or do anything that needs mental alertness until you know how this drug affects you. Do not stand or sit up quickly, especially if you are an older patient. This reduces the risk of dizzy or fainting spells. Alcohol can make you more drowsy and dizzy. Avoid alcoholic drinks. What side effects may I notice from receiving this medicine? Side effects that you should report to your doctor or health care professional as soon as possible: -allergic reactions like skin rash, itching or hives, swelling of the face, lips, or tongue -abnormal production of milk in females -breast enlargement in both males and females -change in the way you walk -difficulty moving, speaking or swallowing -drooling, lip smacking, or rapid movements of the tongue -excessive sweating -fever -involuntary or uncontrollable movements of the eyes, head, arms and legs -irregular heartbeat or palpitations -muscle twitches and spasms -unusually weak or tired Side effects that usually do not require medical attention (report to your doctor or health care professional if they continue or are bothersome): -change in sex drive or performance -depressed mood -diarrhea -difficulty sleeping -headache -menstrual changes -restless or nervous This list may not describe all possible side effects. Call your doctor for medical advice about side effects. You may report side effects to FDA at 1-800-FDA-1088. Where should I keep my medicine? Keep out of the reach of children. Store at room  temperature between 20 and 25 degrees C (68 and 77 degrees F). Protect from light. Keep container tightly closed. Throw away any unused medicine after the expiration date. NOTE: This sheet is a summary. It may not cover all possible information. If you have questions about this medicine, talk to your doctor, pharmacist, or health care provider.  2014, [employer] Standard. (2012-03-05 13:04:38) Prochlorperazine tablets What is this medicine? PROCHLORPERAZINE (proe klor PER a zeen) helps to control severe nausea and vomiting. This medicine is also used to treat schizophrenia. It can also help patients who experience anxiety that is not due to psychological illness. This medicine may be used for other purposes; ask your health care provider or pharmacist if you have questions. COMMON BRAND NAME(S): Compazine What should I tell my health care provider before I take this medicine? They need to know if you have any of these conditions: -blood disorders or disease -dementia -liver disease or jaundice -Parkinson's disease -uncontrollable movement disorder -an unusual or allergic reaction to prochlorperazine, other medicines, foods, dyes, or preservatives -pregnant or trying to get pregnant -breast-feeding How should I use this medicine? Take this medicine by mouth with a glass of water. Follow the directions on the prescription label. Take your doses at regular intervals. Do not take your medicine more often than directed. Do not stop taking this medicine suddenly. This can cause nausea, vomiting, and dizziness. Ask your doctor or health care professional for advice. Talk to your pediatrician regarding the use of this medicine in children. Special care may be needed. While this drug may be prescribed for children as young as 2 years for selected  conditions, precautions do apply. Overdosage: If you think you have taken too much of this medicine contact a poison control center or emergency room at  once. NOTE: This medicine is only for you. Do not share this medicine with others. What if I miss a dose? If you miss a dose, take it as soon as you can. If it is almost time for your next dose, take only that dose. Do not take double or extra doses. What may interact with this medicine? Do not take this medicine with any of the following medications: -amoxapine -antidepressants like citalopram, escitalopram, fluoxetine, paroxetine, and sertraline -deferoxamine -dofetilide -maprotiline -tricyclic antidepressants like amitriptyline, clomipramine, imipramine, nortiptyline and others This medicine may also interact with the following medications: -lithium -medicines for pain -phenytoin -propranolol -warfarin This list may not describe all possible interactions. Give your health care provider a list of all the medicines, herbs, non-prescription drugs, or dietary supplements you use. Also tell them if you smoke, drink alcohol, or use illegal drugs. Some items may interact with your medicine. What should I watch for while using this medicine? Visit your doctor or health care professional for regular checks on your progress. You may get drowsy or dizzy. Do not drive, use machinery, or do anything that needs mental alertness until you know how this medicine affects you. Do not stand or sit up quickly, especially if you are an older patient. This reduces the risk of dizzy or fainting spells. Alcohol may interfere with the effect of this medicine. Avoid alcoholic drinks. This medicine can reduce the response of your body to heat or cold. Dress warm in cold weather and stay hydrated in hot weather. If possible, avoid extreme temperatures like saunas, hot tubs, very hot or cold showers, or activities that can cause dehydration such as vigorous exercise. This medicine can make you more sensitive to the sun. Keep out of the sun. If you cannot avoid being in the sun, wear protective clothing and use  sunscreen. Do not use sun lamps or tanning beds/booths. Your mouth may get dry. Chewing sugarless gum or sucking hard candy, and drinking plenty of water may help. Contact your doctor if the problem does not go away or is severe. What side effects may I notice from receiving this medicine? Side effects that you should report to your doctor or health care professional as soon as possible: -blurred vision -breast enlargement in men or women -breast milk in women who are not breast-feeding -chest pain, fast or irregular heartbeat -confusion, restlessness -dark yellow or brown urine -difficulty breathing or swallowing -dizziness or fainting spells -drooling, shaking, movement difficulty (shuffling walk) or rigidity -fever, chills, sore throat -involuntary or uncontrollable movements of the eyes, mouth, head, arms, and legs -seizures -stomach area pain -unusually weak or tired -unusual bleeding or bruising -yellowing of skin or eyes Side effects that usually do not require medical attention (report to your doctor or health care professional if they continue or are bothersome): -difficulty passing urine -difficulty sleeping -headache -sexual dysfunction -skin rash, or itching This list may not describe all possible side effects. Call your doctor for medical advice about side effects. You may report side effects to FDA at 1-800-FDA-1088. Where should I keep my medicine? Keep out of the reach of children. Store at room temperature between 15 and 30 degrees C (59 and 86 degrees F). Protect from light. Throw away any unused medicine after the expiration date. NOTE: This sheet is a summary. It may not cover all possible information.  If you have questions about this medicine, talk to your doctor, pharmacist, or health care provider.  2014, [employer]. (2012-03-26 16:59:39) Lidocaine; Prilocaine cream What is this medicine? LIDOCAINE; PRILOCAINE (LYE doe kane; PRIL oh kane) is a  topical anesthetic that causes loss of feeling in the skin and surrounding tissues. It is used to numb the skin before procedures or injections. This medicine may be used for other purposes; ask your health care provider or pharmacist if you have questions. COMMON BRAND NAME(S): EMLA What should I tell my health care provider before I take this medicine? They need to know if you have any of these conditions: -glucose-6-phosphate deficiencies -heart disease -kidney or liver disease -methemoglobinemia -an unusual or allergic reaction to lidocaine, prilocaine, other medicines, foods, dyes, or preservatives -pregnant or trying to get pregnant -breast-feeding How should I use this medicine? This medicine is for external use only on the skin. Do not take by mouth. Follow the directions on the prescription label. Wash hands before and after use. Do not use more or leave in contact with the skin longer than directed. Do not apply to eyes or open wounds. It can cause irritation and blurred or temporary loss of vision. If this medicine comes in contact with your eyes, immediately rinse the eye with water. Do not touch or rub the eye. Contact your health care provider right away. Talk to your pediatrician regarding the use of this medicine in children. While this medicine may be prescribed for children for selected conditions, precautions do apply. Overdosage: If you think you have taken too much of this medicine contact a poison control center or emergency room at once. NOTE: This medicine is only for you. Do not share this medicine with others. What if I miss a dose? This medicine is usually only applied once prior to each procedure. It must be in contact with the skin for a period of time for it to work. If you applied this medicine later than directed, tell your health care professional before starting the procedure. What may interact with this  medicine? -acetaminophen -chloroquine -dapsone -medicines to control heart rhythm -nitrates like nitroglycerin and nitroprusside -other ointments, creams, or sprays that may contain anesthetic medicine -phenobarbital -phenytoin -quinine -sulfonamides like sulfacetamide, sulfamethoxazole, sulfasalazine and others This list may not describe all possible interactions. Give your health care provider a list of all the medicines, herbs, non-prescription drugs, or dietary supplements you use. Also tell them if you smoke, drink alcohol, or use illegal drugs. Some items may interact with your medicine. What should I watch for while using this medicine? Be careful to avoid injury to the treated area while it is numb and you are not aware of pain. Avoid scratching, rubbing, or exposing the treated area to hot or cold temperatures until complete sensation has returned. The numb feeling will wear off a few hours after applying the cream. What side effects may I notice from receiving this medicine? Side effects that you should report to your doctor or health care professional as soon as possible: -blurred vision -chest pain -difficulty breathing -dizziness -drowsiness -fast or irregular heartbeat -skin rash or itching -swelling of your throat, lips, or face -trembling Side effects that usually do not require medical attention (report to your doctor or health care professional if they continue or are bothersome): -changes in ability to feel hot or cold -redness and swelling at the application site This list may not describe all possible side effects. Call your doctor for medical advice  about side effects. You may report side effects to FDA at 1-800-FDA-1088. Where should I keep my medicine? Keep out of reach of children. Store at room temperature between 15 and 30 degrees C (59 and 86 degrees F). Keep container tightly closed. Throw away any unused medicine after the expiration date. NOTE: This  sheet is a summary. It may not cover all possible information. If you have questions about this medicine, talk to your doctor, pharmacist, or health care provider.  2014, [employer]. (2008-05-11 17:14:35)

## 2013-11-03 NOTE — H&P (Signed)
Devon Bailey is an 22 y.o. male.   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  Past Medical History  Diagnosis Date  . Anxiety   . Seasonal allergies   . Mentally challenged     mother states mentally "slow"  . Autistic disorder     Past Surgical History  Procedure Laterality Date  . Orchiectomy Left 08/07/2013    Procedure: RADICAL ORCHIECTOMY;  Surgeon: Ky Barban, MD;  Location: [facility];  Service: Urology;  Laterality: Left;  . Radical orchiectomy Left 08/07/2013    History reviewed. No pertinent family history. Social History:  reports that he has never smoked. He does not have any smokeless tobacco history on file. He reports that he does not drink alcohol or use illicit drugs.  Allergies:  Allergies  Allergen Reactions  . Penicillins Hives and Nausea And Vomiting    Current outpatient prescriptions:acetaminophen (TYLENOL) 325 MG tablet, Take 650 mg by mouth. On the days of Bleomycin chemo, start taking 650mg  in the am and continue every 6 hours x 48 hours. This is to prevent hyperthermia reaction to the bleomycin per Dr. Zigmund Daniel., Disp: , Rfl: ;  acetaminophen (TYLENOL) 500 MG tablet, Take 500 mg by mouth every 6 (six) hours as needed for mild pain or moderate pain. , Disp: , Rfl:  Current facility-administered medications:0.9 %  sodium chloride infusion, , Intravenous, Continuous, Brayton El, PA-C, Last Rate: 10 mL/hr at 11/03/13 0947;  vancomycin (VANCOCIN) IVPB 1000 mg/200 mL premix, 1,000 mg, Intravenous, Once, Brayton El, PA-C   Results for orders placed during the hospital encounter of 11/03/13 (from the past 48 hour(s))  CBC WITH DIFFERENTIAL     Status: Abnormal   Collection Time    11/03/13  9:45 AM      Result Value Range   WBC 8.2  4.0 - 10.5 K/uL   RBC 4.94  4.22 - 5.81 MIL/uL   Hemoglobin  11.9 (*) 13.0 - 17.0 g/dL   HCT 95.6 (*) 21.3 - 08.6 %   MCV 74.7 (*) 78.0 - 100.0 fL   MCH 24.1 (*) 26.0 - 34.0 pg   MCHC 32.2  30.0 - 36.0 g/dL   RDW 57.8  46.9 - 62.9 %   Platelets 306  150 - 400 K/uL   Neutrophils Relative % 72  43 - 77 %   Neutro Abs 5.9  1.7 - 7.7 K/uL   Lymphocytes Relative 13  12 - 46 %   Lymphs Abs 1.0  0.7 - 4.0 K/uL   Monocytes Relative 14 (*) 3 - 12 %   Monocytes Absolute 1.1 (*) 0.1 - 1.0 K/uL   Eosinophils Relative 2  0 - 5 %   Eosinophils Absolute 0.2  0.0 - 0.7 K/uL   Basophils Relative 0  0 - 1 %   Basophils Absolute 0.0  0.0 - 0.1 K/uL   No results found.  Review of Systems  Constitutional: Negative for fever and chills.  Respiratory: Negative for shortness of breath.        Occ cough  Cardiovascular: Negative for chest pain.  Gastrointestinal: Negative for nausea, vomiting and abdominal pain.  Musculoskeletal: Negative for back pain.  Neurological: Negative for headaches.  Endo/Heme/Allergies: Does not bruise/bleed easily.  Psychiatric/Behavioral: The patient is nervous/anxious.     Blood pressure 138/83, pulse 110, temperature 98.6  F (37 C), temperature source Oral, resp. rate 16, SpO2 100.00%. Physical Exam  Constitutional: He is oriented to person, place, and time. He appears well-developed and well-nourished.  Cardiovascular:  Tachy but regular rhythm  Respiratory: Effort normal.  Dim BS left base, right clear  GI: Soft. Bowel sounds are normal. There is no tenderness.  Musculoskeletal: Normal range of motion. He exhibits no edema.  Neurological: He is alert and oriented to person, place, and time.     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 Details/risks of procedure d/w pt/parents with their understanding and consent.  Jaileigh Weimer,D KEVIN 11/03/2013, 10:36 AM

## 2013-11-04 ENCOUNTER — Encounter (HOSPITAL_BASED_OUTPATIENT_CLINIC_OR_DEPARTMENT_OTHER): Payer: Medicaid Other

## 2013-11-04 ENCOUNTER — Encounter (HOSPITAL_COMMUNITY): Payer: Medicaid Other

## 2013-11-04 ENCOUNTER — Encounter (HOSPITAL_COMMUNITY): Payer: Self-pay

## 2013-11-04 VITALS — BP 126/85 | HR 113 | Temp 99.5°F | Resp 24 | Wt 191.5 lb

## 2013-11-04 DIAGNOSIS — F79 Unspecified intellectual disabilities: Secondary | ICD-10-CM

## 2013-11-04 DIAGNOSIS — C6290 Malignant neoplasm of unspecified testis, unspecified whether descended or undescended: Secondary | ICD-10-CM

## 2013-11-04 DIAGNOSIS — J310 Chronic rhinitis: Secondary | ICD-10-CM

## 2013-11-04 DIAGNOSIS — C6292 Malignant neoplasm of left testis, unspecified whether descended or undescended: Secondary | ICD-10-CM

## 2013-11-04 DIAGNOSIS — C781 Secondary malignant neoplasm of mediastinum: Secondary | ICD-10-CM

## 2013-11-04 NOTE — Progress Notes (Signed)
Chemo teaching done and consent signed for Cisplatin, Bleomycin, & Etoposide. Mother signed consent. Port placed yday. Port site assessed today. Port site WNL. Drsg changed per patient and mother's request. Drugs called into drug store yday 11/03/13.

## 2013-11-04 NOTE — Progress Notes (Signed)
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 Testicular cancer    CURRENT THERAPY: Status post left radical orchiectomy for discussion of recent PET scan.  INTERVAL HISTORY: ILYA Bailey 22 y.o. 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  His life port was inserted he is sitting still has some pain at the site. Appetite and good with no problems swallowing. He denies any fever, night sweats, back pain, but does have occasional left shoulder pain when he lies on Cipro long time. He denies any lower extremity swelling or redness, dysuria, hematuria, skin rash, headache, or seizures.  MEDICAL HISTORY: Past Medical History  Diagnosis Date  . Anxiety   . Seasonal allergies   . Mentally challenged     mother states mentally "slow"  . 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    ALLERGIES:  is allergic to penicillins.  MEDICATIONS: has a current medication list which includes the following prescription(s): acetaminophen, acetaminophen, bleomycin sulfate, cisplatin, etoposide, lidocaine-prilocaine, metoclopramide, and prochlorperazine.  SURGICAL HISTORY:  Past Surgical History  Procedure Laterality Date  . Orchiectomy Left 08/07/2013    Procedure: RADICAL ORCHIECTOMY;  Surgeon: Ky Barban, MD;  Location: [facility];  Service: Urology;  Laterality: Left;  . Radical orchiectomy Left 08/07/2013    FAMILY HISTORY: family history is not on file.  SOCIAL HISTORY:  reports that he has never smoked. He does not have any smokeless tobacco  history on file. He reports that he does not drink alcohol or use illicit drugs.  REVIEW OF SYSTEMS:  Other than that discussed above is noncontributory.  PHYSICAL EXAMINATION: ECOG PERFORMANCE STATUS: 0 - Asymptomatic  There were no vitals taken for this visit.  GENERAL:alert, no distress and comfortable SKIN: skin color, texture, turgor are normal, no rashes or significant lesions EYES: PERLA; Conjunctiva are pink and non-injected, sclera clear OROPHARYNX:no exudate, no erythema on lips, buccal mucosa, or tongue. NECK: supple, thyroid normal size, non-tender, without nodularity. No masses CHEST: Right anterior chest life port in place. LYMPH:  no palpable lymphadenopathy in the cervical, axillary or inguinal LUNGS: clear to auscultation and percussion with normal breathing effort HEART: regular rate & rhythm and no murmurs. ABDOMEN:abdomen soft, non-tender and normal bowel sounds MUSCULOSKELETAL:no cyanosis of digits and no clubbing. Range of motion normal.  NEURO: alert & oriented x 3 with fluent speech, no focal motor/sensory deficits GENITALIA: Absent left testis with no inguinal adenopathy. Right testis without mass.   LABORATORY DATA: Hospital Outpatient Visit on 11/03/2013  Component Date Value Range Status  . aPTT 11/03/2013 34  24 - 37 seconds Final  . WBC 11/03/2013 8.2  4.0 - 10.5 K/uL Final  . RBC 11/03/2013 4.94  4.22 - 5.81 MIL/uL Final  . Hemoglobin 11/03/2013 11.9* 13.0 - 17.0 g/dL Final  . HCT 19/14/7829 36.9* 39.0 - 52.0 % Final  . MCV 11/03/2013 74.7* 78.0 - 100.0 fL Final  . MCH 11/03/2013 24.1* 26.0 - 34.0 pg Final  . MCHC 11/03/2013 32.2  30.0 - 36.0 g/dL Final  . RDW 56/21/3086 15.1  11.5 - 15.5 % Final  .  Platelets 11/03/2013 306  150 - 400 K/uL Final  . Neutrophils Relative % 11/03/2013 72  43 - 77 % Final  . Neutro Abs 11/03/2013 5.9  1.7 - 7.7 K/uL Final  . Lymphocytes Relative 11/03/2013 13  12 - 46 % Final  . Lymphs Abs 11/03/2013 1.0  0.7 - 4.0  K/uL Final  . Monocytes Relative 11/03/2013 14* 3 - 12 % Final  . Monocytes Absolute 11/03/2013 1.1* 0.1 - 1.0 K/uL Final  . Eosinophils Relative 11/03/2013 2  0 - 5 % Final  . Eosinophils Absolute 11/03/2013 0.2  0.0 - 0.7 K/uL Final  . Basophils Relative 11/03/2013 0  0 - 1 % Final  . Basophils Absolute 11/03/2013 0.0  0.0 - 0.1 K/uL Final  . Prothrombin Time 11/03/2013 13.7  11.6 - 15.2 seconds Final  . INR 11/03/2013 1.07  0.00 - 1.49 Final  Hospital Outpatient Visit on 10/30/2013  Component Date Value Range Status  . Glucose-Capillary 10/30/2013 82  70 - 99 mg/dL Final  Office Visit on 10/22/2013  Component Date Value Range Status  . WBC 10/22/2013 9.3  4.0 - 10.5 K/uL Final  . RBC 10/22/2013 4.88  4.22 - 5.81 MIL/uL Final  . Hemoglobin 10/22/2013 12.0* 13.0 - 17.0 g/dL Final  . HCT 62/13/0865 37.1* 39.0 - 52.0 % Final  . MCV 10/22/2013 76.0* 78.0 - 100.0 fL Final  . MCH 10/22/2013 24.6* 26.0 - 34.0 pg Final  . MCHC 10/22/2013 32.3  30.0 - 36.0 g/dL Final  . RDW 78/46/9629 14.2  11.5 - 15.5 % Final  . Platelets 10/22/2013 484* 150 - 400 K/uL Final  . Neutrophils Relative % 10/22/2013 78* 43 - 77 % Final  . Neutro Abs 10/22/2013 7.3  1.7 - 7.7 K/uL Final  . Lymphocytes Relative 10/22/2013 12  12 - 46 % Final  . Lymphs Abs 10/22/2013 1.1  0.7 - 4.0 K/uL Final  . Monocytes Relative 10/22/2013 8  3 - 12 % Final  . Monocytes Absolute 10/22/2013 0.7  0.1 - 1.0 K/uL Final  . Eosinophils Relative 10/22/2013 1  0 - 5 % Final  . Eosinophils Absolute 10/22/2013 0.1  0.0 - 0.7 K/uL Final  . Basophils Relative 10/22/2013 0  0 - 1 % Final  . Basophils Absolute 10/22/2013 0.0  0.0 - 0.1 K/uL Final  . Retic Ct Pct 10/22/2013 1.5  0.4 - 3.1 % Final  . RBC. 10/22/2013 4.88  4.22 - 5.81 MIL/uL Final  . Retic Count, Manual 10/22/2013 73.2  19.0 - 186.0 K/uL Final  . Sodium 10/22/2013 137  135 - 145 mEq/L Final  . Potassium 10/22/2013 4.1  3.5 - 5.1 mEq/L Final  . Chloride 10/22/2013 99  96 -  112 mEq/L Final  . CO2 10/22/2013 28  19 - 32 mEq/L Final  . Glucose, Bld 10/22/2013 94  70 - 99 mg/dL Final  . BUN 52/84/1324 9  6 - 23 mg/dL Final  . Creatinine, Ser 10/22/2013 0.87  0.50 - 1.35 mg/dL Final  . Calcium 40/08/2724 10.0  8.4 - 10.5 mg/dL Final  . Total Protein 10/22/2013 8.3  6.0 - 8.3 g/dL Final  . Albumin 36/64/4034 3.6  3.5 - 5.2 g/dL Final  . AST 74/25/9563 28  0 - 37 U/L Final  . ALT 10/22/2013 38  0 - 53 U/L Final  . Alkaline Phosphatase 10/22/2013 94  39 - 117 U/L Final  . Total Bilirubin 10/22/2013 0.2* 0.3 - 1.2 mg/dL Final  . GFR calc non  Af Amer 10/22/2013 >90  >90 mL/min Final  . GFR calc Af Amer 10/22/2013 >90  >90 mL/min Final   Comment: (NOTE)                          The eGFR has been calculated using the CKD EPI equation.                          This calculation has not been validated in all clinical situations.                          eGFR's persistently <90 mL/min signify possible Chronic Kidney                          Disease.  Marland Kitchen LDH 10/22/2013 802* 94 - 250 U/L Final  . Beta hCG, Tumor Marker 10/22/2013 43367.0* <5.0 mIU/mL Final   Comment: (NOTE)                             Reference Range:                               Males: <5 mIU/mL                               Females: Nonpregnant: <5 mIU/mL                           Values from different assay methods may vary. The use of this                           assay to monitor or to diagnose patients with cancer or any                           condition other than pregnancy has not been approved by the FDA                           of the manufacturer of the assay.                           This HCG assay is performed on the [employer]                           platform. Use of an alternative HCG assay provides corroboration                           potentially useful in distinguising true versus false elevations                           of HCG. To further rule out false  elevations of HCG, also                           consider measurement of HCG after pretreatment for human  anti-animal [mouse] antibody (HCG, Total, with HAMA treatment),                           order code 16109. Confirmed by repeat analysis.                          Performed at [facility]  . AFP-Tumor Marker 10/22/2013 61.7* 0.0 - 8.0 ng/mL Final   Comment: (NOTE)                          The [employer] Centaur AFP immunoassay method is used.  Results obtained                          with different assay methods or kits cannot be used interchangeably.                          AFP is a valuable aid in the management of nonseminomatous testicular                          cancer patients when used in conjunction with information available                          from the clinical evaluation and other diagnostic procedures.                          Increased serum AFP concentrations have also been observed in ataxia                          telangiectasia, hereditary tyrosinemia, primary hepatocellular                          carcinoma, teratocarcinoma, gastrointestinal tract cancers with and                          without liver metastases, and in benign hepatic conditions such as                          acute viral hepatitis, chronic active hepatitis, and cirrhosis.  This                          result cannot be interpreted as absolute evidence of the presence or                          absence of malignant disease.  This result is not interpretable in                          pregnant females.                          Performed at [facility]  . CEA 10/22/2013 <0.5  0.0 - 5.0 ng/mL Final   Performed at [facility]    PATHOLOGY: FINAL for STEPHON, WEATHERS ([id number]) Patient: Devon Bailey, Devon Bailey Collected: 08/07/2013 Client: [facility] Accession: [id number] Received: 08/07/2013 Redge Gainer  DOB: July 25, 1991 Age:  37 Gender: M Reported: 08/12/2013 [address] Patient Ph: [phone] MRN #: [id number] [place] [address] Visit #: [id number] Chart #: Phone: [phone] Fax: CC: REPORT OF SURGICAL PATHOLOGY FINAL DIAGNOSIS Diagnosis Testis, tumor, left - MIXED GERM CELL TUMOR, 14 CM. PLEASE SEE ONCOLOGY TEMPLATE FOR DETAILS. Microscopic Comment ONCOLOGY TABLE - TESTIS 1. Specimen and laterality: Left testis. 2. Tumor focality: Unifocal. 3. Macroscopic extent of tumor: Confined within testis. 4. Maximum tumor size (cm): 14 cm. 5. 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 6. Microscopic tumor extension: Limited to testis. 7. Spermatic cord and surgical margins: Negative. 8. Lymph-Vascular invasion: Present. 9. Intratubular germ cell neoplasia: N/A. 10. Lymph nodes: # examined: N/A; # positive: N/A 11. TNM code: pT2, pNX 12. Serum tumor markers: See patient's medical record 13. 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 Focal angiolymphatic invasion is present. Confirmatory immunostains were performed for CD30, beta hCG, AFP, CD117, PLAP, and Pan-Cytokeratin AE1/AE3 were performed and the immunostains highlight the different components of the tumor. Control stained appropriately. Dr Luisa Hart agrees. (HCL:ecj 08/12/2013) Abigail Miyamoto MD Pathologist, Electronic Signature (Case signed 08/12/2013) Specimen Gross and Clinical Information Specimen(s) Obtained: Testis, tumor, left 1 of 2 FINAL for BANNER, HUCKABA [phone]) Specimen Clinical Information left testicular tumor Gross Received in formalin is a 784-gram, 14.4 x 10.3 x 8.5 cm testicle, which includes a 10 cm segment of spermatic cord. The tunica vaginalis is translucent tan-pink. The tunica albuginea is smooth tan-white. The cut surface of the  testicle shows complete replacement by a firm, tan-white to pale yellow tumor. There are small cystic spaces present measuring up to 0.7 cm. The cut surface of the epididymis is unremarkable. Sections are submitted in six cassettes: A = spermatic cord margin. B - F = tumor. (GRP:ecj 08/08/2013) Stain(s) used in Diagnosis: The following stain(s) were used in diagnosing the case: Alpha-Feto Protein, CD117 (C-KIT), CK AE1AE3, Beta Human Chorionic Gonadotrophin, Placental Alkaline Phosphatase, CD 30. The control(s) stained appropriately. Disclaimer Some of these immunohistochemical stains may have been developed and the performance characteristics determined by [facility]. Some may not have been cleared or approved by the U.S. Food and Drug Administration. The FDA has determined that such clearance or approval is not necessary. This test is used for clinical purposes. It should not be regarded as investigational or for research. This laboratory is certified under the Clinical Laboratory Improvement Amendments of 1988 (CLIA-88) as qualified to perform high complexity clinical laboratory testing. Report signed out from the following location(s) Technical Component performed at [facility] [address],[place], [place] [address] CLIA: [id number]., Technical Component performed at [facility] [address], [place], [place] [address]. CLIA #: [id number], Interpretation performed at [facility] [address], [place], [place] [address]. CLIA #: [id number],  Urinalysis    Component Value Date/Time   COLORURINE YELLOW 07/05/2013 2240   APPEARANCEUR CLEAR 07/05/2013 2240   LABSPEC 1.025 07/05/2013 2240   PHURINE 8.5* 07/05/2013 2240   GLUCOSEU NEGATIVE 07/05/2013 2240   HGBUR NEGATIVE 07/05/2013 2240   BILIRUBINUR NEGATIVE 07/05/2013 2240   KETONESUR NEGATIVE 07/05/2013 2240   PROTEINUR NEGATIVE 07/05/2013 2240   UROBILINOGEN 0.2 07/05/2013 2240    NITRITE NEGATIVE 07/05/2013 2240   LEUKOCYTESUR NEGATIVE 07/05/2013 2240    RADIOGRAPHIC STUDIES: Nm Pet Image Initial (pi) Skull  Base To Thigh  10/30/2013   CLINICAL DATA:  Initial treatment strategy for testicular cancer, diagnosed as mixed germ cell tumor 08/07/2013.  EXAM: NUCLEAR MEDICINE PET SKULL BASE TO THIGH  FASTING BLOOD GLUCOSE:  Value: 82mg /dl  TECHNIQUE: 16.1 mCi W-96 FDG was injected intravenously. CT data was obtained and used for attenuation correction and anatomic localization only. (This was not acquired as a diagnostic CT examination.) Additional exam technical data entered on technologist worksheet.  COMPARISON:  Testicular ultrasound 07/05/2013. No prior diagnostic CT for comparison.  FINDINGS: NECK  No hypermetabolic lymph nodes in the neck.  CHEST  Numerous FDG avid pulmonary nodules are identified, with a dominant left lower lobe mass measuring at least 9.0 cm in maximal AP diameter, demonstrating internal FDG uptake, SUV maximum 22.0.  Bulky mediastinal FDG avid lymphadenopathy is identified including the high left paratracheal region, AP window, pretracheal station, and subcarinal station. Representative uptake within a dominant 3.5 cm subcarinal node measures SUV maximum 20.5.  Bulky left hilar FDG avid lymphadenopathy is also identified, essentially contiguous with the dominant left lower lobe pulmonary mass, representative SUV maximum 22.0.  ABDOMEN/PELVIS  There is an area of focal abnormal FDG avidity within the superior tip of the spleen, SUV maximum 5.5. Inferior to this, there is a lobulated hypodense lesion without internal FDG uptake, most likely a lymphangioma or hemangioma.  Small retroperitoneal lymph nodes are identified without abnormal internal FDG uptake. Evidence of left orchiectomy.  SKELETON  No focal hypermetabolic activity to suggest skeletal metastasis.  IMPRESSION: Multi focal abnormal FDG uptake within the thorax as above, compatible with metastatic disease.   No abnormal FDG uptake within the neck or pelvis.  Focus of abnormal FDG uptake within the spleen which is not further evaluated at this nondiagnostic CT exam, with an adjacent probable lymphangioma or hemangioma. Abdominal MRI with contrast is recommended for further evaluation.   Electronically Signed   By: Christiana Pellant M.D.   On: 10/30/2013 17:19   Ir Fluoro Guide Cv Line Right  11/03/2013   CLINICAL DATA:  22 year old with left testicular cancer.  EXAM: FLUOROSCOPIC AND ULTRASOUND GUIDED PLACEMENT OF A SUBCUTANEOUS PORT.  Physician: Rachelle Hora. Lowella Dandy, MD  MEDICATIONS AND MEDICAL HISTORY: Versed 3 mg, fentanyl 100 mcg. Vancomycin 1 g. A radiology nurse monitored the patient for moderate sedation. Vancomycin was given within two hours of incision. Vancomycin was given due to a penicillin allergy.  ANESTHESIA/SEDATION: Moderate sedation time: 47 minutes  FLUOROSCOPY TIME:  18 seconds  PROCEDURE: The risks of the procedure were explained to the patient. Informed consent was obtained. Patient was placed supine on the interventional table. Ultrasound confirmed a patent right internal jugular vein. The right chest and neck were cleaned with a skin antiseptic and a sterile drape was placed. Maximal barrier sterile technique was utilized including caps, mask, sterile gowns, sterile gloves, sterile drape, hand hygiene and skin antiseptic. The right neck was anesthetized with 1% lidocaine. Small incision was made in the right neck with a blade. Micropuncture set was placed in the right internal jugular vein with ultrasound guidance. The micropuncture wire was used for measurement purposes. The right chest was anesthetized with 1% lidocaine with epinephrine. #15 blade was used to make an incision and a subcutaneous port pocket was formed. 8 french Power Port was assembled. Subcutaneous tunnel was formed with a stiff tunneling device. The port catheter was brought through the subcutaneous tunnel. The port was placed in the  subcutaneous pocket. The micropuncture set was exchanged for  a peel-away sheath. The catheter was placed through the peel-away sheath and the tip was positioned in the lower SVC. Catheter placement was confirmed with fluoroscopy. The port was accessed and flushed with heparinized saline. The port pocket was closed using two layers of absorbable sutures and Dermabond. The vein skin site was closed using a single layer of absorbable suture and Dermabond. Sterile dressings were applied. Patient tolerated the procedure well without an immediate complication. Ultrasound and fluoroscopic images were taken and saved for this procedure.  COMPLICATIONS: None  IMPRESSION: Placement of a subcutaneous port device. The catheter tip is in the lower SVC and ready to be used.   Electronically Signed   By: Richarda Overlie M.D.   On: 11/03/2013 17:26   Ir US Guide Vasc Access Right  11/03/2013   CLINICAL DATA:  22 year old with left testicular cancer.  EXAM: FLUOROSCOPIC AND ULTRASOUND GUIDED PLACEMENT OF A SUBCUTANEOUS PORT.  Physician: Rachelle Hora. Lowella Dandy, MD  MEDICATIONS AND MEDICAL HISTORY: Versed 3 mg, fentanyl 100 mcg. Vancomycin 1 g. A radiology nurse monitored the patient for moderate sedation. Vancomycin was given within two hours of incision. Vancomycin was given due to a penicillin allergy.  ANESTHESIA/SEDATION: Moderate sedation time: 47 minutes  FLUOROSCOPY TIME:  18 seconds  PROCEDURE: The risks of the procedure were explained to the patient. Informed consent was obtained. Patient was placed supine on the interventional table. Ultrasound confirmed a patent right internal jugular vein. The right chest and neck were cleaned with a skin antiseptic and a sterile drape was placed. Maximal barrier sterile technique was utilized including caps, mask, sterile gowns, sterile gloves, sterile drape, hand hygiene and skin antiseptic. The right neck was anesthetized with 1% lidocaine. Small incision was made in the right neck with a blade.  Micropuncture set was placed in the right internal jugular vein with ultrasound guidance. The micropuncture wire was used for measurement purposes. The right chest was anesthetized with 1% lidocaine with epinephrine. #15 blade was used to make an incision and a subcutaneous port pocket was formed. 8 french Power Port was assembled. Subcutaneous tunnel was formed with a stiff tunneling device. The port catheter was brought through the subcutaneous tunnel. The port was placed in the subcutaneous pocket. The micropuncture set was exchanged for a peel-away sheath. The catheter was placed through the peel-away sheath and the tip was positioned in the lower SVC. Catheter placement was confirmed with fluoroscopy. The port was accessed and flushed with heparinized saline. The port pocket was closed using two layers of absorbable sutures and Dermabond. The vein skin site was closed using a single layer of absorbable suture and Dermabond. Sterile dressings were applied. Patient tolerated the procedure well without an immediate complication. Ultrasound and fluoroscopic images were taken and saved for this procedure.  COMPLICATIONS: None  IMPRESSION: Placement of a subcutaneous port device. The catheter tip is in the lower SVC and ready to be used.   Electronically Signed   By: Richarda Overlie M.D.   On: 11/03/2013 17:26    ASSESSMENT:  #1. 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 #2. Status post light port insertion. #3. Perennial rhinitis. #4. Mild mental retardation.   PLAN:  #1. Thyroid function tests with DLCO. #2. Bleomycin skin test. #3. Bleomycin/cisplatin/etoposide for 3 cycles monitoring alpha-fetoprotein and beta-hCG with treatment to begin on 11/24/2013   All questions were answered. The patient knows to call the clinic with any  problems, questions or concerns. We can  certainly see the patient much sooner if necessary.   I spent 25 minutes counseling the patient face to face. The total time spent in the appointment was 30 minutes.    Maurilio Lovely, MD 11/04/2013 3:55 PM

## 2013-11-06 ENCOUNTER — Ambulatory Visit (HOSPITAL_COMMUNITY)
Admission: RE | Admit: 2013-11-06 | Discharge: 2013-11-06 | Disposition: A | Discharge status: Home / Self Care | Payer: Medicaid Other | Source: Ambulatory Visit | Attending: Hematology and Oncology | Admitting: Hematology and Oncology

## 2013-11-06 DIAGNOSIS — C6290 Malignant neoplasm of unspecified testis, unspecified whether descended or undescended: Secondary | ICD-10-CM | POA: Insufficient documentation

## 2013-11-06 DIAGNOSIS — C6292 Malignant neoplasm of left testis, unspecified whether descended or undescended: Secondary | ICD-10-CM

## 2013-11-06 DIAGNOSIS — Z01818 Encounter for other preprocedural examination: Secondary | ICD-10-CM | POA: Insufficient documentation

## 2013-11-06 LAB — PULMONARY FUNCTION TEST
DL/VA % pred: 100 %
DLCO cor: 26.93 ml/min/mmHg
DLCO unc % pred: 86 %
FEF 25-75 Post: 5.31 L/sec
FEF 25-75 Pre: 3.97 L/sec
FEF2575-%Change-Post: 33 %
FEF2575-%Pred-Post: 109 %
FEV1-%Change-Post: 9 %
FEV1-%Pred-Post: 94 %
FEV1-Pre: 3.9 L
FEV1FVC-%Pred-Pre: 98 %
FEV6-%Change-Post: 5 %
FEV6-%Pred-Post: 90 %
FEV6-%Pred-Pre: 86 %
FEV6-Post: 4.93 L
FEV6-Pre: 4.68 L
FEV6FVC-%Change-Post: 0 %
FEV6FVC-%Pred-Pre: 99 %
FVC-%Pred-Post: 90 %
FVC-%Pred-Pre: 86 %
FVC-Post: 4.93 L
FVC-Pre: 4.71 L
Post FEV6/FVC ratio: 100 %
Pre FEV6/FVC Ratio: 99 %
RV % pred: 81 %
TLC % pred: 91 %
TLC: 6.11 L

## 2013-11-06 MED ORDER — ALBUTEROL SULFATE (5 MG/ML) 0.5% IN NEBU
2.5000 mg | INHALATION_SOLUTION | Freq: Once | RESPIRATORY_TRACT | Status: AC
  Administered 2013-11-06: 2.5 mg via RESPIRATORY_TRACT

## 2013-11-10 NOTE — Procedures (Cosign Needed)
NAME:  Devon Bailey, Devon Bailey               ACCOUNT NO.:  [id number]  MEDICAL RECORD NO.:  [id number]  LOCATION:  RESP                          FACILITY:  [facility]  PHYSICIAN:  Imani Fiebelkorn L. Juanetta Gosling, M.D.DATE OF BIRTH:  Jul 28, 1991  DATE OF PROCEDURE: DATE OF DISCHARGE:  11/06/2013                           PULMONARY FUNCTION TEST   Reason for pulmonary function testing is left testicular cancer. 1. Spirometry shows a moderate ventilatory defect with evidence of     airflow obstruction. 2. Lung volumes show reduction in total lung capacity which is mild. 3. DLCO is mildly reduced. 4. Airway resistance is mildly elevated confirming the presence of     airflow obstruction. 5. There is no significant improvement with inhaled bronchodilator and     in fact lung function gets worse. 6. Please see the technician's comments about the difficulty with some     of the other flow parameters.     Mitchelle Goerner L. Juanetta Gosling, M.D.     ELH/MEDQ  D:  11/09/2013  T:  11/10/2013  Job:  [id number]

## 2013-11-24 ENCOUNTER — Encounter (HOSPITAL_BASED_OUTPATIENT_CLINIC_OR_DEPARTMENT_OTHER): Payer: Medicaid Other

## 2013-11-24 ENCOUNTER — Encounter (HOSPITAL_COMMUNITY): Payer: Self-pay

## 2013-11-24 ENCOUNTER — Other Ambulatory Visit (HOSPITAL_COMMUNITY): Payer: Self-pay | Admitting: Hematology and Oncology

## 2013-11-24 ENCOUNTER — Encounter (HOSPITAL_COMMUNITY): Payer: Medicaid Other | Attending: Hematology and Oncology

## 2013-11-24 VITALS — BP 122/74 | HR 101 | Temp 98.8°F | Resp 20 | Wt 183.8 lb

## 2013-11-24 DIAGNOSIS — F79 Unspecified intellectual disabilities: Secondary | ICD-10-CM

## 2013-11-24 DIAGNOSIS — C6292 Malignant neoplasm of left testis, unspecified whether descended or undescended: Secondary | ICD-10-CM

## 2013-11-24 DIAGNOSIS — C781 Secondary malignant neoplasm of mediastinum: Secondary | ICD-10-CM | POA: Insufficient documentation

## 2013-11-24 DIAGNOSIS — Z5111 Encounter for antineoplastic chemotherapy: Secondary | ICD-10-CM

## 2013-11-24 DIAGNOSIS — C6290 Malignant neoplasm of unspecified testis, unspecified whether descended or undescended: Secondary | ICD-10-CM

## 2013-11-24 DIAGNOSIS — J310 Chronic rhinitis: Secondary | ICD-10-CM

## 2013-11-24 DIAGNOSIS — R509 Fever, unspecified: Secondary | ICD-10-CM | POA: Insufficient documentation

## 2013-11-24 LAB — CBC WITH DIFFERENTIAL/PLATELET
BASOS PCT: 0 % (ref 0–1)
Basophils Absolute: 0 10*3/uL (ref 0.0–0.1)
EOS ABS: 0.1 10*3/uL (ref 0.0–0.7)
Eosinophils Relative: 1 % (ref 0–5)
HCT: 34.3 % — ABNORMAL LOW (ref 39.0–52.0)
Hemoglobin: 10.8 g/dL — ABNORMAL LOW (ref 13.0–17.0)
Lymphocytes Relative: 13 % (ref 12–46)
Lymphs Abs: 1 10*3/uL (ref 0.7–4.0)
MCH: 23 pg — AB (ref 26.0–34.0)
MCHC: 31.5 g/dL (ref 30.0–36.0)
MCV: 73.1 fL — ABNORMAL LOW (ref 78.0–100.0)
Monocytes Absolute: 1.1 10*3/uL — ABNORMAL HIGH (ref 0.1–1.0)
Monocytes Relative: 15 % — ABNORMAL HIGH (ref 3–12)
NEUTROS PCT: 71 % (ref 43–77)
Neutro Abs: 5.5 10*3/uL (ref 1.7–7.7)
PLATELETS: 401 10*3/uL — AB (ref 150–400)
RBC: 4.69 MIL/uL (ref 4.22–5.81)
RDW: 15.2 % (ref 11.5–15.5)
WBC: 7.7 10*3/uL (ref 4.0–10.5)

## 2013-11-24 LAB — COMPREHENSIVE METABOLIC PANEL
ALK PHOS: 81 U/L (ref 39–117)
ALT: 20 U/L (ref 0–53)
AST: 22 U/L (ref 0–37)
Albumin: 3.5 g/dL (ref 3.5–5.2)
BUN: 12 mg/dL (ref 6–23)
CO2: 25 mEq/L (ref 19–32)
Calcium: 9.5 mg/dL (ref 8.4–10.5)
Chloride: 97 mEq/L (ref 96–112)
Creatinine, Ser: 0.76 mg/dL (ref 0.50–1.35)
GFR calc Af Amer: >90 mL/min (ref >90)
GFR calc non Af Amer: >90 mL/min (ref >90)
Glucose, Bld: 84 mg/dL (ref 70–99)
Potassium: 4.2 mEq/L (ref 3.7–5.3)
SODIUM: 135 meq/L — AB (ref 137–147)
TOTAL PROTEIN: 8.3 g/dL (ref 6.0–8.3)
Total Bilirubin: 0.3 mg/dL (ref 0.3–1.2)

## 2013-11-24 MED ORDER — ETOPOSIDE CHEMO INJECTION 1 GM/50ML
100.0000 mg/m2 | Freq: Once | INTRAVENOUS | Status: AC
  Administered 2013-11-24: 210 mg via INTRAVENOUS
  Filled 2013-11-24: qty 10.5

## 2013-11-24 MED ORDER — SODIUM CHLORIDE 0.9 % IV SOLN: 150.0000 mg | Freq: Once | INTRAVENOUS | Status: DC

## 2013-11-24 MED ORDER — LORAZEPAM 2 MG/ML IJ SOLN
INTRAMUSCULAR | Status: AC
  Filled 2013-11-24: qty 1

## 2013-11-24 MED ORDER — CISPLATIN CHEMO INJECTION 100MG/100ML
20.0000 mg/m2 | Freq: Once | INTRAVENOUS | Status: AC
  Administered 2013-11-24: 42 mg via INTRAVENOUS
  Filled 2013-11-24: qty 42

## 2013-11-24 MED ORDER — SODIUM CHLORIDE 0.9 % IV SOLN
Freq: Once | INTRAVENOUS | Status: AC
  Administered 2013-11-24: 10:00:00 via INTRAVENOUS

## 2013-11-24 MED ORDER — LORAZEPAM 2 MG/ML IJ SOLN: 0.5000 mg | Freq: Once | INTRAMUSCULAR | Status: AC

## 2013-11-24 MED ORDER — PALONOSETRON HCL INJECTION 0.25 MG/5ML
0.2500 mg | Freq: Once | INTRAVENOUS | Status: AC
  Administered 2013-11-24: 0.25 mg via INTRAVENOUS
  Filled 2013-11-24: qty 5

## 2013-11-24 MED ORDER — FOSAPREPITANT DIMEGLUMINE INJECTION 150 MG
Freq: Once | INTRAVENOUS | Status: AC
  Administered 2013-11-24: 11:00:00 via INTRAVENOUS
  Filled 2013-11-24: qty 5

## 2013-11-24 MED ORDER — DEXAMETHASONE SODIUM PHOSPHATE 20 MG/5ML IJ SOLN: 12.0000 mg | Freq: Once | INTRAMUSCULAR | Status: DC

## 2013-11-24 MED ORDER — POTASSIUM CHLORIDE 2 MEQ/ML IV SOLN
Freq: Once | INTRAVENOUS | Status: AC
  Administered 2013-11-24: 11:00:00 via INTRAVENOUS
  Filled 2013-11-24: qty 10

## 2013-11-24 MED ORDER — LORAZEPAM 2 MG/ML IJ SOLN
0.5000 mg | Freq: Once | INTRAMUSCULAR | Status: AC
  Administered 2013-11-24: 0.5 mg via INTRAVENOUS

## 2013-11-24 MED ORDER — HEPARIN SOD (PORK) LOCK FLUSH 100 UNIT/ML IV SOLN
500.0000 [IU] | Freq: Once | INTRAVENOUS | Status: AC
  Administered 2013-11-24: 500 [IU] via INTRAVENOUS

## 2013-11-24 MED ORDER — HEPARIN SOD (PORK) LOCK FLUSH 100 UNIT/ML IV SOLN
INTRAVENOUS | Status: AC
  Filled 2013-11-24: qty 5

## 2013-11-24 MED ORDER — SODIUM CHLORIDE 0.9 % IJ SOLN
10.0000 mL | INTRAMUSCULAR | Status: DC | PRN
  Administered 2013-11-24: 10 mL

## 2013-11-24 NOTE — Progress Notes (Signed)
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 Testis cancer    Nonseminomatous, stage IV    CURRENT THERAPY: To begin BEP chemotherapy today  INTERVAL HISTORY: Devon Bailey 23 y.o. 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  Aside from occasional cough, the patient remains asymptomatic. He denies any fever, night sweats, abdominal pain, diarrhea, constipation, lower extremity swelling or redness, fever, night sweats, skin rash, headache, or seizures.  MEDICAL HISTORY: Past Medical History  Diagnosis Date  . Anxiety   . Seasonal allergies   . Mentally challenged     mother states mentally "slow"  . 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    ALLERGIES:  is allergic to penicillins.  MEDICATIONS: has a current medication list which includes the following prescription(s): acetaminophen, bleomycin sulfate, cisplatin, etoposide, lidocaine-prilocaine, acetaminophen, metoclopramide, and prochlorperazine, and the following Facility-Administered Medications: CISplatin (PLATINOL) 42 mg in sodium chloride 0.9 % 250 mL chemo infusion, etoposide (VEPESID) 210 mg in sodium chloride 0.9 % 600 mL chemo infusion, and sodium chloride.  SURGICAL HISTORY:  Past Surgical History  Procedure Laterality Date  . Orchiectomy Left 08/07/2013    Procedure: RADICAL ORCHIECTOMY;  Surgeon: Marissa Nestle, MD;  Location: [facility];  Service: Urology;  Laterality: Left;  . Radical orchiectomy Left 08/07/2013    FAMILY HISTORY: family history is not on  file.  SOCIAL HISTORY:  reports that he has never smoked. He does not have any smokeless tobacco history on file. He reports that he does not drink alcohol or use illicit drugs.  REVIEW OF SYSTEMS:  Other than that discussed above is noncontributory.  PHYSICAL EXAMINATION: ECOG PERFORMANCE STATUS: 0 - Asymptomatic  There were no vitals taken for this visit.  GENERAL:alert, no distress and comfortable SKIN: skin color, texture, turgor are normal, no rashes or significant lesions EYES: PERLA; Conjunctiva are pink and non-injected, sclera clear OROPHARYNX:no exudate, no erythema on lips, buccal mucosa, or tongue. NECK: supple, thyroid normal size, non-tender, without nodularity. No masses CHEST: Normal AP diameter with light port in place. LYMPH:  no palpable lymphadenopathy in the cervical, axillary or inguinal LUNGS: clear to auscultation and percussion with normal breathing effort HEART: regular rate & rhythm and no murmurs. ABDOMEN:abdomen soft, non-tender and normal bowel sounds MUSCULOSKELETAL:no cyanosis of digits and no clubbing. Range of motion normal.  NEURO: alert & oriented x 3 with fluent speech, no focal motor/sensory deficits GENITALIA: Absent left testis. No inguinal adenopathy. No right testicle mass.   LABORATORY DATA: Infusion on 11/24/2013  Component Date Value Range Status  . WBC 11/24/2013 7.7  4.0 - 10.5 K/uL Final  . RBC 11/24/2013 4.69  4.22 - 5.81 MIL/uL Final  . Hemoglobin 11/24/2013 10.8* 13.0 - 17.0 g/dL Final  . HCT 11/24/2013 34.3* 39.0 - 52.0 % Final  . MCV 11/24/2013 73.1* 78.0 - 100.0 fL Final  . MCH 11/24/2013 23.0* 26.0 - 34.0 pg Final  . MCHC 11/24/2013 31.5  30.0 - 36.0 g/dL Final  . RDW 11/24/2013 15.2  11.5 - 15.5 % Final  . Platelets 11/24/2013 401* 150 - 400  K/uL Final  . Neutrophils Relative % 11/24/2013 71  43 - 77 % Final  . Neutro Abs 11/24/2013 5.5  1.7 - 7.7 K/uL Final  . Lymphocytes Relative 11/24/2013 13  12 - 46 % Final  .  Lymphs Abs 11/24/2013 1.0  0.7 - 4.0 K/uL Final  . Monocytes Relative 11/24/2013 15* 3 - 12 % Final  . Monocytes Absolute 11/24/2013 1.1* 0.1 - 1.0 K/uL Final  . Eosinophils Relative 11/24/2013 1  0 - 5 % Final  . Eosinophils Absolute 11/24/2013 0.1  0.0 - 0.7 K/uL Final  . Basophils Relative 11/24/2013 0  0 - 1 % Final  . Basophils Absolute 11/24/2013 0.0  0.0 - 0.1 K/uL Final  . Sodium 11/24/2013 135* 137 - 147 mEq/L Final  . Potassium 11/24/2013 4.2  3.7 - 5.3 mEq/L Final  . Chloride 11/24/2013 97  96 - 112 mEq/L Final  . CO2 11/24/2013 25  19 - 32 mEq/L Final  . Glucose, Bld 11/24/2013 84  70 - 99 mg/dL Final  . BUN 11/24/2013 12  6 - 23 mg/dL Final  . Creatinine, Ser 11/24/2013 0.76  0.50 - 1.35 mg/dL Final  . Calcium 11/24/2013 9.5  8.4 - 10.5 mg/dL Final  . Total Protein 11/24/2013 8.3  6.0 - 8.3 g/dL Final  . Albumin 11/24/2013 3.5  3.5 - 5.2 g/dL Final  . AST 11/24/2013 22  0 - 37 U/L Final  . ALT 11/24/2013 20  0 - 53 U/L Final  . Alkaline Phosphatase 11/24/2013 81  39 - 117 U/L Final  . Total Bilirubin 11/24/2013 0.3  0.3 - 1.2 mg/dL Final  . GFR calc non Af Amer 11/24/2013 >90  >90 mL/min Final  . GFR calc Af Amer 11/24/2013 >90  >90 mL/min Final   Comment: (NOTE)                          The eGFR has been calculated using the CKD EPI equation.                          This calculation has not been validated in all clinical situations.                          eGFR's persistently <90 mL/min signify possible Chronic Kidney                          Disease.  Hospital Outpatient Visit on 11/06/2013  Component Date Value Range Status  . FVC-Pre 11/06/2013 3.71   Preliminary  . FVC-%Pred-Pre 11/06/2013 68   Preliminary  . FVC-Post 11/06/2013 2.86   Preliminary  . FVC-%Pred-Post 11/06/2013 52   Preliminary  . FVC-%Change-Post 11/06/2013 -22   Preliminary  . FEV1-Pre 11/06/2013 2.65   Preliminary  . FEV1-%Pred-Pre 11/06/2013 58   Preliminary  . FEV1-Post 11/06/2013 2.22    Preliminary  . FEV1-%Pred-Post 11/06/2013 48   Preliminary  . FEV1-%Change-Post 11/06/2013 -16   Preliminary  . FEV6-Pre 11/06/2013 3.66   Preliminary  . FEV6-%Pred-Pre 11/06/2013 67   Preliminary  . FEV6-Post 11/06/2013 2.80   Preliminary  . FEV6-%Pred-Post 11/06/2013 51   Preliminary  . FEV6-%Change-Post 11/06/2013 -23   Preliminary  . Pre FEV1/FVC ratio 11/06/2013 71   Preliminary  . FEV1FVC-%Pred-Pre 11/06/2013 85   Preliminary  . Post FEV1/FVC ratio 11/06/2013 78   Preliminary  .  FEV1FVC-%Change-Post 11/06/2013 8   Preliminary  . Pre FEV6/FVC Ratio 11/06/2013 99   Preliminary  . FEV6FVC-%Pred-Pre 11/06/2013 98   Preliminary  . Post FEV6/FVC ratio 11/06/2013 100   Preliminary  . FEV6FVC-%Pred-Post 11/06/2013 99   Preliminary  . FEV6FVC-%Change-Post 11/06/2013 0   Preliminary  . FEF 25-75 Pre 11/06/2013 1.91   Preliminary  . FEF2575-%Pred-Pre 11/06/2013 39   Preliminary  . FEF 25-75 Post 11/06/2013 0.39   Preliminary  . FEF2575-%Pred-Post 11/06/2013 8   Preliminary  . FEF2575-%Change-Post 11/06/2013 -79   Preliminary  . RV 11/06/2013 1.38   Preliminary  . RV % pred 11/06/2013 100   Preliminary  . TLC 11/06/2013 4.54   Preliminary  . TLC % pred 11/06/2013 68   Preliminary  . DLCO unc 11/06/2013 22.84   Preliminary  . DLCO unc % pred 11/06/2013 73   Preliminary  . DL/VA 11/06/2013 6.01   Preliminary  . DL/VA % pred 11/06/2013 [facility] Outpatient Visit on 11/03/2013  Component Date Value Range Status  . aPTT 11/03/2013 34  24 - 37 seconds Final  . WBC 11/03/2013 8.2  4.0 - 10.5 K/uL Final  . RBC 11/03/2013 4.94  4.22 - 5.81 MIL/uL Final  . Hemoglobin 11/03/2013 11.9* 13.0 - 17.0 g/dL Final  . HCT 11/03/2013 36.9* 39.0 - 52.0 % Final  . MCV 11/03/2013 74.7* 78.0 - 100.0 fL Final  . MCH 11/03/2013 24.1* 26.0 - 34.0 pg Final  . MCHC 11/03/2013 32.2  30.0 - 36.0 g/dL Final  . RDW 11/03/2013 15.1  11.5 - 15.5 % Final  . Platelets 11/03/2013 306  150 - 400 K/uL  Final  . Neutrophils Relative % 11/03/2013 72  43 - 77 % Final  . Neutro Abs 11/03/2013 5.9  1.7 - 7.7 K/uL Final  . Lymphocytes Relative 11/03/2013 13  12 - 46 % Final  . Lymphs Abs 11/03/2013 1.0  0.7 - 4.0 K/uL Final  . Monocytes Relative 11/03/2013 14* 3 - 12 % Final  . Monocytes Absolute 11/03/2013 1.1* 0.1 - 1.0 K/uL Final  . Eosinophils Relative 11/03/2013 2  0 - 5 % Final  . Eosinophils Absolute 11/03/2013 0.2  0.0 - 0.7 K/uL Final  . Basophils Relative 11/03/2013 0  0 - 1 % Final  . Basophils Absolute 11/03/2013 0.0  0.0 - 0.1 K/uL Final  . Prothrombin Time 11/03/2013 13.7  11.6 - 15.2 seconds Final  . INR 11/03/2013 1.07  0.00 - 1.49 Final  Hospital Outpatient Visit on 10/30/2013  Component Date Value Range Status  . Glucose-Capillary 10/30/2013 82  70 - 99 mg/dL Final    PATHOLOGY: No new pathology.  Urinalysis    Component Value Date/Time   COLORURINE YELLOW 07/05/2013 2240   APPEARANCEUR CLEAR 07/05/2013 2240   LABSPEC 1.025 07/05/2013 2240   PHURINE 8.5* 07/05/2013 2240   GLUCOSEU NEGATIVE 07/05/2013 2240   HGBUR NEGATIVE 07/05/2013 [place] 07/05/2013 2240   KETONESUR NEGATIVE 07/05/2013 2240   PROTEINUR NEGATIVE 07/05/2013 2240   UROBILINOGEN 0.2 07/05/2013 2240   NITRITE NEGATIVE 07/05/2013 2240   LEUKOCYTESUR NEGATIVE 07/05/2013 2240    RADIOGRAPHIC STUDIES: Nm Pet Image Initial (pi) Skull Base To Thigh  10/30/2013   CLINICAL DATA:  Initial treatment strategy for testicular cancer, diagnosed as mixed germ cell tumor 08/07/2013.  EXAM: NUCLEAR MEDICINE PET SKULL BASE TO THIGH  FASTING BLOOD GLUCOSE:  Value: 5m/dl  TECHNIQUE: 17.6 mCi F-18 FDG was injected intravenously. CT data was  obtained and used for attenuation correction and anatomic localization only. (This was not acquired as a diagnostic CT examination.) Additional exam technical data entered on technologist worksheet.  COMPARISON:  Testicular ultrasound 07/05/2013. No prior diagnostic CT for  comparison.  FINDINGS: NECK  No hypermetabolic lymph nodes in the neck.  CHEST  Numerous FDG avid pulmonary nodules are identified, with a dominant left lower lobe mass measuring at least 9.0 cm in maximal AP diameter, demonstrating internal FDG uptake, SUV maximum 22.0.  Bulky mediastinal FDG avid lymphadenopathy is identified including the high left paratracheal region, AP window, pretracheal station, and subcarinal station. Representative uptake within a dominant 3.5 cm subcarinal node measures SUV maximum 20.5.  Bulky left hilar FDG avid lymphadenopathy is also identified, essentially contiguous with the dominant left lower lobe pulmonary mass, representative SUV maximum 22.0.  ABDOMEN/PELVIS  There is an area of focal abnormal FDG avidity within the superior tip of the spleen, SUV maximum 5.5. Inferior to this, there is a lobulated hypodense lesion without internal FDG uptake, most likely a lymphangioma or hemangioma.  Small retroperitoneal lymph nodes are identified without abnormal internal FDG uptake. Evidence of left orchiectomy.  SKELETON  No focal hypermetabolic activity to suggest skeletal metastasis.  IMPRESSION: Multi focal abnormal FDG uptake within the thorax as above, compatible with metastatic disease.  No abnormal FDG uptake within the neck or pelvis.  Focus of abnormal FDG uptake within the spleen which is not further evaluated at this nondiagnostic CT exam, with an adjacent probable lymphangioma or hemangioma. Abdominal MRI with contrast is recommended for further evaluation.   Electronically Signed   By: Conchita Paris M.D.   On: 10/30/2013 17:19   Ir Fluoro Guide Cv Line Right  11/03/2013   CLINICAL DATA:  23 year old with left testicular cancer.  EXAM: FLUOROSCOPIC AND ULTRASOUND GUIDED PLACEMENT OF A SUBCUTANEOUS PORT.  Physician: Stephan Minister. Anselm Pancoast, MD  MEDICATIONS AND MEDICAL HISTORY: Versed 3 mg, fentanyl 100 mcg. Vancomycin 1 g. A radiology nurse monitored the patient for moderate  sedation. Vancomycin was given within two hours of incision. Vancomycin was given due to a penicillin allergy.  ANESTHESIA/SEDATION: Moderate sedation time: 47 minutes  FLUOROSCOPY TIME:  18 seconds  PROCEDURE: The risks of the procedure were explained to the patient. Informed consent was obtained. Patient was placed supine on the interventional table. Ultrasound confirmed a patent right internal jugular vein. The right chest and neck were cleaned with a skin antiseptic and a sterile drape was placed. Maximal barrier sterile technique was utilized including caps, mask, sterile gowns, sterile gloves, sterile drape, hand hygiene and skin antiseptic. The right neck was anesthetized with 1% lidocaine. Small incision was made in the right neck with a blade. Micropuncture set was placed in the right internal jugular vein with ultrasound guidance. The micropuncture wire was used for measurement purposes. The right chest was anesthetized with 1% lidocaine with epinephrine. #15 blade was used to make an incision and a subcutaneous port pocket was formed. [place] was assembled. Subcutaneous tunnel was formed with a stiff tunneling device. The port catheter was brought through the subcutaneous tunnel. The port was placed in the subcutaneous pocket. The micropuncture set was exchanged for a peel-away sheath. The catheter was placed through the peel-away sheath and the tip was positioned in the lower SVC. Catheter placement was confirmed with fluoroscopy. The port was accessed and flushed with heparinized saline. The port pocket was closed using two layers of absorbable sutures and Dermabond. The vein skin  site was closed using a single layer of absorbable suture and Dermabond. Sterile dressings were applied. Patient tolerated the procedure well without an immediate complication. Ultrasound and fluoroscopic images were taken and saved for this procedure.  COMPLICATIONS: None  IMPRESSION: Placement of a subcutaneous  port device. The catheter tip is in the lower SVC and ready to be used.   Electronically Signed   By: Markus Daft M.D.   On: 11/03/2013 17:26   Ir US Guide Vasc Access Right  11/03/2013   CLINICAL DATA:  23 year old with left testicular cancer.  EXAM: FLUOROSCOPIC AND ULTRASOUND GUIDED PLACEMENT OF A SUBCUTANEOUS PORT.  Physician: Stephan Minister. Anselm Pancoast, MD  MEDICATIONS AND MEDICAL HISTORY: Versed 3 mg, fentanyl 100 mcg. Vancomycin 1 g. A radiology nurse monitored the patient for moderate sedation. Vancomycin was given within two hours of incision. Vancomycin was given due to a penicillin allergy.  ANESTHESIA/SEDATION: Moderate sedation time: 47 minutes  FLUOROSCOPY TIME:  18 seconds  PROCEDURE: The risks of the procedure were explained to the patient. Informed consent was obtained. Patient was placed supine on the interventional table. Ultrasound confirmed a patent right internal jugular vein. The right chest and neck were cleaned with a skin antiseptic and a sterile drape was placed. Maximal barrier sterile technique was utilized including caps, mask, sterile gowns, sterile gloves, sterile drape, hand hygiene and skin antiseptic. The right neck was anesthetized with 1% lidocaine. Small incision was made in the right neck with a blade. Micropuncture set was placed in the right internal jugular vein with ultrasound guidance. The micropuncture wire was used for measurement purposes. The right chest was anesthetized with 1% lidocaine with epinephrine. #15 blade was used to make an incision and a subcutaneous port pocket was formed. [place] was assembled. Subcutaneous tunnel was formed with a stiff tunneling device. The port catheter was brought through the subcutaneous tunnel. The port was placed in the subcutaneous pocket. The micropuncture set was exchanged for a peel-away sheath. The catheter was placed through the peel-away sheath and the tip was positioned in the lower SVC. Catheter placement was confirmed  with fluoroscopy. The port was accessed and flushed with heparinized saline. The port pocket was closed using two layers of absorbable sutures and Dermabond. The vein skin site was closed using a single layer of absorbable suture and Dermabond. Sterile dressings were applied. Patient tolerated the procedure well without an immediate complication. Ultrasound and fluoroscopic images were taken and saved for this procedure.  COMPLICATIONS: None  IMPRESSION: Placement of a subcutaneous port device. The catheter tip is in the lower SVC and ready to be used.   Electronically Signed   By: Markus Daft M.D.   On: 11/03/2013 17:26    ASSESSMENT:  #1. 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  #2. Status post life port insertion.  #3. Perennial rhinitis.  #4. Mild mental retardation. #5. DLCO 73% of predicted as baseline.    PLAN:  #1. Cycle 1 of BEP today. Bleomycin will be given tomorrow as a test dose intravenously per hospital policy and if acceptable, we'll receive the drug at full dose to be repeated weekly. #2. Followup with CBC in 1 week. Baseline alpha-fetoprotein and beta hCG were done today and will be closely monitored.   All questions were answered. The patient knows to call the clinic with any problems, questions or concerns. We can certainly see the patient much  sooner if necessary.   I spent 25 minutes counseling the patient face to face. The total time spent in the appointment was 30 minutes.    Doroteo Bradford, MD 11/24/2013 1:53 PM

## 2013-11-24 NOTE — Progress Notes (Signed)
At 1400 patient complained of having some pain in his chest but then stated that it was around the port site. VSS. Port site assessed and WNL. Blood return positive.

## 2013-11-25 ENCOUNTER — Encounter (HOSPITAL_BASED_OUTPATIENT_CLINIC_OR_DEPARTMENT_OTHER): Payer: Medicaid Other

## 2013-11-25 VITALS — BP 112/72 | HR 109 | Temp 98.0°F | Resp 16

## 2013-11-25 DIAGNOSIS — C781 Secondary malignant neoplasm of mediastinum: Secondary | ICD-10-CM

## 2013-11-25 DIAGNOSIS — C6292 Malignant neoplasm of left testis, unspecified whether descended or undescended: Secondary | ICD-10-CM

## 2013-11-25 DIAGNOSIS — Z5111 Encounter for antineoplastic chemotherapy: Secondary | ICD-10-CM

## 2013-11-25 DIAGNOSIS — C6290 Malignant neoplasm of unspecified testis, unspecified whether descended or undescended: Secondary | ICD-10-CM

## 2013-11-25 DIAGNOSIS — F79 Unspecified intellectual disabilities: Secondary | ICD-10-CM

## 2013-11-25 LAB — AFP TUMOR MARKER: AFP-Tumor Marker: 74.6 ng/mL — ABNORMAL HIGH (ref 0.0–8.0)

## 2013-11-25 MED ORDER — POTASSIUM CHLORIDE 2 MEQ/ML IV SOLN
Freq: Once | INTRAVENOUS | Status: AC
  Administered 2013-11-25: 11:00:00 via INTRAVENOUS
  Filled 2013-11-25: qty 10

## 2013-11-25 MED ORDER — HEPARIN SOD (PORK) LOCK FLUSH 100 UNIT/ML IV SOLN
500.0000 [IU] | Freq: Once | INTRAVENOUS | Status: AC | PRN
  Administered 2013-11-25: 500 [IU]

## 2013-11-25 MED ORDER — SODIUM CHLORIDE 0.9 % IJ SOLN
10.0000 mL | INTRAMUSCULAR | Status: DC | PRN
  Administered 2013-11-25 (×2): 10 mL

## 2013-11-25 MED ORDER — LORAZEPAM 2 MG/ML IJ SOLN
0.5000 mg | Freq: Every day | INTRAMUSCULAR | Status: DC
  Administered 2013-11-25: 0.5 mg via INTRAVENOUS

## 2013-11-25 MED ORDER — HEPARIN SOD (PORK) LOCK FLUSH 100 UNIT/ML IV SOLN
INTRAVENOUS | Status: AC
  Filled 2013-11-25: qty 5

## 2013-11-25 MED ORDER — SODIUM CHLORIDE 0.9 % IV SOLN
29.0000 [IU] | Freq: Once | INTRAVENOUS | Status: AC
  Administered 2013-11-25: 29 [IU] via INTRAVENOUS
  Filled 2013-11-25: qty 9.67

## 2013-11-25 MED ORDER — LORAZEPAM 2 MG/ML IJ SOLN
INTRAMUSCULAR | Status: AC
  Filled 2013-11-25: qty 1

## 2013-11-25 MED ORDER — SODIUM CHLORIDE 0.9 % IV SOLN
Freq: Once | INTRAVENOUS | Status: AC
  Administered 2013-11-25: 10:00:00 via INTRAVENOUS

## 2013-11-25 MED ORDER — SODIUM CHLORIDE 0.9 % IV SOLN
20.0000 mg/m2 | Freq: Once | INTRAVENOUS | Status: AC
  Administered 2013-11-25: 42 mg via INTRAVENOUS
  Filled 2013-11-25: qty 42

## 2013-11-25 MED ORDER — SODIUM CHLORIDE 0.9 % IV SOLN
1.0000 [IU] | Freq: Once | INTRAVENOUS | Status: AC
  Administered 2013-11-25: 1 [IU] via INTRAVENOUS
  Filled 2013-11-25: qty 0.33

## 2013-11-25 MED ORDER — SODIUM CHLORIDE 0.9 % IV SOLN
100.0000 mg/m2 | Freq: Once | INTRAVENOUS | Status: AC
  Administered 2013-11-25: 210 mg via INTRAVENOUS
  Filled 2013-11-25: qty 10.5

## 2013-11-25 MED ORDER — DEXAMETHASONE SODIUM PHOSPHATE 10 MG/ML IJ SOLN: 20.0000 mg | Freq: Once | INTRAMUSCULAR | Status: DC

## 2013-11-25 MED ORDER — SODIUM CHLORIDE 0.9 % IV SOLN
20.0000 mg | Freq: Once | INTRAVENOUS | Status: AC
  Administered 2013-11-25: 20 mg via INTRAVENOUS
  Filled 2013-11-25: qty 2

## 2013-11-25 NOTE — Progress Notes (Signed)
Devon Bailey tolerated infusions well and without incident, including bleomycin test dose; verbalizes understanding for follow-up.  No distress noted at time of discharge and patient was discharged home with his mother.

## 2013-11-25 NOTE — Progress Notes (Signed)
Per patient's mother she gave patient Tylenol as directed this am prior to appointment.

## 2013-11-26 ENCOUNTER — Encounter (HOSPITAL_BASED_OUTPATIENT_CLINIC_OR_DEPARTMENT_OTHER): Payer: Medicaid Other

## 2013-11-26 VITALS — BP 122/74 | HR 103 | Temp 97.8°F | Resp 16

## 2013-11-26 DIAGNOSIS — C781 Secondary malignant neoplasm of mediastinum: Secondary | ICD-10-CM

## 2013-11-26 DIAGNOSIS — C6290 Malignant neoplasm of unspecified testis, unspecified whether descended or undescended: Secondary | ICD-10-CM

## 2013-11-26 DIAGNOSIS — F79 Unspecified intellectual disabilities: Secondary | ICD-10-CM

## 2013-11-26 DIAGNOSIS — C6292 Malignant neoplasm of left testis, unspecified whether descended or undescended: Secondary | ICD-10-CM

## 2013-11-26 DIAGNOSIS — Z5111 Encounter for antineoplastic chemotherapy: Secondary | ICD-10-CM

## 2013-11-26 MED ORDER — SODIUM CHLORIDE 0.9 % IV SOLN
20.0000 mg | Freq: Once | INTRAVENOUS | Status: AC
  Administered 2013-11-26: 20 mg via INTRAVENOUS
  Filled 2013-11-26: qty 2

## 2013-11-26 MED ORDER — SODIUM CHLORIDE 0.9 % IV SOLN
Freq: Once | INTRAVENOUS | Status: AC
  Administered 2013-11-26: 11:00:00 via INTRAVENOUS

## 2013-11-26 MED ORDER — POTASSIUM CHLORIDE 2 MEQ/ML IV SOLN
Freq: Once | INTRAVENOUS | Status: AC
  Administered 2013-11-26: 11:00:00 via INTRAVENOUS
  Filled 2013-11-26: qty 10

## 2013-11-26 MED ORDER — SODIUM CHLORIDE 0.9 % IJ SOLN: 10.0000 mL | INTRAMUSCULAR | Status: DC | PRN

## 2013-11-26 MED ORDER — HEPARIN SOD (PORK) LOCK FLUSH 100 UNIT/ML IV SOLN
INTRAVENOUS | Status: AC
  Filled 2013-11-26: qty 5

## 2013-11-26 MED ORDER — SODIUM CHLORIDE 0.9 % IV SOLN
100.0000 mg/m2 | Freq: Once | INTRAVENOUS | Status: AC
  Administered 2013-11-26: 210 mg via INTRAVENOUS
  Filled 2013-11-26: qty 10.5

## 2013-11-26 MED ORDER — DEXAMETHASONE SODIUM PHOSPHATE 10 MG/ML IJ SOLN: 20.0000 mg | Freq: Once | INTRAMUSCULAR | Status: DC

## 2013-11-26 MED ORDER — PALONOSETRON HCL INJECTION 0.25 MG/5ML
INTRAVENOUS | Status: AC
  Filled 2013-11-26: qty 5

## 2013-11-26 MED ORDER — HEPARIN SOD (PORK) LOCK FLUSH 100 UNIT/ML IV SOLN
500.0000 [IU] | Freq: Once | INTRAVENOUS | Status: AC | PRN
  Administered 2013-11-26: 500 [IU]

## 2013-11-26 MED ORDER — PALONOSETRON HCL INJECTION 0.25 MG/5ML
0.2500 mg | Freq: Once | INTRAVENOUS | Status: AC
  Administered 2013-11-26: 0.25 mg via INTRAVENOUS

## 2013-11-26 MED ORDER — SODIUM CHLORIDE 0.9 % IV SOLN
20.0000 mg/m2 | Freq: Once | INTRAVENOUS | Status: AC
  Administered 2013-11-26: 42 mg via INTRAVENOUS
  Filled 2013-11-26: qty 42

## 2013-11-27 ENCOUNTER — Encounter (HOSPITAL_BASED_OUTPATIENT_CLINIC_OR_DEPARTMENT_OTHER): Payer: Medicaid Other

## 2013-11-27 VITALS — BP 128/84 | HR 74 | Temp 97.1°F | Resp 16

## 2013-11-27 DIAGNOSIS — C781 Secondary malignant neoplasm of mediastinum: Secondary | ICD-10-CM

## 2013-11-27 DIAGNOSIS — C6290 Malignant neoplasm of unspecified testis, unspecified whether descended or undescended: Secondary | ICD-10-CM

## 2013-11-27 DIAGNOSIS — C6292 Malignant neoplasm of left testis, unspecified whether descended or undescended: Secondary | ICD-10-CM

## 2013-11-27 DIAGNOSIS — F79 Unspecified intellectual disabilities: Secondary | ICD-10-CM

## 2013-11-27 DIAGNOSIS — Z5111 Encounter for antineoplastic chemotherapy: Secondary | ICD-10-CM

## 2013-11-27 LAB — BETA HCG QUANT (REF LAB): Beta hCG, Tumor Marker: 74601 m[IU]/mL — ABNORMAL HIGH (ref <5.0)

## 2013-11-27 MED ORDER — HEPARIN SOD (PORK) LOCK FLUSH 100 UNIT/ML IV SOLN
INTRAVENOUS | Status: AC
  Filled 2013-11-27: qty 5

## 2013-11-27 MED ORDER — SODIUM CHLORIDE 0.9 % IV SOLN
100.0000 mg/m2 | Freq: Once | INTRAVENOUS | Status: AC
  Administered 2013-11-27: 210 mg via INTRAVENOUS
  Filled 2013-11-27: qty 10.5

## 2013-11-27 MED ORDER — SODIUM CHLORIDE 0.9 % IV SOLN
Freq: Once | INTRAVENOUS | Status: AC
  Administered 2013-11-27: 10:00:00 via INTRAVENOUS

## 2013-11-27 MED ORDER — SODIUM CHLORIDE 0.9 % IV SOLN
20.0000 mg/m2 | Freq: Once | INTRAVENOUS | Status: AC
  Administered 2013-11-27: 42 mg via INTRAVENOUS
  Filled 2013-11-27: qty 42

## 2013-11-27 MED ORDER — SODIUM CHLORIDE 0.9 % IV SOLN
20.0000 mg | Freq: Once | INTRAVENOUS | Status: AC
  Administered 2013-11-27: 20 mg via INTRAVENOUS
  Filled 2013-11-27: qty 2

## 2013-11-27 MED ORDER — DEXAMETHASONE SODIUM PHOSPHATE 10 MG/ML IJ SOLN: 20.0000 mg | Freq: Once | INTRAMUSCULAR | Status: DC

## 2013-11-27 MED ORDER — POTASSIUM CHLORIDE 2 MEQ/ML IV SOLN
Freq: Once | INTRAVENOUS | Status: AC
  Administered 2013-11-27: 10:00:00 via INTRAVENOUS
  Filled 2013-11-27: qty 10

## 2013-11-27 MED ORDER — SODIUM CHLORIDE 0.9 % IJ SOLN
10.0000 mL | INTRAMUSCULAR | Status: DC | PRN
  Administered 2013-11-27: 10 mL

## 2013-11-27 MED ORDER — HEPARIN SOD (PORK) LOCK FLUSH 100 UNIT/ML IV SOLN
500.0000 [IU] | Freq: Once | INTRAVENOUS | Status: AC | PRN
  Administered 2013-11-27: 500 [IU]

## 2013-11-28 ENCOUNTER — Encounter (HOSPITAL_BASED_OUTPATIENT_CLINIC_OR_DEPARTMENT_OTHER): Payer: Medicaid Other

## 2013-11-28 VITALS — BP 133/79 | HR 100 | Temp 98.0°F | Resp 18

## 2013-11-28 DIAGNOSIS — Z5111 Encounter for antineoplastic chemotherapy: Secondary | ICD-10-CM

## 2013-11-28 DIAGNOSIS — C6290 Malignant neoplasm of unspecified testis, unspecified whether descended or undescended: Secondary | ICD-10-CM

## 2013-11-28 DIAGNOSIS — C6292 Malignant neoplasm of left testis, unspecified whether descended or undescended: Secondary | ICD-10-CM

## 2013-11-28 DIAGNOSIS — F79 Unspecified intellectual disabilities: Secondary | ICD-10-CM

## 2013-11-28 MED ORDER — DEXAMETHASONE SODIUM PHOSPHATE 10 MG/ML IJ SOLN
20.0000 mg | Freq: Once | INTRAMUSCULAR | Status: AC
  Administered 2013-11-28: 20 mg via INTRAVENOUS
  Filled 2013-11-28: qty 2

## 2013-11-28 MED ORDER — PALONOSETRON HCL INJECTION 0.25 MG/5ML
INTRAVENOUS | Status: AC
  Filled 2013-11-28: qty 5

## 2013-11-28 MED ORDER — SODIUM CHLORIDE 0.9 % IJ SOLN
10.0000 mL | INTRAMUSCULAR | Status: DC | PRN
Start: 2013-11-28 — End: 2013-11-28
  Administered 2013-11-28: 10 mL

## 2013-11-28 MED ORDER — DEXTROSE-NACL 5-0.45 % IV SOLN
Freq: Once | INTRAVENOUS | Status: AC
  Administered 2013-11-28: 10:00:00 via INTRAVENOUS
  Filled 2013-11-28: qty 10

## 2013-11-28 MED ORDER — HEPARIN SOD (PORK) LOCK FLUSH 100 UNIT/ML IV SOLN
500.0000 [IU] | Freq: Once | INTRAVENOUS | Status: AC | PRN
  Administered 2013-11-28: 500 [IU]
  Filled 2013-11-28: qty 5

## 2013-11-28 MED ORDER — SODIUM CHLORIDE 0.9 % IV SOLN
Freq: Once | INTRAVENOUS | Status: AC
  Administered 2013-11-28: 10:00:00 via INTRAVENOUS

## 2013-11-28 MED ORDER — DEXAMETHASONE SODIUM PHOSPHATE 10 MG/ML IJ SOLN: 20.0000 mg | Freq: Once | INTRAMUSCULAR | Status: DC

## 2013-11-28 MED ORDER — PALONOSETRON HCL INJECTION 0.25 MG/5ML
0.2500 mg | Freq: Once | INTRAVENOUS | Status: AC
  Administered 2013-11-28: 0.25 mg via INTRAVENOUS

## 2013-11-28 MED ORDER — SODIUM CHLORIDE 0.9 % IV SOLN
100.0000 mg/m2 | Freq: Once | INTRAVENOUS | Status: AC
  Administered 2013-11-28: 210 mg via INTRAVENOUS
  Filled 2013-11-28: qty 10.5

## 2013-11-28 MED ORDER — SODIUM CHLORIDE 0.9 % IV SOLN
20.0000 mg/m2 | Freq: Once | INTRAVENOUS | Status: AC
  Administered 2013-11-28: 42 mg via INTRAVENOUS
  Filled 2013-11-28: qty 42

## 2013-11-28 NOTE — Progress Notes (Signed)
Denies any complaints last pm post chemo.

## 2013-11-28 NOTE — Progress Notes (Signed)
Reports mild nausea last pm post chemo that did not require intervention. 

## 2013-11-28 NOTE — Progress Notes (Signed)
Reports mild nausea last pm post chemo that did not require intervention.

## 2013-11-28 NOTE — Progress Notes (Signed)
Denies complaints last pm post chemo.

## 2013-12-01 NOTE — Progress Notes (Signed)
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  INTERVAL HISTORY: Devon Bailey 23 y.o. 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    I personally reviewed and went over laboratory results with the patient. The results follow below.   Devon Bailey has a few complaints:  1. Non-productive cough.  He is using OTC cough drops with improvement.  He denies any fevers or chills.  He denies any sore throat or sputum production.  I recommend OTC cough suppressants such as Delsym  2. Intermittent chest pain- he has used Tylenol with benefit in the past.  He reports that it lasted x 3 days.  I will provide him an RX for Hydrocodone/APAP which he can take every 6 hours PRN pain (moderate-severe).  3. Diarrhea- he reports is better.  He has OTC Imodium at home.  He is using "baby wipes" at home and he reports they burn.  On further discussion, I discover that he is using the incorrect wipes and he is using scented wipes of his mother's.  I recommended non-scented wipe and he reports he has them at home, but has been using the wrong ones. Hopefully that solves that issue.  4. Decreased appetite- likely a side effect of chemotherapy.  No obvious weight loss identified.  I reviewed the patient PFT with him and his mother.  The results are compromised and we suspect this is secondary to his mediastinal mass, however, we will repeat during cycle 2 to verify that  bleomycin chemotherapy is not further compromising PFTs.  He otherwise, oncologically denies any complaints and ROS questioning is negative.  Past Medical History  Diagnosis Date  . Anxiety   . Seasonal allergies   . Mentally challenged     mother states mentally "slow"  . 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     is allergic to penicillins.  Devon Bailey does not currently have medications on file.  Past Surgical History  Procedure Laterality Date  . Orchiectomy Left 08/07/2013    Procedure: RADICAL ORCHIECTOMY;  Surgeon: Marissa Nestle, MD;  Location: [facility];  Service: Urology;  Laterality: Left;  . Radical orchiectomy Left 08/07/2013    Denies any headaches, dizziness, double vision, fevers, chills, night sweats, nausea, vomiting, constipation, heart palpitations, shortness of breath, blood in stool, black tarry stool, urinary pain, urinary burning, urinary frequency, hematuria.   PHYSICAL EXAMINATION  ECOG PERFORMANCE STATUS: 1 - Symptomatic but completely ambulatory  There were no vitals filed for this visit.  GENERAL:alert, no distress, well nourished, well developed, comfortable, cooperative and smiling, in chemotherapy chair with mother playing cards SKIN: skin color, texture, turgor are normal, acne of face HEAD: Normocephalic, No masses, lesions, tenderness or abnormalities EYES: normal, PERRLA, EOMI, Conjunctiva are pink and non-injected EARS: External ears normal OROPHARYNX:mucous membranes are moist  NECK: supple, trachea midline LYMPH:  not examined BREAST:not examined LUNGS: not examined HEART: not examined ABDOMEN:not examined BACK: Back symmetric, no curvature. EXTREMITIES:no edema, no clubbing, no cyanosis  NEURO:  alert & oriented x 3 with fluent speech, no focal motor/sensory deficits   LABORATORY DATA: CBC    Component Value Date/Time   WBC 6.9  12/02/2013 0953   RBC 4.60 12/02/2013 0953   RBC 4.88 10/22/2013 1530   HGB 10.6* 12/02/2013 0953   HCT 33.2* 12/02/2013 0953   PLT 205 12/02/2013 0953   MCV 72.2* 12/02/2013 0953   MCH 23.0* 12/02/2013 0953   MCHC 31.9 12/02/2013 0953   RDW 14.9 12/02/2013 0953   LYMPHSABS 0.9 12/02/2013 0953   MONOABS 0.0* 12/02/2013 0953   EOSABS 0.0 12/02/2013 0953   BASOSABS 0.0 12/02/2013 0953      Chemistry      Component Value Date/Time   NA 135* 11/24/2013 0843   K 4.2 11/24/2013 0843   CL 97 11/24/2013 0843   CO2 25 11/24/2013 0843   BUN 12 11/24/2013 0843   CREATININE 0.76 11/24/2013 0843      Component Value Date/Time   CALCIUM 9.5 11/24/2013 0843   ALKPHOS 81 11/24/2013 0843   AST 22 11/24/2013 0843   ALT 20 11/24/2013 0843   BILITOT 0.3 11/24/2013 0843     Results for Devon Bailey, Devon Bailey (MRN [id number]) as of 12/01/2013 15:55  Ref. Range 11/24/2013 08:43  AFP-Tumor Marker Latest Range: 0.0-8.0 ng/mL 74.6 (H)  Beta hCG, Tumor Marker Latest Range: <5.0 mIU/mL 74601.0 (H)      ASSESSMENT:  1. 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  2. Perennial rhinitis.  3. Mild mental retardation.  4. DLCO 73% of predicted as baseline. 5. Intermittent chest pain, improved with Tylenol 6. Diarrhea, mild 7. Cough, non-productive  Patient Active Problem List   Diagnosis Date Noted  . 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 Mentally challenged 10/23/2013  . Cancer of testis, non-seminomatous 10/22/2013     PLAN:  1. I personally reviewed and went over laboratory results with the patient. 2. We will repeat imaging following cycle 3 of chemotherapy with a PET scan.  Will order and schedule in the future. 3. Pre-chemo therapy labs:   A. CBC diff weekly  B. CBC diff, CMET, AFP, HCG 4. Repeat PFT at beginning of next cycle on 12/15/2013 5. Recommend Tylenol every 6 hours x 2 days following  Bleomycin chemotherapy to prevent bleomycin-induced pyrexia or hyperthermia. 6. Rx for Hydrocodone for moderate-sever pain 7. Recommend OTC Delsym for cough 8. Recommend OTC Imodium for diarrhea. 9. Return in 2 weeks for follow-up and then at the start of each cycle.   THERAPY PLAN:  He will continue with BEP chemotherapy as scheduled.  We will manage side effects of therapy.  All questions were answered. The patient knows to call the clinic with any problems, questions or concerns. We can certainly see the patient much sooner if necessary.  Patient and plan discussed with Dr. Farrel Gobble and he is in agreement with the aforementioned.   KEFALAS,THOMAS

## 2013-12-02 ENCOUNTER — Encounter (HOSPITAL_BASED_OUTPATIENT_CLINIC_OR_DEPARTMENT_OTHER): Payer: Medicaid Other | Admitting: Oncology

## 2013-12-02 ENCOUNTER — Encounter (HOSPITAL_BASED_OUTPATIENT_CLINIC_OR_DEPARTMENT_OTHER): Payer: Medicaid Other

## 2013-12-02 VITALS — BP 117/64 | HR 106 | Temp 97.2°F | Resp 16

## 2013-12-02 DIAGNOSIS — C781 Secondary malignant neoplasm of mediastinum: Secondary | ICD-10-CM

## 2013-12-02 DIAGNOSIS — R509 Fever, unspecified: Secondary | ICD-10-CM

## 2013-12-02 DIAGNOSIS — C6290 Malignant neoplasm of unspecified testis, unspecified whether descended or undescended: Secondary | ICD-10-CM

## 2013-12-02 DIAGNOSIS — Z5111 Encounter for antineoplastic chemotherapy: Secondary | ICD-10-CM

## 2013-12-02 DIAGNOSIS — R05 Cough: Secondary | ICD-10-CM

## 2013-12-02 DIAGNOSIS — R079 Chest pain, unspecified: Secondary | ICD-10-CM

## 2013-12-02 DIAGNOSIS — R197 Diarrhea, unspecified: Secondary | ICD-10-CM

## 2013-12-02 DIAGNOSIS — R059 Cough, unspecified: Secondary | ICD-10-CM

## 2013-12-02 DIAGNOSIS — F70 Mild intellectual disabilities: Secondary | ICD-10-CM

## 2013-12-02 DIAGNOSIS — F79 Unspecified intellectual disabilities: Secondary | ICD-10-CM

## 2013-12-02 DIAGNOSIS — C6292 Malignant neoplasm of left testis, unspecified whether descended or undescended: Secondary | ICD-10-CM

## 2013-12-02 LAB — CBC WITH DIFFERENTIAL/PLATELET
BASOS ABS: 0 10*3/uL (ref 0.0–0.1)
Basophils Relative: 0 % (ref 0–1)
Eosinophils Absolute: 0 10*3/uL (ref 0.0–0.7)
Eosinophils Relative: 1 % (ref 0–5)
HEMATOCRIT: 33.2 % — AB (ref 39.0–52.0)
Hemoglobin: 10.6 g/dL — ABNORMAL LOW (ref 13.0–17.0)
Lymphocytes Relative: 13 % (ref 12–46)
Lymphs Abs: 0.9 10*3/uL (ref 0.7–4.0)
MCH: 23 pg — ABNORMAL LOW (ref 26.0–34.0)
MCHC: 31.9 g/dL (ref 30.0–36.0)
MCV: 72.2 fL — AB (ref 78.0–100.0)
Monocytes Absolute: 0 10*3/uL — ABNORMAL LOW (ref 0.1–1.0)
Monocytes Relative: 0 % — ABNORMAL LOW (ref 3–12)
Neutro Abs: 6 10*3/uL (ref 1.7–7.7)
Neutrophils Relative %: 86 % — ABNORMAL HIGH (ref 43–77)
PLATELETS: 205 10*3/uL (ref 150–400)
RBC: 4.6 MIL/uL (ref 4.22–5.81)
RDW: 14.9 % (ref 11.5–15.5)
WBC: 6.9 10*3/uL (ref 4.0–10.5)

## 2013-12-02 MED ORDER — SODIUM CHLORIDE 0.9 % IV SOLN
30.0000 [IU] | Freq: Once | INTRAVENOUS | Status: AC
  Administered 2013-12-02: 30 [IU] via INTRAVENOUS
  Filled 2013-12-02: qty 10

## 2013-12-02 MED ORDER — SODIUM CHLORIDE 0.9 % IJ SOLN: 10.0000 mL | INTRAMUSCULAR | Status: DC | PRN

## 2013-12-02 MED ORDER — ACETAMINOPHEN 325 MG PO TABS
650.0000 mg | ORAL_TABLET | Freq: Once | ORAL | Status: AC
  Administered 2013-12-02: 650 mg via ORAL

## 2013-12-02 MED ORDER — PROCHLORPERAZINE MALEATE 10 MG PO TABS
10.0000 mg | ORAL_TABLET | Freq: Once | ORAL | Status: AC
Start: 2013-12-02 — End: 2013-12-02
  Administered 2013-12-02: 10 mg via ORAL
  Filled 2013-12-02: qty 1

## 2013-12-02 MED ORDER — HYDROCODONE-ACETAMINOPHEN 5-325 MG PO TABS: 1.0000 | ORAL_TABLET | Freq: Four times a day (QID) | ORAL | Status: DC | PRN

## 2013-12-02 MED ORDER — SODIUM CHLORIDE 0.9 % IV SOLN
Freq: Once | INTRAVENOUS | Status: AC
  Administered 2013-12-02: 10:00:00 via INTRAVENOUS

## 2013-12-02 MED ORDER — ACETAMINOPHEN 325 MG PO TABS
ORAL_TABLET | ORAL | Status: AC
  Filled 2013-12-02: qty 2

## 2013-12-02 MED ORDER — HEPARIN SOD (PORK) LOCK FLUSH 100 UNIT/ML IV SOLN
500.0000 [IU] | Freq: Once | INTRAVENOUS | Status: AC | PRN
  Administered 2013-12-02: 500 [IU]
  Filled 2013-12-02: qty 5

## 2013-12-02 NOTE — Patient Instructions (Signed)
[facility] Discharge Instructions  RECOMMENDATIONS MADE BY THE CONSULTANT AND ANY TEST RESULTS WILL BE SENT TO YOUR REFERRING PHYSICIAN.  MEDICATIONS PRESCRIBED:  Hydrocodone-Acetaminophen (Vicodin) every 6 hours as needed for moderate-severe pain  INSTRUCTIONS GIVEN AND DISCUSSED: Take Tylenol every 6 hours for 2 days following Bleomycin chemotherapy (to prevent bleomycin-induced fevers). We will repeat lung testing in 2 weeks.  SPECIAL INSTRUCTIONS/FOLLOW-UP: Laboratory work as scheduled before chemotherapy. Return as scheduled for chemotherapy. Return for follow-up at the beginning of cycle 2 of chemotherapy.  Thank you for choosing [facility] to provide your oncology and hematology care.  To afford each patient quality time with our providers, please arrive at least 15 minutes before your scheduled appointment time.  With your help, our goal is to use those 15 minutes to complete the necessary work-up to ensure our physicians have the information they need to help with your evaluation and healthcare recommendations.    Effective January 1st, 2014, we ask that you re-schedule your appointment with our physicians should you arrive 10 or more minutes late for your appointment.  We strive to give you quality time with our providers, and arriving late affects you and other patients whose appointments are after yours.    Again, thank you for choosing [facility].  Our hope is that these requests will decrease the amount of time that you wait before being seen by our physicians.       _____________________________________________________________  Should you have questions after your visit to [facility], please contact our office at [phone] between the hours of 8:30 a.m. and 5:00 p.m.  Voicemails left after 4:30 p.m. will not be returned until the following business day.  For prescription refill requests, have your pharmacy  contact our office with your prescription refill request.

## 2013-12-02 NOTE — Patient Instructions (Signed)
.  [facility] Discharge Instructions  RECOMMENDATIONS MADE BY THE CONSULTANT AND ANY TEST RESULTS WILL BE SENT TO YOUR REFERRING PHYSICIAN.  EXAM FINDINGS BY THE PHYSICIAN TODAY AND SIGNS OR SYMPTOMS TO REPORT TO CLINIC OR PRIMARY PHYSICIAN: Exam and findings as discussed by T. Sheldon Silvan PA. We will do your breathing test when you are here for next cycle  MEDICATIONS PRESCRIBED:  Tylenol every 6 hrs on schedule for 2 days after bleomycin Hydrocodone for pain INSTRUCTIONS/FOLLOW-UP: To be seen by MD during your next treatment then every 3 weeks  Thank you for choosing [facility] to provide your oncology and hematology care.  To afford each patient quality time with our providers, please arrive at least 15 minutes before your scheduled appointment time.  With your help, our goal is to use those 15 minutes to complete the necessary work-up to ensure our physicians have the information they need to help with your evaluation and healthcare recommendations.    Effective January 1st, 2014, we ask that you re-schedule your appointment with our physicians should you arrive 10 or more minutes late for your appointment.  We strive to give you quality time with our providers, and arriving late affects you and other patients whose appointments are after yours.    Again, thank you for choosing [facility].  Our hope is that these requests will decrease the amount of time that you wait before being seen by our physicians.       _____________________________________________________________  Should you have questions after your visit to [facility], please contact our office at [phone] between the hours of 8:30 a.m. and 5:00 p.m.  Voicemails left after 4:30 p.m. will not be returned until the following business day.  For prescription refill requests, have your pharmacy contact our office with your prescription refill request.

## 2013-12-02 NOTE — Progress Notes (Signed)
..  Devon Bailey received bleomycin today. Tolerated well. Has intermittent chest/back pain on rt side. Rates a 3. Relieved by tylenol.

## 2013-12-08 ENCOUNTER — Inpatient Hospital Stay (HOSPITAL_COMMUNITY): Admission: RE | Admit: 2013-12-08 | Payer: Self-pay | Source: Ambulatory Visit

## 2013-12-09 ENCOUNTER — Encounter (HOSPITAL_BASED_OUTPATIENT_CLINIC_OR_DEPARTMENT_OTHER): Payer: Medicaid Other

## 2013-12-09 VITALS — BP 120/76 | HR 97 | Temp 97.9°F | Resp 18

## 2013-12-09 DIAGNOSIS — C6292 Malignant neoplasm of left testis, unspecified whether descended or undescended: Secondary | ICD-10-CM

## 2013-12-09 DIAGNOSIS — F79 Unspecified intellectual disabilities: Secondary | ICD-10-CM

## 2013-12-09 DIAGNOSIS — C781 Secondary malignant neoplasm of mediastinum: Secondary | ICD-10-CM

## 2013-12-09 DIAGNOSIS — C6290 Malignant neoplasm of unspecified testis, unspecified whether descended or undescended: Secondary | ICD-10-CM

## 2013-12-09 DIAGNOSIS — Z5111 Encounter for antineoplastic chemotherapy: Secondary | ICD-10-CM

## 2013-12-09 LAB — CBC WITH DIFFERENTIAL/PLATELET
BASOS ABS: 0 10*3/uL (ref 0.0–0.1)
Basophils Relative: 1 % (ref 0–1)
EOS PCT: 6 % — AB (ref 0–5)
Eosinophils Absolute: 0.1 10*3/uL (ref 0.0–0.7)
HCT: 32.5 % — ABNORMAL LOW (ref 39.0–52.0)
Hemoglobin: 10.4 g/dL — ABNORMAL LOW (ref 13.0–17.0)
LYMPHS ABS: 0.7 10*3/uL (ref 0.7–4.0)
LYMPHS PCT: 59 % — AB (ref 12–46)
MCH: 23.7 pg — ABNORMAL LOW (ref 26.0–34.0)
MCHC: 32 g/dL (ref 30.0–36.0)
MCV: 74 fL — ABNORMAL LOW (ref 78.0–100.0)
MONO ABS: 0.2 10*3/uL (ref 0.1–1.0)
Monocytes Relative: 18 % — ABNORMAL HIGH (ref 3–12)
NEUTROS ABS: 0.2 10*3/uL — AB (ref 1.7–7.7)
Neutrophils Relative %: 16 % — ABNORMAL LOW (ref 43–77)
Platelets: 175 10*3/uL (ref 150–400)
RBC: 4.39 MIL/uL (ref 4.22–5.81)
RDW: 17.3 % — ABNORMAL HIGH (ref 11.5–15.5)
WBC: 1.1 10*3/uL — AB (ref 4.0–10.5)

## 2013-12-09 MED ORDER — HEPARIN SOD (PORK) LOCK FLUSH 100 UNIT/ML IV SOLN
500.0000 [IU] | Freq: Once | INTRAVENOUS | Status: AC | PRN
  Administered 2013-12-09: 500 [IU]
  Filled 2013-12-09: qty 5

## 2013-12-09 MED ORDER — SODIUM CHLORIDE 0.9 % IV SOLN
30.0000 [IU] | Freq: Once | INTRAVENOUS | Status: AC
  Administered 2013-12-09: 30 [IU] via INTRAVENOUS
  Filled 2013-12-09: qty 10

## 2013-12-09 MED ORDER — PROCHLORPERAZINE MALEATE 10 MG PO TABS
10.0000 mg | ORAL_TABLET | Freq: Once | ORAL | Status: AC
  Administered 2013-12-09: 10 mg via ORAL
  Filled 2013-12-09: qty 1

## 2013-12-09 MED ORDER — SODIUM CHLORIDE 0.9 % IJ SOLN
10.0000 mL | INTRAMUSCULAR | Status: DC | PRN
  Administered 2013-12-09: 10 mL

## 2013-12-09 MED ORDER — SODIUM CHLORIDE 0.9 % IV SOLN
Freq: Once | INTRAVENOUS | Status: AC
  Administered 2013-12-09: 11:00:00 via INTRAVENOUS

## 2013-12-09 NOTE — Patient Instructions (Addendum)
[facility] Discharge Instructions for Patients Receiving Chemotherapy  Today you received the following chemotherapy agents Bleomycin start taking tylenol 650mg  today and continue every 6 hours x 48 hours. This is to prevent hyperthermia reaction to the bleomycin per Dr. Barnet Glasgow. Your white count is low and you need to avoid crowds and people who are sick Wear a mask if you do go out in crowds. To help prevent nausea and vomiting after your treatment, we encourage you to take your nausea medication as needed If you develop nausea and vomiting that is not controlled by your nausea medication, call the clinic. If it is after clinic hours your family physician or the after hours number for the clinic or go to the Emergency Department.   BELOW ARE SYMPTOMS THAT SHOULD BE REPORTED IMMEDIATELY:  *FEVER GREATER THAN 101.0 F  *CHILLS WITH OR WITHOUT FEVER  NAUSEA AND VOMITING THAT IS NOT CONTROLLED WITH YOUR NAUSEA MEDICATION  *UNUSUAL SHORTNESS OF BREATH  *UNUSUAL BRUISING OR BLEEDING  TENDERNESS IN MOUTH AND THROAT WITH OR WITHOUT PRESENCE OF ULCERS  *URINARY PROBLEMS  *BOWEL PROBLEMS  UNUSUAL RASH Items with * indicate a potential emergency and should be followed up as soon as possible.  One of the nurses will contact you 24 hours after your treatment. Please let the nurse know about any problems that you may have experienced. Feel free to call the clinic you have any questions or concerns. The clinic phone number is [phone].   I have been informed and understand all the instructions given to me. I know to contact the clinic, my physician, or go to the Emergency Department if any problems should occur. I do not have any questions at this time, but understand that I may call the clinic during office hours or the Patient Navigator at [phone] should I have any questions or need assistance in obtaining follow up  care.    __________________________________________  _____________  __________ Signature of Patient or Authorized Representative            Date                   Time    __________________________________________ Nurse's Signature

## 2013-12-09 NOTE — Progress Notes (Signed)
CRITICAL VALUE ALERT  Critical value received:  WBC 1.1  Date of notification:  12/09/13  Time of notification:  1035  Critical value read back:yes  Nurse who received alert:  Isidoro Donning RN  MD notified (1st page):  Dr. Barnet Glasgow notified and said to treat since Bleomycin doesn't cause myelosuppression.

## 2013-12-15 ENCOUNTER — Encounter (HOSPITAL_BASED_OUTPATIENT_CLINIC_OR_DEPARTMENT_OTHER): Payer: Medicaid Other

## 2013-12-15 ENCOUNTER — Encounter (HOSPITAL_COMMUNITY): Payer: Self-pay

## 2013-12-15 VITALS — BP 115/77 | HR 97 | Temp 98.2°F | Resp 18 | Wt 188.7 lb

## 2013-12-15 DIAGNOSIS — C6290 Malignant neoplasm of unspecified testis, unspecified whether descended or undescended: Secondary | ICD-10-CM

## 2013-12-15 DIAGNOSIS — C6292 Malignant neoplasm of left testis, unspecified whether descended or undescended: Secondary | ICD-10-CM

## 2013-12-15 DIAGNOSIS — C781 Secondary malignant neoplasm of mediastinum: Secondary | ICD-10-CM

## 2013-12-15 DIAGNOSIS — Z5111 Encounter for antineoplastic chemotherapy: Secondary | ICD-10-CM

## 2013-12-15 DIAGNOSIS — F79 Unspecified intellectual disabilities: Secondary | ICD-10-CM

## 2013-12-15 DIAGNOSIS — J310 Chronic rhinitis: Secondary | ICD-10-CM

## 2013-12-15 LAB — CBC WITH DIFFERENTIAL/PLATELET
BASOS ABS: 0.1 10*3/uL (ref 0.0–0.1)
Basophils Relative: 2 % — ABNORMAL HIGH (ref 0–1)
EOS PCT: 1 % (ref 0–5)
Eosinophils Absolute: 0 10*3/uL (ref 0.0–0.7)
HCT: 39.4 % (ref 39.0–52.0)
HEMOGLOBIN: 12.7 g/dL — AB (ref 13.0–17.0)
Lymphocytes Relative: 35 % (ref 12–46)
Lymphs Abs: 1.4 10*3/uL (ref 0.7–4.0)
MCH: 24.5 pg — ABNORMAL LOW (ref 26.0–34.0)
MCHC: 32.2 g/dL (ref 30.0–36.0)
MCV: 75.9 fL — AB (ref 78.0–100.0)
MONO ABS: 1.3 10*3/uL — AB (ref 0.1–1.0)
Monocytes Relative: 33 % — ABNORMAL HIGH (ref 3–12)
Neutro Abs: 1.1 10*3/uL — ABNORMAL LOW (ref 1.7–7.7)
Neutrophils Relative %: 29 % — ABNORMAL LOW (ref 43–77)
Platelets: 395 10*3/uL (ref 150–400)
RBC: 5.19 MIL/uL (ref 4.22–5.81)
RDW: 19.8 % — AB (ref 11.5–15.5)
WBC: 3.9 10*3/uL — AB (ref 4.0–10.5)

## 2013-12-15 LAB — COMPREHENSIVE METABOLIC PANEL
ALBUMIN: 3.9 g/dL (ref 3.5–5.2)
ALK PHOS: 90 U/L (ref 39–117)
ALT: 25 U/L (ref 0–53)
AST: 22 U/L (ref 0–37)
BUN: 12 mg/dL (ref 6–23)
CO2: 28 mEq/L (ref 19–32)
CREATININE: 0.93 mg/dL (ref 0.50–1.35)
Calcium: 9.6 mg/dL (ref 8.4–10.5)
Chloride: 100 mEq/L (ref 96–112)
GFR calc Af Amer: >90 mL/min (ref >90)
GFR calc non Af Amer: >90 mL/min (ref >90)
Glucose, Bld: 86 mg/dL (ref 70–99)
POTASSIUM: 4.6 meq/L (ref 3.7–5.3)
Sodium: 139 mEq/L (ref 137–147)
TOTAL PROTEIN: 8.2 g/dL (ref 6.0–8.3)
Total Bilirubin: <0.2 mg/dL — ABNORMAL LOW (ref 0.3–1.2)

## 2013-12-15 LAB — AFP TUMOR MARKER: AFP-Tumor Marker: 17.7 ng/mL — ABNORMAL HIGH (ref 0.0–8.0)

## 2013-12-15 MED ORDER — SODIUM CHLORIDE 0.9 % IV SOLN
Freq: Once | INTRAVENOUS | Status: AC
  Administered 2013-12-15: 10:00:00 via INTRAVENOUS

## 2013-12-15 MED ORDER — POTASSIUM CHLORIDE 2 MEQ/ML IV SOLN
Freq: Once | INTRAVENOUS | Status: AC
  Administered 2013-12-15: 11:00:00 via INTRAVENOUS
  Filled 2013-12-15: qty 10

## 2013-12-15 MED ORDER — SODIUM CHLORIDE 0.9 % IV SOLN
Freq: Once | INTRAVENOUS | Status: AC
  Administered 2013-12-15: 11:00:00 via INTRAVENOUS
  Filled 2013-12-15: qty 5

## 2013-12-15 MED ORDER — HEPARIN SOD (PORK) LOCK FLUSH 100 UNIT/ML IV SOLN
500.0000 [IU] | Freq: Once | INTRAVENOUS | Status: AC | PRN
  Administered 2013-12-15: 500 [IU]
  Filled 2013-12-15: qty 5

## 2013-12-15 MED ORDER — FOSAPREPITANT DIMEGLUMINE INJECTION 150 MG: 150.0000 mg | Freq: Once | INTRAVENOUS | Status: DC

## 2013-12-15 MED ORDER — SODIUM CHLORIDE 0.9 % IV SOLN
100.0000 mg/m2 | Freq: Once | INTRAVENOUS | Status: AC
  Administered 2013-12-15: 210 mg via INTRAVENOUS
  Filled 2013-12-15: qty 10.5

## 2013-12-15 MED ORDER — SODIUM CHLORIDE 0.9 % IV SOLN
20.0000 mg/m2 | Freq: Once | INTRAVENOUS | Status: AC
  Administered 2013-12-15: 42 mg via INTRAVENOUS
  Filled 2013-12-15: qty 42

## 2013-12-15 MED ORDER — PALONOSETRON HCL INJECTION 0.25 MG/5ML
0.2500 mg | Freq: Once | INTRAVENOUS | Status: AC
  Administered 2013-12-15: 0.25 mg via INTRAVENOUS
  Filled 2013-12-15: qty 5

## 2013-12-15 MED ORDER — DEXAMETHASONE SODIUM PHOSPHATE 20 MG/5ML IJ SOLN: 12.0000 mg | Freq: Once | INTRAMUSCULAR | Status: DC

## 2013-12-15 MED ORDER — SODIUM CHLORIDE 0.9 % IJ SOLN: 10.0000 mL | INTRAMUSCULAR | Status: DC | PRN

## 2013-12-15 NOTE — Progress Notes (Signed)
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 Nonseminomatous germ cell tumor with abdominal and mediastin    4 chemotherapy, cycle 2 of BEP    CURRENT THERAPY: Begin cycle 2 of BEP chemotherapy today  INTERVAL HISTORY: Devon Bailey 23 y.o. male returns for beginning of cycle 2 of BEP therapy for stage IV nonseminomatous germ cell tumor.  Previously noted anterior chest discomfort as well as cough at both improved dramatically. Appetite is good with no sore throat, diarrhea, constipation, dysuria, hematuria, incontinence, or peripheral paresthesias. He denies any headache, PND, orthopnea, palpitations, skin rash, or seizures.  MEDICAL HISTORY: Past Medical History  Diagnosis Date  . Anxiety   . Seasonal allergies   . Mentally challenged     mother states mentally "slow"  . 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    ALLERGIES:  is allergic to penicillins.  MEDICATIONS: has a current medication list which includes the following prescription(s): acetaminophen, acetaminophen, bleomycin sulfate, cisplatin, etoposide, hydrocodone-acetaminophen, lidocaine-prilocaine, metoclopramide, and prochlorperazine, and the following Facility-Administered Medications: sodium chloride, CISplatin (PLATINOL) 42 mg in sodium chloride 0.9 % 250 mL chemo infusion, dextrose 5 % and 0.45% NaCl 1,000 mL with potassium chloride 20 mEq, magnesium sulfate 12 mEq infusion, etoposide (VEPESID) 210 mg in sodium chloride 0.9 % 600 mL chemo infusion, fosaprepitant (EMEND) 150 mg, dexamethasone (DECADRON) 12 mg in sodium chloride 0.9 % 145 mL IVPB, heparin lock flush,  lorazepam, and sodium chloride.  SURGICAL HISTORY:  Past Surgical History  Procedure Laterality Date  . Orchiectomy Left 08/07/2013    Procedure: RADICAL ORCHIECTOMY;  Surgeon: Marissa Nestle, MD;  Location: [facility];  Service: Urology;  Laterality: Left;  . Radical orchiectomy Left 08/07/2013  . Portacath placement Right     FAMILY HISTORY: family history is not on file.  SOCIAL HISTORY:  reports that he has never smoked. He has never used smokeless tobacco. He reports that he does not drink alcohol or use illicit drugs.  REVIEW OF SYSTEMS:  Other than that discussed above is noncontributory.  PHYSICAL EXAMINATION: ECOG PERFORMANCE STATUS: 0 - Asymptomatic  Blood pressure 115/77, pulse 97, temperature 98.2 F (36.8 C), temperature source Oral, resp. rate 18, weight 188 lb 11.2 oz (85.594 kg).  GENERAL:alert, no distress and comfortable SKIN: skin color, texture, turgor are normal, no rashes or significant lesions EYES: PERLA; Conjunctiva are pink and non-injected, sclera clear OROPHARYNX:no exudate, no erythema on lips, buccal mucosa, or tongue. NECK: supple, thyroid normal size, non-tender, without nodularity. No masses CHEST: LifePort in place. No gynecomastia. LYMPH:  no palpable lymphadenopathy in the cervical, axillary or inguinal LUNGS: clear to auscultation and percussion with normal breathing effort HEART: regular rate & rhythm and no murmurs. ABDOMEN:abdomen soft, non-tender and normal bowel sounds MUSCULOSKELETAL:no cyanosis of digits and no clubbing. Range of motion normal.  NEURO: alert & oriented x 3 with fluent speech, no focal motor/sensory deficits GENITALIA: Left testis is absent. No mass in the right testis.   LABORATORY DATA: Infusion on 12/15/2013  Component Date Value Range Status  . WBC 12/15/2013 3.9* 4.0 - 10.5 K/uL Final  . RBC 12/15/2013 5.19  4.22 - 5.81 MIL/uL Final  .  Hemoglobin 12/15/2013 12.7* 13.0 - 17.0 g/dL Final  . HCT 12/15/2013 39.4   39.0 - 52.0 % Final  . MCV 12/15/2013 75.9* 78.0 - 100.0 fL Final  . MCH 12/15/2013 24.5* 26.0 - 34.0 pg Final  . MCHC 12/15/2013 32.2  30.0 - 36.0 g/dL Final  . RDW 12/15/2013 19.8* 11.5 - 15.5 % Final  . Platelets 12/15/2013 395  150 - 400 K/uL Final  . Neutrophils Relative % 12/15/2013 29* 43 - 77 % Final  . Neutro Abs 12/15/2013 1.1* 1.7 - 7.7 K/uL Final  . Lymphocytes Relative 12/15/2013 35  12 - 46 % Final  . Lymphs Abs 12/15/2013 1.4  0.7 - 4.0 K/uL Final  . Monocytes Relative 12/15/2013 33* 3 - 12 % Final  . Monocytes Absolute 12/15/2013 1.3* 0.1 - 1.0 K/uL Final  . Eosinophils Relative 12/15/2013 1  0 - 5 % Final  . Eosinophils Absolute 12/15/2013 0.0  0.0 - 0.7 K/uL Final  . Basophils Relative 12/15/2013 2* 0 - 1 % Final  . Basophils Absolute 12/15/2013 0.1  0.0 - 0.1 K/uL Final  . WBC Morphology 12/15/2013 MILD LEFT SHIFT (1-5% METAS, OCC MYELO, OCC BANDS)   Final  . Sodium 12/15/2013 139  137 - 147 mEq/L Final  . Potassium 12/15/2013 4.6  3.7 - 5.3 mEq/L Final  . Chloride 12/15/2013 100  96 - 112 mEq/L Final  . CO2 12/15/2013 28  19 - 32 mEq/L Final  . Glucose, Bld 12/15/2013 86  70 - 99 mg/dL Final  . BUN 12/15/2013 12  6 - 23 mg/dL Final  . Creatinine, Ser 12/15/2013 0.93  0.50 - 1.35 mg/dL Final  . Calcium 12/15/2013 9.6  8.4 - 10.5 mg/dL Final  . Total Protein 12/15/2013 8.2  6.0 - 8.3 g/dL Final  . Albumin 12/15/2013 3.9  3.5 - 5.2 g/dL Final  . AST 12/15/2013 22  0 - 37 U/L Final  . ALT 12/15/2013 25  0 - 53 U/L Final  . Alkaline Phosphatase 12/15/2013 90  39 - 117 U/L Final  . Total Bilirubin 12/15/2013 <0.2* 0.3 - 1.2 mg/dL Final  . GFR calc non Af Amer 12/15/2013 >90  >90 mL/min Final  . GFR calc Af Amer 12/15/2013 >90  >90 mL/min Final   Comment: (NOTE)                          The eGFR has been calculated using the CKD EPI equation.                          This calculation has not been validated in all clinical situations.                          eGFR's  persistently <90 mL/min signify possible Chronic Kidney                          Disease.  Infusion on 12/09/2013  Component Date Value Range Status  . WBC 12/09/2013 1.1* 4.0 - 10.5 K/uL Final   Comment: RESULT REPEATED AND VERIFIED                          WHITE COUNT CONFIRMED ON SMEAR                            CRITICAL RESULT CALLED TO, READ BACK BY AND VERIFIED WITH:                          HAILEY BRAY RN ON [id number] AT 1030 BY RESSEGGER R  . RBC 12/09/2013 4.39  4.22 - 5.81 MIL/uL Final  . Hemoglobin 12/09/2013 10.4* 13.0 - 17.0 g/dL Final  . HCT 12/09/2013 32.5* 39.0 - 52.0 % Final  . MCV 12/09/2013 74.0* 78.0 - 100.0 fL Final  . MCH 12/09/2013 23.7* 26.0 - 34.0 pg Final  . MCHC 12/09/2013 32.0  30.0 - 36.0 g/dL Final  . RDW 12/09/2013 17.3* 11.5 - 15.5 % Final  . Platelets 12/09/2013 175  150 - 400 K/uL Final  . Neutrophils Relative % 12/09/2013 16* 43 - 77 % Final  . Neutro Abs 12/09/2013 0.2* 1.7 - 7.7 K/uL Final  . Lymphocytes Relative 12/09/2013 59* 12 - 46 % Final  . Lymphs Abs 12/09/2013 0.7  0.7 - 4.0 K/uL Final  . Monocytes Relative 12/09/2013 18* 3 - 12 % Final  . Monocytes Absolute 12/09/2013 0.2  0.1 - 1.0 K/uL Final  . Eosinophils Relative 12/09/2013 6* 0 - 5 % Final  . Eosinophils Absolute 12/09/2013 0.1  0.0 - 0.7 K/uL Final  . Basophils Relative 12/09/2013 1  0 - 1 % Final  . Basophils Absolute 12/09/2013 0.0  0.0 - 0.1 K/uL Final  Infusion on 12/02/2013  Component Date Value Range Status  . WBC 12/02/2013 6.9  4.0 - 10.5 K/uL Final  . RBC 12/02/2013 4.60  4.22 - 5.81 MIL/uL Final  . Hemoglobin 12/02/2013 10.6* 13.0 - 17.0 g/dL Final  . HCT 12/02/2013 33.2* 39.0 - 52.0 % Final  . MCV 12/02/2013 72.2* 78.0 - 100.0 fL Final  . MCH 12/02/2013 23.0* 26.0 - 34.0 pg Final  . MCHC 12/02/2013 31.9  30.0 - 36.0 g/dL Final  . RDW 12/02/2013 14.9  11.5 - 15.5 % Final  . Platelets 12/02/2013 205  150 - 400 K/uL Final  . Neutrophils Relative % 12/02/2013 86* 43 - 77 %  Final  . Neutro Abs 12/02/2013 6.0  1.7 - 7.7 K/uL Final  . Lymphocytes Relative 12/02/2013 13  12 - 46 % Final  . Lymphs Abs 12/02/2013 0.9  0.7 - 4.0 K/uL Final  . Monocytes Relative 12/02/2013 0* 3 - 12 % Final  . Monocytes Absolute 12/02/2013 0.0* 0.1 - 1.0 K/uL Final  . Eosinophils Relative 12/02/2013 1  0 - 5 % Final  . Eosinophils Absolute 12/02/2013 0.0  0.0 - 0.7 K/uL Final  . Basophils Relative 12/02/2013 0  0 - 1 % Final  . Basophils Absolute 12/02/2013 0.0  0.0 - 0.1 K/uL Final  Infusion on 11/24/2013  Component Date Value Range Status  . WBC 11/24/2013 7.7  4.0 - 10.5 K/uL Final  . RBC 11/24/2013 4.69  4.22 - 5.81 MIL/uL Final  . Hemoglobin 11/24/2013 10.8* 13.0 - 17.0 g/dL Final  . HCT 11/24/2013 34.3* 39.0 - 52.0 % Final  . MCV 11/24/2013 73.1* 78.0 - 100.0 fL Final  . MCH 11/24/2013 23.0* 26.0 - 34.0 pg Final  . MCHC 11/24/2013 31.5  30.0 - 36.0 g/dL Final  . RDW 11/24/2013 15.2  11.5 - 15.5 % Final  . Platelets 11/24/2013 401* 150 - 400 K/uL Final  . Neutrophils Relative % 11/24/2013 71  43 - 77 % Final  . Neutro Abs 11/24/2013 5.5  1.7 - 7.7 K/uL Final  . Lymphocytes  Relative 11/24/2013 13  12 - 46 % Final  . Lymphs Abs 11/24/2013 1.0  0.7 - 4.0 K/uL Final  . Monocytes Relative 11/24/2013 15* 3 - 12 % Final  . Monocytes Absolute 11/24/2013 1.1* 0.1 - 1.0 K/uL Final  . Eosinophils Relative 11/24/2013 1  0 - 5 % Final  . Eosinophils Absolute 11/24/2013 0.1  0.0 - 0.7 K/uL Final  . Basophils Relative 11/24/2013 0  0 - 1 % Final  . Basophils Absolute 11/24/2013 0.0  0.0 - 0.1 K/uL Final  . Sodium 11/24/2013 135* 137 - 147 mEq/L Final  . Potassium 11/24/2013 4.2  3.7 - 5.3 mEq/L Final  . Chloride 11/24/2013 97  96 - 112 mEq/L Final  . CO2 11/24/2013 25  19 - 32 mEq/L Final  . Glucose, Bld 11/24/2013 84  70 - 99 mg/dL Final  . BUN 11/24/2013 12  6 - 23 mg/dL Final  . Creatinine, Ser 11/24/2013 0.76  0.50 - 1.35 mg/dL Final  . Calcium 11/24/2013 9.5  8.4 - 10.5 mg/dL  Final  . Total Protein 11/24/2013 8.3  6.0 - 8.3 g/dL Final  . Albumin 11/24/2013 3.5  3.5 - 5.2 g/dL Final  . AST 11/24/2013 22  0 - 37 U/L Final  . ALT 11/24/2013 20  0 - 53 U/L Final  . Alkaline Phosphatase 11/24/2013 81  39 - 117 U/L Final  . Total Bilirubin 11/24/2013 0.3  0.3 - 1.2 mg/dL Final  . GFR calc non Af Amer 11/24/2013 >90  >90 mL/min Final  . GFR calc Af Amer 11/24/2013 >90  >90 mL/min Final   Comment: (NOTE)                          The eGFR has been calculated using the CKD EPI equation.                          This calculation has not been validated in all clinical situations.                          eGFR's persistently <90 mL/min signify possible Chronic Kidney                          Disease.  . Beta hCG, Tumor Marker 11/24/2013 74601.0* <5.0 mIU/mL Final   Comment: (NOTE)                             Reference Range:                               Males: <5 mIU/mL                               Females: Nonpregnant: <5 mIU/mL                           Values from different assay methods may vary. The use of this                           assay to monitor or to diagnose patients with cancer or any                             condition other than pregnancy has not been approved by the FDA                           of the manufacturer of the assay.                           This HCG assay is performed on the [employer]                           platform. Use of an alternative HCG assay provides corroboration                           potentially useful in distinguising true versus false elevations                           of HCG. To further rule out false elevations of HCG, also                           consider measurement of HCG after pretreatment for human                           anti-animal [mouse] antibody (HCG, Total, with HAMA treatment),                           order code 19720. Confirmed by repeat analysis.                           Performed at [facility].  . AFP-Tumor Marker 11/24/2013 74.6* 0.0 - 8.0 ng/mL Final   Comment: (NOTE)                          The [employer] Centaur AFP immunoassay method is used.  Results obtained                          with different assay methods or kits cannot be used interchangeably.                          AFP is a valuable aid in the management of nonseminomatous testicular                          cancer patients when used in conjunction with information available                          from the clinical evaluation and other diagnostic procedures.                          Increased serum AFP concentrations have also been observed in ataxia                          telangiectasia, hereditary tyrosinemia, primary hepatocellular                          carcinoma, teratocarcinoma, gastrointestinal tract  cancers with and                          without liver metastases, and in benign hepatic conditions such as                          acute viral hepatitis, chronic active hepatitis, and cirrhosis.  This                          result cannot be interpreted as absolute evidence of the presence or                          absence of malignant disease.  This result is not interpretable in                          pregnant females.                          Performed at [facility]    PATHOLOGY: Nonseminomatous germ cell tumor.  Urinalysis    Component Value Date/Time   COLORURINE YELLOW 07/05/2013 2240   APPEARANCEUR CLEAR 07/05/2013 2240   LABSPEC 1.025 07/05/2013 2240   PHURINE 8.5* 07/05/2013 2240   GLUCOSEU NEGATIVE 07/05/2013 2240   HGBUR NEGATIVE 07/05/2013 2240   BILIRUBINUR NEGATIVE 07/05/2013 2240   KETONESUR NEGATIVE 07/05/2013 2240   PROTEINUR NEGATIVE 07/05/2013 2240   UROBILINOGEN 0.2 07/05/2013 2240   NITRITE NEGATIVE 07/05/2013 2240   LEUKOCYTESUR NEGATIVE 07/05/2013 2240    RADIOGRAPHIC STUDIES: No results found.  ASSESSMENT:  #1. 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 #2. Mild mental retardation.. #3. DLCO 73% of predicted prior to treatment, for repeat pulmonary function tests on 12/15/2013. #4. Perennial rhinitis, controlled.   PLAN:  #1. Repeat beta hCG and alpha-fetoprotein determinations today. #2. Begin cycle 2 of BEP therapy today. #3. Repeat pulmonary function tests with DLCO on 12/17/2013. #4. Weekly bleomycin. #5. Office visit with physical exam in 3 weeks.   All questions were answered. The patient knows to call the clinic with any problems, questions or concerns. We can certainly see the patient much sooner if necessary.   I spent 25 minutes counseling the patient face to face. The total time spent in the appointment was 30 minutes.    Formanek, Gregory A, MD 12/15/2013 11:03 AM   

## 2013-12-15 NOTE — Patient Instructions (Signed)
[facility] Discharge Instructions  RECOMMENDATIONS MADE BY THE CONSULTANT AND ANY TEST RESULTS WILL BE SENT TO YOUR REFERRING PHYSICIAN.  EXAM FINDINGS BY THE PHYSICIAN TODAY AND SIGNS OR SYMPTOMS TO REPORT TO CLINIC OR PRIMARY PHYSICIAN: Exam and findings as discussed by Dr. Barnet Glasgow.  You are doing well.  Report fevers, chills, uncontrolled nausea, vomiting or other problems.  MEDICATIONS PRESCRIBED:  none  INSTRUCTIONS/FOLLOW-UP: Chemotherapy as scheduled, PFTs as scheduled and MD visit in 3 weeks.  Thank you for choosing [facility] to provide your oncology and hematology care.  To afford each patient quality time with our providers, please arrive at least 15 minutes before your scheduled appointment time.  With your help, our goal is to use those 15 minutes to complete the necessary work-up to ensure our physicians have the information they need to help with your evaluation and healthcare recommendations.    Effective January 1st, 2014, we ask that you re-schedule your appointment with our physicians should you arrive 10 or more minutes late for your appointment.  We strive to give you quality time with our providers, and arriving late affects you and other patients whose appointments are after yours.    Again, thank you for choosing [facility].  Our hope is that these requests will decrease the amount of time that you wait before being seen by our physicians.       _____________________________________________________________  Should you have questions after your visit to [facility], please contact our office at [phone] between the hours of 8:30 a.m. and 5:00 p.m.  Voicemails left after 4:30 p.m. will not be returned until the following business day.  For prescription refill requests, have your pharmacy contact our office with your prescription refill request.

## 2013-12-16 ENCOUNTER — Encounter (HOSPITAL_BASED_OUTPATIENT_CLINIC_OR_DEPARTMENT_OTHER): Payer: Medicaid Other

## 2013-12-16 ENCOUNTER — Ambulatory Visit (HOSPITAL_COMMUNITY): Payer: Self-pay | Admitting: Oncology

## 2013-12-16 VITALS — BP 128/75 | HR 108 | Temp 97.0°F | Resp 16 | Wt 189.6 lb

## 2013-12-16 DIAGNOSIS — Z5111 Encounter for antineoplastic chemotherapy: Secondary | ICD-10-CM

## 2013-12-16 DIAGNOSIS — C781 Secondary malignant neoplasm of mediastinum: Secondary | ICD-10-CM

## 2013-12-16 DIAGNOSIS — F79 Unspecified intellectual disabilities: Secondary | ICD-10-CM

## 2013-12-16 DIAGNOSIS — C6290 Malignant neoplasm of unspecified testis, unspecified whether descended or undescended: Secondary | ICD-10-CM

## 2013-12-16 MED ORDER — SODIUM CHLORIDE 0.9 % IV SOLN
Freq: Once | INTRAVENOUS | Status: AC
  Administered 2013-12-16: 20 mL/h via INTRAVENOUS

## 2013-12-16 MED ORDER — DEXAMETHASONE SODIUM PHOSPHATE 10 MG/ML IJ SOLN: 20.0000 mg | Freq: Once | INTRAMUSCULAR | Status: DC

## 2013-12-16 MED ORDER — SODIUM CHLORIDE 0.9 % IV SOLN
20.0000 mg/m2 | Freq: Once | INTRAVENOUS | Status: AC
  Administered 2013-12-16: 42 mg via INTRAVENOUS
  Filled 2013-12-16: qty 42

## 2013-12-16 MED ORDER — POTASSIUM CHLORIDE 2 MEQ/ML IV SOLN
Freq: Once | INTRAVENOUS | Status: AC
  Administered 2013-12-16: 10:00:00 via INTRAVENOUS
  Filled 2013-12-16: qty 10

## 2013-12-16 MED ORDER — SODIUM CHLORIDE 0.9 % IV SOLN
30.0000 [IU] | Freq: Once | INTRAVENOUS | Status: AC
  Administered 2013-12-16: 30 [IU] via INTRAVENOUS
  Filled 2013-12-16: qty 10

## 2013-12-16 MED ORDER — SODIUM CHLORIDE 0.9 % IV SOLN
100.0000 mg/m2 | Freq: Once | INTRAVENOUS | Status: AC
  Administered 2013-12-16: 210 mg via INTRAVENOUS
  Filled 2013-12-16: qty 10.5

## 2013-12-16 MED ORDER — SODIUM CHLORIDE 0.9 % IV SOLN
20.0000 mg | Freq: Once | INTRAVENOUS | Status: AC
  Administered 2013-12-16: 20 mg via INTRAVENOUS
  Filled 2013-12-16: qty 2

## 2013-12-16 MED ORDER — HEPARIN SOD (PORK) LOCK FLUSH 100 UNIT/ML IV SOLN
500.0000 [IU] | Freq: Once | INTRAVENOUS | Status: AC | PRN
  Administered 2013-12-16: 500 [IU]
  Filled 2013-12-16: qty 5

## 2013-12-16 MED ORDER — SODIUM CHLORIDE 0.9 % IJ SOLN
10.0000 mL | INTRAMUSCULAR | Status: DC | PRN
  Administered 2013-12-16: 10 mL

## 2013-12-16 NOTE — Progress Notes (Signed)
Devon Bailey Menser Tolerated chemotherapy well today

## 2013-12-17 ENCOUNTER — Ambulatory Visit (HOSPITAL_COMMUNITY)
Admission: RE | Admit: 2013-12-17 | Discharge: 2013-12-17 | Disposition: A | Discharge status: Home / Self Care | Payer: Medicaid Other | Source: Ambulatory Visit | Attending: Oncology | Admitting: Oncology

## 2013-12-17 ENCOUNTER — Encounter (HOSPITAL_BASED_OUTPATIENT_CLINIC_OR_DEPARTMENT_OTHER): Payer: Medicaid Other

## 2013-12-17 VITALS — BP 127/71 | HR 103 | Temp 98.1°F | Resp 16 | Wt 196.4 lb

## 2013-12-17 DIAGNOSIS — Z9221 Personal history of antineoplastic chemotherapy: Secondary | ICD-10-CM | POA: Insufficient documentation

## 2013-12-17 DIAGNOSIS — C6290 Malignant neoplasm of unspecified testis, unspecified whether descended or undescended: Secondary | ICD-10-CM | POA: Insufficient documentation

## 2013-12-17 DIAGNOSIS — C781 Secondary malignant neoplasm of mediastinum: Secondary | ICD-10-CM

## 2013-12-17 DIAGNOSIS — Z5111 Encounter for antineoplastic chemotherapy: Secondary | ICD-10-CM

## 2013-12-17 DIAGNOSIS — F79 Unspecified intellectual disabilities: Secondary | ICD-10-CM

## 2013-12-17 LAB — PULMONARY FUNCTION TEST
DL/VA % PRED: 100 %
DL/VA: 4.68 ml/min/mmHg/L
DLCO COR: 26.93 ml/min/mmHg
DLCO cor % pred: 86 %
DLCO unc % pred: 86 %
DLCO unc: 26.93 ml/min/mmHg
FEF 25-75 Post: 5.31 L/sec
FEF 25-75 Pre: 3.97 L/sec
FEF2575-%CHANGE-POST: 33 %
FEF2575-%PRED-POST: 109 %
FEF2575-%Pred-Pre: 81 %
FEV1-%Change-Post: 9 %
FEV1-%Pred-Post: 94 %
FEV1-%Pred-Pre: 85 %
FEV1-POST: 4.29 L
FEV1-Pre: 3.9 L
FEV1FVC-%CHANGE-POST: 5 %
FEV1FVC-%Pred-Pre: 98 %
FEV6-%CHANGE-POST: 5 %
FEV6-%PRED-PRE: 86 %
FEV6-%Pred-Post: 90 %
FEV6-PRE: 4.68 L
FEV6-Post: 4.93 L
FEV6FVC-%Change-Post: 0 %
FEV6FVC-%PRED-POST: 100 %
FEV6FVC-%PRED-PRE: 99 %
FVC-%Change-Post: 4 %
FVC-%PRED-POST: 90 %
FVC-%Pred-Pre: 86 %
FVC-PRE: 4.71 L
FVC-Post: 4.93 L
POST FEV6/FVC RATIO: 100 %
Post FEV1/FVC ratio: 87 %
Pre FEV1/FVC ratio: 83 %
Pre FEV6/FVC Ratio: 99 %
RV % pred: 81 %
RV: 1.11 L
TLC % pred: 91 %
TLC: 6.11 L

## 2013-12-17 LAB — BETA HCG QUANT (REF LAB): Beta hCG, Tumor Marker: 406.6 m[IU]/mL — ABNORMAL HIGH (ref <5.0)

## 2013-12-17 MED ORDER — DEXAMETHASONE SODIUM PHOSPHATE 10 MG/ML IJ SOLN: 20.0000 mg | Freq: Once | INTRAMUSCULAR | Status: DC

## 2013-12-17 MED ORDER — SODIUM CHLORIDE 0.9 % IV SOLN
20.0000 mg/m2 | Freq: Once | INTRAVENOUS | Status: AC
  Administered 2013-12-17: 42 mg via INTRAVENOUS
  Filled 2013-12-17: qty 42

## 2013-12-17 MED ORDER — SODIUM CHLORIDE 0.9 % IV SOLN
20.0000 mg | Freq: Once | INTRAVENOUS | Status: AC
  Administered 2013-12-17: 20 mg via INTRAVENOUS
  Filled 2013-12-17: qty 2

## 2013-12-17 MED ORDER — HEPARIN SOD (PORK) LOCK FLUSH 100 UNIT/ML IV SOLN
500.0000 [IU] | Freq: Once | INTRAVENOUS | Status: AC | PRN
  Administered 2013-12-17: 500 [IU]

## 2013-12-17 MED ORDER — PALONOSETRON HCL INJECTION 0.25 MG/5ML
INTRAVENOUS | Status: AC
  Filled 2013-12-17: qty 5

## 2013-12-17 MED ORDER — ALBUTEROL SULFATE (2.5 MG/3ML) 0.083% IN NEBU
2.5000 mg | INHALATION_SOLUTION | Freq: Once | RESPIRATORY_TRACT | Status: AC
  Administered 2013-12-17: 2.5 mg via RESPIRATORY_TRACT

## 2013-12-17 MED ORDER — DEXTROSE-NACL 5-0.45 % IV SOLN
Freq: Once | INTRAVENOUS | Status: AC
  Administered 2013-12-17: 10:00:00 via INTRAVENOUS
  Filled 2013-12-17: qty 10

## 2013-12-17 MED ORDER — HEPARIN SOD (PORK) LOCK FLUSH 100 UNIT/ML IV SOLN
INTRAVENOUS | Status: AC
  Filled 2013-12-17: qty 5

## 2013-12-17 MED ORDER — SODIUM CHLORIDE 0.9 % IV SOLN
100.0000 mg/m2 | Freq: Once | INTRAVENOUS | Status: AC
  Administered 2013-12-17: 210 mg via INTRAVENOUS
  Filled 2013-12-17: qty 10.5

## 2013-12-17 MED ORDER — SODIUM CHLORIDE 0.9 % IJ SOLN
10.0000 mL | INTRAMUSCULAR | Status: DC | PRN
  Administered 2013-12-17: 10 mL

## 2013-12-17 MED ORDER — PALONOSETRON HCL INJECTION 0.25 MG/5ML
0.2500 mg | Freq: Once | INTRAVENOUS | Status: AC
  Administered 2013-12-17: 0.25 mg via INTRAVENOUS

## 2013-12-17 NOTE — Progress Notes (Signed)
Devon Bailey tolerated infusions well and without incident; verbalizes understanding for follow-up.  No distress noted at time of discharge and patient was discharged home with his mother.  Having PFT performed prior to going home.

## 2013-12-18 ENCOUNTER — Encounter (HOSPITAL_BASED_OUTPATIENT_CLINIC_OR_DEPARTMENT_OTHER): Payer: Medicaid Other

## 2013-12-18 ENCOUNTER — Encounter (HOSPITAL_COMMUNITY): Payer: Self-pay | Admitting: Hematology and Oncology

## 2013-12-18 VITALS — BP 114/73 | HR 94 | Temp 98.1°F | Resp 18 | Wt 197.8 lb

## 2013-12-18 DIAGNOSIS — Z5111 Encounter for antineoplastic chemotherapy: Secondary | ICD-10-CM

## 2013-12-18 DIAGNOSIS — C6290 Malignant neoplasm of unspecified testis, unspecified whether descended or undescended: Secondary | ICD-10-CM

## 2013-12-18 DIAGNOSIS — F79 Unspecified intellectual disabilities: Secondary | ICD-10-CM

## 2013-12-18 MED ORDER — SODIUM CHLORIDE 0.9 % IJ SOLN
10.0000 mL | INTRAMUSCULAR | Status: DC | PRN
  Administered 2013-12-18: 10 mL

## 2013-12-18 MED ORDER — DEXAMETHASONE SODIUM PHOSPHATE 10 MG/ML IJ SOLN: 20.0000 mg | Freq: Once | INTRAMUSCULAR | Status: DC

## 2013-12-18 MED ORDER — SODIUM CHLORIDE 0.9 % IV SOLN
20.0000 mg/m2 | Freq: Once | INTRAVENOUS | Status: AC
  Administered 2013-12-18: 42 mg via INTRAVENOUS
  Filled 2013-12-18: qty 42

## 2013-12-18 MED ORDER — HEPARIN SOD (PORK) LOCK FLUSH 100 UNIT/ML IV SOLN
INTRAVENOUS | Status: AC
  Filled 2013-12-18: qty 5

## 2013-12-18 MED ORDER — SODIUM CHLORIDE 0.9 % IV SOLN
20.0000 mg | Freq: Once | INTRAVENOUS | Status: AC
  Administered 2013-12-18: 20 mg via INTRAVENOUS
  Filled 2013-12-18: qty 2

## 2013-12-18 MED ORDER — SODIUM CHLORIDE 0.9 % IV SOLN
Freq: Once | INTRAVENOUS | Status: AC
  Administered 2013-12-18: 10:00:00 via INTRAVENOUS

## 2013-12-18 MED ORDER — HEPARIN SOD (PORK) LOCK FLUSH 100 UNIT/ML IV SOLN
500.0000 [IU] | Freq: Once | INTRAVENOUS | Status: AC | PRN
  Administered 2013-12-18: 500 [IU]

## 2013-12-18 MED ORDER — SODIUM CHLORIDE 0.9 % IV SOLN
100.0000 mg/m2 | Freq: Once | INTRAVENOUS | Status: AC
  Administered 2013-12-18: 210 mg via INTRAVENOUS
  Filled 2013-12-18: qty 10.5

## 2013-12-18 MED ORDER — POTASSIUM CHLORIDE 2 MEQ/ML IV SOLN
Freq: Once | INTRAVENOUS | Status: AC
  Administered 2013-12-18: 10:00:00 via INTRAVENOUS
  Filled 2013-12-18: qty 10

## 2013-12-19 ENCOUNTER — Encounter (HOSPITAL_BASED_OUTPATIENT_CLINIC_OR_DEPARTMENT_OTHER): Payer: Medicaid Other

## 2013-12-19 VITALS — BP 119/75 | HR 80 | Temp 98.1°F | Resp 20 | Wt 198.6 lb

## 2013-12-19 DIAGNOSIS — C6290 Malignant neoplasm of unspecified testis, unspecified whether descended or undescended: Secondary | ICD-10-CM

## 2013-12-19 DIAGNOSIS — F79 Unspecified intellectual disabilities: Secondary | ICD-10-CM

## 2013-12-19 DIAGNOSIS — Z5111 Encounter for antineoplastic chemotherapy: Secondary | ICD-10-CM

## 2013-12-19 MED ORDER — SODIUM CHLORIDE 0.9 % IV SOLN
100.0000 mg/m2 | Freq: Once | INTRAVENOUS | Status: AC
  Administered 2013-12-19: 210 mg via INTRAVENOUS
  Filled 2013-12-19: qty 10.5

## 2013-12-19 MED ORDER — SODIUM CHLORIDE 0.9 % IV SOLN
Freq: Once | INTRAVENOUS | Status: AC
  Administered 2013-12-19: 10:00:00 via INTRAVENOUS

## 2013-12-19 MED ORDER — HEPARIN SOD (PORK) LOCK FLUSH 100 UNIT/ML IV SOLN
500.0000 [IU] | Freq: Once | INTRAVENOUS | Status: AC | PRN
  Administered 2013-12-19: 500 [IU]
  Filled 2013-12-19: qty 5

## 2013-12-19 MED ORDER — SODIUM CHLORIDE 0.9 % IV SOLN
20.0000 mg | Freq: Once | INTRAVENOUS | Status: AC
  Administered 2013-12-19: 20 mg via INTRAVENOUS
  Filled 2013-12-19: qty 2

## 2013-12-19 MED ORDER — PALONOSETRON HCL INJECTION 0.25 MG/5ML
0.2500 mg | Freq: Once | INTRAVENOUS | Status: AC
  Administered 2013-12-19: 0.25 mg via INTRAVENOUS
  Filled 2013-12-19: qty 5

## 2013-12-19 MED ORDER — DEXAMETHASONE SODIUM PHOSPHATE 10 MG/ML IJ SOLN: 20.0000 mg | Freq: Once | INTRAMUSCULAR | Status: DC

## 2013-12-19 MED ORDER — SODIUM CHLORIDE 0.9 % IJ SOLN
10.0000 mL | INTRAMUSCULAR | Status: DC | PRN
  Administered 2013-12-19: 10 mL

## 2013-12-19 MED ORDER — SODIUM CHLORIDE 0.9 % IV SOLN
20.0000 mg/m2 | Freq: Once | INTRAVENOUS | Status: AC
  Administered 2013-12-19: 42 mg via INTRAVENOUS
  Filled 2013-12-19: qty 42

## 2013-12-19 MED ORDER — POTASSIUM CHLORIDE 2 MEQ/ML IV SOLN
Freq: Once | INTRAVENOUS | Status: AC
  Administered 2013-12-19: 10:00:00 via INTRAVENOUS
  Filled 2013-12-19: qty 10

## 2013-12-19 NOTE — Procedures (Signed)
NAME:  Devon Bailey, Devon Bailey               ACCOUNT NO.:  [id number]  MEDICAL RECORD NO.:  [id number]  LOCATION:  RESP                          FACILITY:  [facility]  PHYSICIAN:  Ranferi Clingan L. Luan Pulling, M.D.DATE OF BIRTH:  [birth year]  DATE OF PROCEDURE: DATE OF DISCHARGE:  12/17/2013                           PULMONARY FUNCTION TEST   REASON FOR PULMONARY FUNCTION TESTING:  Chemotherapy.  1. Spirometry shows no ventilatory defect and no airflow obstruction. 2. Lung volumes are normal. 3. DLCO is normal. 4. Airway resistance is normal. 5. There is no significant bronchodilator improvement. 6. The study is normal.     Earmon Sherrow L. Luan Pulling, M.D.     EL[facility]/MEDQ  D:  12/18/2013  T:  12/19/2013  Job:  [id number]

## 2013-12-19 NOTE — Progress Notes (Signed)
Devon Bailey tolerated infusions well and without incident; verbalizes understanding for follow-up.  No distress noted at time of discharge and patient was discharged home with his mother.

## 2013-12-19 NOTE — Progress Notes (Signed)
Pt c/o eyes burning. Pt's mother instructed to purchase artificial tears and use as needed. I instructed her to let us know how that worked for patient.

## 2013-12-19 NOTE — Patient Instructions (Addendum)
[facility] Discharge Instructions for Patients Receiving Chemotherapy  Today you received the following chemotherapy agents: Cisplatin & Etoposide  To help prevent nausea and vomiting after your treatment, we encourage you to take your nausea medication: Metoclopramide & Prochlorperazine  For your burning eyes, please purchase artificial tears and use as needed. Let us know how that works for the dry/burning eyes.   If you develop nausea and vomiting that is not controlled by your nausea medication, call the clinic. Since it is the weekend, please go to ER if you develop a cough, cold, green/yellow mucous production, fever greater than 100.5, or chills with or without fever. See below also for signs and symptoms that need to be reported.  Your next appointment is scheduled for Tuesday December 23, 2013 @ 9:15. Don't forget to numb your port @ 8:15!  Your white blood cells are a little low (these are the cells that fight infection in our bodies). Don't forget that you need to stay away from sick people, stay out of large crowds. Wash your hands with warm soapy water for at least 30 seconds after going to the bathroom and as needed. Make sure that everything you eat is cooked well (meats, eggs). Do not eat anything raw including cookie dough.     BELOW ARE SYMPTOMS THAT SHOULD BE REPORTED IMMEDIATELY:  *FEVER GREATER THAN 100.5 F  *CHILLS WITH OR WITHOUT FEVER  NAUSEA AND VOMITING THAT IS NOT CONTROLLED WITH YOUR NAUSEA MEDICATION  *UNUSUAL SHORTNESS OF BREATH  *UNUSUAL BRUISING OR BLEEDING  TENDERNESS IN MOUTH AND THROAT WITH OR WITHOUT PRESENCE OF ULCERS  *URINARY PROBLEMS  *BOWEL PROBLEMS  UNUSUAL RASH  Items with * indicate a potential emergency and should be followed up as soon as possible.  Feel free to call the clinic you have any questions or concerns.      Neutropenia Neutropenia is a condition that occurs when the level of a certain type of white  blood cell (neutrophil) in your body becomes lower than normal. Neutrophils are made in the bone marrow and fight infections. These cells protect against bacteria and viruses. The fewer neutrophils you have, and the longer your body remains without them, the greater your risk of getting a severe infection becomes. CAUSES  The cause of neutropenia may be hard to determine. However, it is usually due to 3 main problems:   Decreased production of neutrophils. This may be due to:  Certain medicines such as chemotherapy.  Genetic problems.  Cancer.  Radiation treatments.  Vitamin deficiency.  Some pesticides.  Increased destruction of neutrophils. This may be due to:  Overwhelming infections.  Hemolytic anemia. This is when the body destroys its own blood cells.  Chemotherapy.  Neutrophils moving to areas of the body where they cannot fight infections. This may be due to:  Dialysis procedures.  Conditions where the spleen becomes enlarged. Neutrophils are held in the spleen and are not available to the rest of the body.  Overwhelming infections. The neutrophils are held in the area of the infection and are not available to the rest of the body. SYMPTOMS  There are no specific symptoms of neutropenia. The lack of neutrophils can result in an infection, and an infection can cause various problems. DIAGNOSIS  Diagnosis is made by a blood test. A complete blood count is performed. The normal level of neutrophils in human blood differs with age and race. Infants have lower counts than older children and adults. African Americans have lower  counts than Caucasians or Asians. The average adult level is 1500 cells/mm3 of blood. Neutrophil counts are interpreted as follows:  Greater than 1000 cells/mm3 gives normal protection against infection.  500 to 1000 cells/mm3 gives an increased risk for infection.  200 to 500 cells/mm3 is a greater risk for severe infection.  Lower than 200  cells/mm3 is a marked risk of infection. This may require hospitalization and treatment with antibiotic medicines. TREATMENT  Treatment depends on the underlying cause, severity, and presence of infections or symptoms. It also depends on your health. Your caregiver will discuss the treatment plan with you. Mild cases are often easily treated and have a good outcome. Preventative measures may also be started to limit your risk of infections. Treatment can include:  Taking antibiotics.  Stopping medicines that are known to cause neutropenia.  Correcting nutritional deficiencies by eating green vegetables to supply folic acid and taking vitamin B supplements.  Stopping exposure to pesticides if your neutropenia is related to pesticide exposure.  Taking a blood growth factor called sargramostim, pegfilgrastim, or filgrastim if you are undergoing chemotherapy for cancer. This stimulates white blood cell production.  Removal of the spleen if you have Felty's syndrome and have repeated infections. HOME CARE INSTRUCTIONS   Follow your caregiver's instructions about when you need to have blood work done.  Wash your hands often. Make sure others who come in contact with you also wash their hands.  Wash raw fruits and vegetables before eating them. They can carry bacteria and fungi.  Avoid people with colds or spreadable (contagious) diseases (chickenpox, herpes zoster, influenza).  Avoid large crowds.  Avoid construction areas. The dust can release fungus into the air.  Be cautious around children in daycare or school environments.  Take care of your respiratory system by coughing and deep breathing.  Bathe daily.  Protect your skin from cuts and burns.  Do not work in the garden or with flowers and plants.  Care for the mouth before and after meals by brushing with a soft toothbrush. If you have mucositis, do not use mouthwash. Mouthwash contains alcohol and can dry out the mouth even  more.  Clean the area between the genitals and the anus (perineal area) after urination and bowel movements. Women need to wipe from front to back.  Use a water soluble lubricant during sexual intercourse and practice good hygiene after. Do not have intercourse if you are severely neutropenic. Check with your caregiver for guidelines.  Exercise daily as tolerated.  Avoid people who were vaccinated with a live vaccine in the past 30 days. You should not receive live vaccines (polio, typhoid).  Do not provide direct care for pets. Avoid animal droppings. Do not clean litter boxes and bird cages.  Do not share food utensils.  Do not use tampons, enemas, or rectal suppositories unless directed by your caregiver.  Use an electric razor to remove hair.  Wash your hands after handling magazines, letters, and newspapers. SEEK IMMEDIATE MEDICAL CARE IF:   You have a fever.  You have chills or start to shake.  You feel nauseous or vomit.  You develop mouth sores.  You develop aches and pains.  You have redness and swelling around open wounds.  Your skin is warm to the touch.  You have pus coming from your wounds.  You develop swollen lymph nodes.  You feel weak or fatigued.  You develop red streaks on the skin. MAKE SURE YOU:  Understand these instructions.  Will watch  your condition.  Will get help right away if you are not doing well or get worse. Document Released: 04/28/2002 Document Revised: 01/29/2012 Document Reviewed: 05/26/2011 [facility] Patient Information 2014 [place], [place].

## 2013-12-23 ENCOUNTER — Encounter (HOSPITAL_COMMUNITY): Payer: Medicaid Other | Attending: Hematology and Oncology

## 2013-12-23 DIAGNOSIS — Z5111 Encounter for antineoplastic chemotherapy: Secondary | ICD-10-CM

## 2013-12-23 DIAGNOSIS — C6290 Malignant neoplasm of unspecified testis, unspecified whether descended or undescended: Secondary | ICD-10-CM

## 2013-12-23 DIAGNOSIS — R942 Abnormal results of pulmonary function studies: Secondary | ICD-10-CM | POA: Insufficient documentation

## 2013-12-23 DIAGNOSIS — F79 Unspecified intellectual disabilities: Secondary | ICD-10-CM | POA: Insufficient documentation

## 2013-12-23 DIAGNOSIS — C6292 Malignant neoplasm of left testis, unspecified whether descended or undescended: Secondary | ICD-10-CM

## 2013-12-23 LAB — CBC WITH DIFFERENTIAL/PLATELET
Basophils Absolute: 0 10*3/uL (ref 0.0–0.1)
Basophils Relative: 0 % (ref 0–1)
EOS ABS: 0 10*3/uL (ref 0.0–0.7)
EOS PCT: 0 % (ref 0–5)
HCT: 34.2 % — ABNORMAL LOW (ref 39.0–52.0)
Hemoglobin: 11.3 g/dL — ABNORMAL LOW (ref 13.0–17.0)
Lymphocytes Relative: 11 % — ABNORMAL LOW (ref 12–46)
Lymphs Abs: 0.8 10*3/uL (ref 0.7–4.0)
MCH: 24.8 pg — AB (ref 26.0–34.0)
MCHC: 33 g/dL (ref 30.0–36.0)
MCV: 75.2 fL — AB (ref 78.0–100.0)
MONOS PCT: 2 % — AB (ref 3–12)
Monocytes Absolute: 0.1 10*3/uL (ref 0.1–1.0)
Neutro Abs: 6.3 10*3/uL (ref 1.7–7.7)
Neutrophils Relative %: 87 % — ABNORMAL HIGH (ref 43–77)
PLATELETS: 353 10*3/uL (ref 150–400)
RBC: 4.55 MIL/uL (ref 4.22–5.81)
RDW: 20 % — ABNORMAL HIGH (ref 11.5–15.5)
WBC: 7.3 10*3/uL (ref 4.0–10.5)

## 2013-12-23 MED ORDER — SODIUM CHLORIDE 0.9 % IJ SOLN
10.0000 mL | INTRAMUSCULAR | Status: DC | PRN
  Administered 2013-12-23: 10 mL

## 2013-12-23 MED ORDER — SODIUM CHLORIDE 0.9 % IV SOLN
Freq: Once | INTRAVENOUS | Status: AC
  Administered 2013-12-23: 10:00:00 via INTRAVENOUS

## 2013-12-23 MED ORDER — PROCHLORPERAZINE MALEATE 10 MG PO TABS
10.0000 mg | ORAL_TABLET | Freq: Once | ORAL | Status: AC
  Administered 2013-12-23: 10 mg via ORAL
  Filled 2013-12-23: qty 1

## 2013-12-23 MED ORDER — HEPARIN SOD (PORK) LOCK FLUSH 100 UNIT/ML IV SOLN
500.0000 [IU] | Freq: Once | INTRAVENOUS | Status: AC | PRN
  Administered 2013-12-23: 500 [IU]
  Filled 2013-12-23: qty 5

## 2013-12-23 MED ORDER — SODIUM CHLORIDE 0.9 % IV SOLN
30.0000 [IU] | Freq: Once | INTRAVENOUS | Status: AC
  Administered 2013-12-23: 30 [IU] via INTRAVENOUS
  Filled 2013-12-23: qty 10

## 2013-12-23 NOTE — Patient Instructions (Signed)
[facility] Discharge Instructions for Patients Receiving Chemotherapy  Today you received the following chemotherapy agents Bleomycin. Your pulmonary function test has been reviewed by the doctor and he reports that your lung function continues to improve. The doctor has ordered for you to have another pulmonary function test just prior to beginning your next cycle of chemo. This is scheduled for Thursday February 12th. Be sure to keep the appointment. Continue with current chemotherapy as scheduled.   If you develop nausea and vomiting that is not controlled by your nausea medication, call the clinic. If it is after clinic hours your family physician or the after hours number for the clinic or go to the Emergency Department.   BELOW ARE SYMPTOMS THAT SHOULD BE REPORTED IMMEDIATELY:  *FEVER GREATER THAN 101.0 F  *CHILLS WITH OR WITHOUT FEVER  NAUSEA AND VOMITING THAT IS NOT CONTROLLED WITH YOUR NAUSEA MEDICATION  *UNUSUAL SHORTNESS OF BREATH  *UNUSUAL BRUISING OR BLEEDING  TENDERNESS IN MOUTH AND THROAT WITH OR WITHOUT PRESENCE OF ULCERS  *URINARY PROBLEMS  *BOWEL PROBLEMS  UNUSUAL RASH Items with * indicate a potential emergency and should be followed up as soon as possible.  One of the nurses will contact you 24 hours after your treatment. Please let the nurse know about any problems that you may have experienced. Feel free to call the clinic you have any questions or concerns. The clinic phone number is [phone].   I have been informed and understand all the instructions given to me. I know to contact the clinic, my physician, or go to the Emergency Department if any problems should occur. I do not have any questions at this time, but understand that I may call the clinic during office hours or the Patient Navigator at [phone] should I have any questions or need assistance in obtaining follow up care.    __________________________________________   _____________  __________ Signature of Patient or Authorized Representative            Date                   Time    __________________________________________ Nurse's Signature

## 2013-12-30 ENCOUNTER — Encounter (HOSPITAL_BASED_OUTPATIENT_CLINIC_OR_DEPARTMENT_OTHER): Payer: Medicaid Other

## 2013-12-30 VITALS — BP 116/77 | HR 56 | Temp 98.5°F | Resp 18 | Wt 192.8 lb

## 2013-12-30 DIAGNOSIS — F79 Unspecified intellectual disabilities: Secondary | ICD-10-CM

## 2013-12-30 DIAGNOSIS — Z5111 Encounter for antineoplastic chemotherapy: Secondary | ICD-10-CM

## 2013-12-30 DIAGNOSIS — C6290 Malignant neoplasm of unspecified testis, unspecified whether descended or undescended: Secondary | ICD-10-CM

## 2013-12-30 DIAGNOSIS — C6292 Malignant neoplasm of left testis, unspecified whether descended or undescended: Secondary | ICD-10-CM

## 2013-12-30 LAB — CBC WITH DIFFERENTIAL/PLATELET
BASOS ABS: 0 10*3/uL (ref 0.0–0.1)
Basophils Relative: 1 % (ref 0–1)
EOS ABS: 0 10*3/uL (ref 0.0–0.7)
EOS PCT: 0 % (ref 0–5)
HEMATOCRIT: 34.5 % — AB (ref 39.0–52.0)
Hemoglobin: 11.3 g/dL — ABNORMAL LOW (ref 13.0–17.0)
Lymphocytes Relative: 26 % (ref 12–46)
Lymphs Abs: 0.7 10*3/uL (ref 0.7–4.0)
MCH: 25.2 pg — AB (ref 26.0–34.0)
MCHC: 32.8 g/dL (ref 30.0–36.0)
MCV: 77 fL — AB (ref 78.0–100.0)
MONO ABS: 0.5 10*3/uL (ref 0.1–1.0)
Monocytes Relative: 18 % — ABNORMAL HIGH (ref 3–12)
Neutro Abs: 1.5 10*3/uL — ABNORMAL LOW (ref 1.7–7.7)
Neutrophils Relative %: 54 % (ref 43–77)
Platelets: 162 10*3/uL (ref 150–400)
RBC: 4.48 MIL/uL (ref 4.22–5.81)
RDW: 21.6 % — AB (ref 11.5–15.5)
WBC: 2.7 10*3/uL — AB (ref 4.0–10.5)

## 2013-12-30 MED ORDER — SODIUM CHLORIDE 0.9 % IV SOLN
30.0000 [IU] | Freq: Once | INTRAVENOUS | Status: AC
  Administered 2013-12-30: 30 [IU] via INTRAVENOUS
  Filled 2013-12-30: qty 10

## 2013-12-30 MED ORDER — SODIUM CHLORIDE 0.9 % IJ SOLN: 10.0000 mL | INTRAMUSCULAR | Status: DC | PRN

## 2013-12-30 MED ORDER — PROCHLORPERAZINE MALEATE 10 MG PO TABS
10.0000 mg | ORAL_TABLET | Freq: Once | ORAL | Status: AC
  Administered 2013-12-30: 10 mg via ORAL
  Filled 2013-12-30: qty 1

## 2013-12-30 MED ORDER — HEPARIN SOD (PORK) LOCK FLUSH 100 UNIT/ML IV SOLN
500.0000 [IU] | Freq: Once | INTRAVENOUS | Status: AC | PRN
  Administered 2013-12-30: 500 [IU]
  Filled 2013-12-30: qty 5

## 2013-12-30 MED ORDER — SODIUM CHLORIDE 0.9 % IV SOLN
Freq: Once | INTRAVENOUS | Status: AC
  Administered 2013-12-30: 10:00:00 via INTRAVENOUS

## 2013-12-30 NOTE — Progress Notes (Signed)
Tolerated well

## 2013-12-30 NOTE — Patient Instructions (Addendum)
[facility] Discharge Instructions for Patients Receiving Chemotherapy  Today you received the following chemotherapy agents Bleomycin Continue tylenol q 6 hrs for 2 days    If you develop nausea and vomiting that is not controlled by your nausea medication, call the clinic. If it is after clinic hours your family physician or the after hours number for the clinic or go to the Emergency Department.   BELOW ARE SYMPTOMS THAT SHOULD BE REPORTED IMMEDIATELY:  *FEVER GREATER THAN 101.0 F  *CHILLS WITH OR WITHOUT FEVER  NAUSEA AND VOMITING THAT IS NOT CONTROLLED WITH YOUR NAUSEA MEDICATION  *UNUSUAL SHORTNESS OF BREATH  *UNUSUAL BRUISING OR BLEEDING  TENDERNESS IN MOUTH AND THROAT WITH OR WITHOUT PRESENCE OF ULCERS  *URINARY PROBLEMS  *BOWEL PROBLEMS  UNUSUAL RASH Items with * indicate a potential emergency and should be followed up as soon as possible.   Please let the nurse know about any problems that you may have experienced. Feel free to call the clinic you have any questions or concerns. The clinic phone number is [phone].

## 2014-01-01 ENCOUNTER — Ambulatory Visit (HOSPITAL_COMMUNITY)
Admission: RE | Admit: 2014-01-01 | Discharge: 2014-01-01 | Disposition: A | Discharge status: Home / Self Care | Payer: MEDICAID | Source: Ambulatory Visit | Attending: Hematology and Oncology | Admitting: Hematology and Oncology

## 2014-01-01 DIAGNOSIS — R0989 Other specified symptoms and signs involving the circulatory and respiratory systems: Principal | ICD-10-CM | POA: Insufficient documentation

## 2014-01-01 DIAGNOSIS — C6290 Malignant neoplasm of unspecified testis, unspecified whether descended or undescended: Secondary | ICD-10-CM

## 2014-01-01 DIAGNOSIS — R0609 Other forms of dyspnea: Secondary | ICD-10-CM | POA: Insufficient documentation

## 2014-01-01 LAB — PULMONARY FUNCTION TEST
DL/VA % PRED: 107 %
DL/VA: 5.02 ml/min/mmHg/L
DLCO COR % PRED: 78 %
DLCO cor: 24.35 ml/min/mmHg
DLCO unc % pred: 78 %
DLCO unc: 24.35 ml/min/mmHg

## 2014-01-05 ENCOUNTER — Encounter (HOSPITAL_COMMUNITY): Payer: Self-pay

## 2014-01-05 ENCOUNTER — Encounter (HOSPITAL_BASED_OUTPATIENT_CLINIC_OR_DEPARTMENT_OTHER): Payer: Medicaid Other

## 2014-01-05 VITALS — BP 125/86 | HR 102 | Temp 98.4°F | Resp 16 | Wt 191.4 lb

## 2014-01-05 DIAGNOSIS — C6290 Malignant neoplasm of unspecified testis, unspecified whether descended or undescended: Secondary | ICD-10-CM

## 2014-01-05 DIAGNOSIS — Z5111 Encounter for antineoplastic chemotherapy: Secondary | ICD-10-CM

## 2014-01-05 DIAGNOSIS — F79 Unspecified intellectual disabilities: Secondary | ICD-10-CM

## 2014-01-05 DIAGNOSIS — C6292 Malignant neoplasm of left testis, unspecified whether descended or undescended: Secondary | ICD-10-CM

## 2014-01-05 DIAGNOSIS — R942 Abnormal results of pulmonary function studies: Secondary | ICD-10-CM

## 2014-01-05 DIAGNOSIS — C781 Secondary malignant neoplasm of mediastinum: Secondary | ICD-10-CM

## 2014-01-05 DIAGNOSIS — J310 Chronic rhinitis: Secondary | ICD-10-CM

## 2014-01-05 LAB — COMPREHENSIVE METABOLIC PANEL
ALBUMIN: 4 g/dL (ref 3.5–5.2)
ALT: 28 U/L (ref 0–53)
AST: 25 U/L (ref 0–37)
Alkaline Phosphatase: 81 U/L (ref 39–117)
BUN: 19 mg/dL (ref 6–23)
CHLORIDE: 105 meq/L (ref 96–112)
CO2: 26 mEq/L (ref 19–32)
Calcium: 9.7 mg/dL (ref 8.4–10.5)
Creatinine, Ser: 0.92 mg/dL (ref 0.50–1.35)
GFR calc Af Amer: >90 mL/min (ref >90)
GFR calc non Af Amer: >90 mL/min (ref >90)
Glucose, Bld: 96 mg/dL (ref 70–99)
POTASSIUM: 4.3 meq/L (ref 3.7–5.3)
SODIUM: 143 meq/L (ref 137–147)
TOTAL PROTEIN: 7.9 g/dL (ref 6.0–8.3)
Total Bilirubin: <0.2 mg/dL — ABNORMAL LOW (ref 0.3–1.2)

## 2014-01-05 LAB — CBC WITH DIFFERENTIAL/PLATELET
BASOS ABS: 0 10*3/uL (ref 0.0–0.1)
BASOS PCT: 1 % (ref 0–1)
EOS ABS: 0 10*3/uL (ref 0.0–0.7)
Eosinophils Relative: 1 % (ref 0–5)
HCT: 37.4 % — ABNORMAL LOW (ref 39.0–52.0)
HEMOGLOBIN: 12.6 g/dL — AB (ref 13.0–17.0)
Lymphocytes Relative: 41 % (ref 12–46)
Lymphs Abs: 1.3 10*3/uL (ref 0.7–4.0)
MCH: 26 pg (ref 26.0–34.0)
MCHC: 33.7 g/dL (ref 30.0–36.0)
MCV: 77.3 fL — ABNORMAL LOW (ref 78.0–100.0)
MONOS PCT: 33 % — AB (ref 3–12)
Monocytes Absolute: 1 10*3/uL (ref 0.1–1.0)
NEUTROS ABS: 0.7 10*3/uL — AB (ref 1.7–7.7)
NEUTROS PCT: 24 % — AB (ref 43–77)
Platelets: 327 10*3/uL (ref 150–400)
RBC: 4.84 MIL/uL (ref 4.22–5.81)
RDW: 22.1 % — AB (ref 11.5–15.5)
WBC: 3 10*3/uL — ABNORMAL LOW (ref 4.0–10.5)

## 2014-01-05 LAB — AFP TUMOR MARKER: AFP TUMOR MARKER: 7.4 ng/mL (ref 0.0–8.0)

## 2014-01-05 MED ORDER — CISPLATIN CHEMO INJECTION 100MG/100ML
20.0000 mg/m2 | Freq: Once | INTRAVENOUS | Status: AC
  Administered 2014-01-05: 42 mg via INTRAVENOUS
  Filled 2014-01-05: qty 42

## 2014-01-05 MED ORDER — SODIUM CHLORIDE 0.9 % IJ SOLN: 10.0000 mL | INTRAMUSCULAR | Status: DC | PRN

## 2014-01-05 MED ORDER — SODIUM CHLORIDE 0.9 % IV SOLN
100.0000 mg/m2 | Freq: Once | INTRAVENOUS | Status: AC
  Administered 2014-01-05: 210 mg via INTRAVENOUS
  Filled 2014-01-05: qty 10.5

## 2014-01-05 MED ORDER — DEXAMETHASONE SODIUM PHOSPHATE 20 MG/5ML IJ SOLN: 12.0000 mg | Freq: Once | INTRAMUSCULAR | Status: DC

## 2014-01-05 MED ORDER — SODIUM CHLORIDE 0.9 % IV SOLN
Freq: Once | INTRAVENOUS | Status: AC
  Administered 2014-01-05: 10:00:00 via INTRAVENOUS

## 2014-01-05 MED ORDER — HEPARIN SOD (PORK) LOCK FLUSH 100 UNIT/ML IV SOLN
500.0000 [IU] | Freq: Once | INTRAVENOUS | Status: AC | PRN
  Administered 2014-01-05: 500 [IU]

## 2014-01-05 MED ORDER — POTASSIUM CHLORIDE 2 MEQ/ML IV SOLN
Freq: Once | INTRAVENOUS | Status: AC
  Administered 2014-01-05: 10:00:00 via INTRAVENOUS
  Filled 2014-01-05: qty 10

## 2014-01-05 MED ORDER — PALONOSETRON HCL INJECTION 0.25 MG/5ML
0.2500 mg | Freq: Once | INTRAVENOUS | Status: AC
  Administered 2014-01-05: 0.25 mg via INTRAVENOUS
  Filled 2014-01-05: qty 5

## 2014-01-05 MED ORDER — SODIUM CHLORIDE 0.9 % IV SOLN: 150.0000 mg | Freq: Once | INTRAVENOUS | Status: DC

## 2014-01-05 MED ORDER — SODIUM CHLORIDE 0.9 % IV SOLN
Freq: Once | INTRAVENOUS | Status: AC
  Administered 2014-01-05: 12:00:00 via INTRAVENOUS
  Filled 2014-01-05: qty 5

## 2014-01-05 MED ORDER — HEPARIN SOD (PORK) LOCK FLUSH 100 UNIT/ML IV SOLN
INTRAVENOUS | Status: AC
  Filled 2014-01-05: qty 5

## 2014-01-05 NOTE — Patient Instructions (Addendum)
[facility] Discharge Instructions for Patients Receiving Chemotherapy  Today you received the following chemotherapy agents cisplatin and etoposide. We are discontinuing bleomycin because your lung function has decreased. You will be scheduled for a repeat breathing test next week, please don't miss the appointment. Report any issues/concerns as they arise.  If you develop nausea and vomiting that is not controlled by your nausea medication, call the clinic. If it is after clinic hours your family physician or the after hours number for the clinic or go to the Emergency Department.   BELOW ARE SYMPTOMS THAT SHOULD BE REPORTED IMMEDIATELY:  *FEVER GREATER THAN 101.0 F  *CHILLS WITH OR WITHOUT FEVER  NAUSEA AND VOMITING THAT IS NOT CONTROLLED WITH YOUR NAUSEA MEDICATION  *UNUSUAL SHORTNESS OF BREATH  *UNUSUAL BRUISING OR BLEEDING  TENDERNESS IN MOUTH AND THROAT WITH OR WITHOUT PRESENCE OF ULCERS  *URINARY PROBLEMS  *BOWEL PROBLEMS  UNUSUAL RASH Items with * indicate a potential emergency and should be followed up as soon as possible.  One of the nurses will contact you 24 hours after your treatment. Please let the nurse know about any problems that you may have experienced. Feel free to call the clinic you have any questions or concerns. The clinic phone number is [phone].   I have been informed and understand all the instructions given to me. I know to contact the clinic, my physician, or go to the Emergency Department if any problems should occur. I do not have any questions at this time, but understand that I may call the clinic during office hours or the Patient Navigator at [phone] should I have any questions or need assistance in obtaining follow up care.    __________________________________________  _____________  __________ Signature of Patient or Authorized Representative            Date                    Time    __________________________________________ Nurse's Signature

## 2014-01-05 NOTE — Progress Notes (Unsigned)
Per Dr.Formanek proceed with chemotherapy with ANC 700.

## 2014-01-05 NOTE — Progress Notes (Unsigned)
[facility]  OFFICE PROGRESS NOTE  [name], MD [address] [place] [address]  DIAGNOSIS: Cancer of testis, non-seminomatous, left - Plan: CBC with Differential, Comprehensive metabolic panel, AFP tumor marker, HCG, tumor marker  Mental retardation  Rhinitis  Abnormal PFTs(worsening DLCO) - Plan: Pulmonary function test  Mentally challenged - Plan: 0.9 %  sodium chloride infusion, dextrose 5 % and 0.45% NaCl 1,000 mL with potassium chloride 20 mEq, magnesium sulfate 12 mEq infusion  Cancer of testis, non-seminomatous - Plan: 0.9 %  sodium chloride infusion, dextrose 5 % and 0.45% NaCl 1,000 mL with potassium chloride 20 mEq, magnesium sulfate 12 mEq infusion  Chief Complaint  Patient presents with  . Stage IV testis cancer  . Worsening DLCO    CURRENT THERAPY: BEP to begin cycle #3 today  INTERVAL HISTORY: Devon Bailey 23 y.o. 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 He continues to do well excellent appetite and no episodes of peripheral paresthesias. He denies any PND, orthopnea, palpitations, or cough. Shortness of breath has abated. He denies any lower extremity swelling or redness, chest pain, headache, fever, or night sweats. Bowel movements are regular with no dysuria, hematuria, melena, hematochezia, skin rash, headache, or seizures.   MEDICAL HISTORY: Past Medical History  Diagnosis Date  . Anxiety   . Seasonal allergies   . Mentally challenged     mother states mentally "slow"  . 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    ALLERGIES:  is allergic to penicillins.  MEDICATIONS: has a current medication list which includes the following prescription(s): acetaminophen, acetaminophen, bleomycin  sulfate, cisplatin, etoposide, hydrocodone-acetaminophen, lidocaine-prilocaine, metoclopramide, and prochlorperazine, and the following Facility-Administered Medications: sodium chloride, dextrose 5 % and 0.45% NaCl 1,000 mL with potassium chloride 20 mEq, magnesium sulfate 12 mEq infusion, and lorazepam.  SURGICAL HISTORY:  Past Surgical History  Procedure Laterality Date  . Orchiectomy Left 08/07/2013    Procedure: RADICAL ORCHIECTOMY;  Surgeon: Marissa Nestle, MD;  Location: [facility];  Service: Urology;  Laterality: Left;  . Radical orchiectomy Left 08/07/2013  . Portacath placement Right     FAMILY HISTORY: family history is not on file.  SOCIAL HISTORY:  reports that he has never smoked. He has never used smokeless tobacco. He reports that he does not drink alcohol or use illicit drugs.  REVIEW OF SYSTEMS:  Other than that discussed above is noncontributory.  PHYSICAL EXAMINATION: ECOG PERFORMANCE STATUS: 1 - Symptomatic but completely ambulatory  Blood pressure 125/86, pulse 102, temperature 98.4 F (36.9 C), temperature source Oral, resp. rate 16, weight 191 lb 6.4 oz (86.818 kg).  GENERAL:alert, no distress and comfortable SKIN: skin color, texture, turgor are normal, no rashes or significant lesions EYES: PERLA; Conjunctiva are pink and non-injected, sclera clear OROPHARYNX:no exudate, no erythema on lips, buccal mucosa, or tongue. NECK: supple, thyroid normal size, non-tender, without nodularity. No masses CHEST: Normal AP diameter with light port in place. LYMPH:  no palpable lymphadenopathy in the cervical, axillary or inguinal LUNGS: clear to auscultation and percussion with normal breathing effort HEART: regular rate & rhythm and no murmurs. ABDOMEN:abdomen soft, non-tender and normal bowel sounds MUSCULOSKELETAL:no cyanosis of digits and no clubbing. Range of motion normal.  NEURO: alert & oriented x 3 with fluent speech, no  focal motor/sensory deficits   LABORATORY  DATA: Hospital Outpatient Visit on 01/01/2014  Component Date Value Ref Range Status  . DLCO unc 01/01/2014 24.35   Preliminary  . DLCO unc % pred 01/01/2014 78   Preliminary  . DLCO cor 01/01/2014 24.35   Preliminary  . DLCO cor % pred 01/01/2014 78   Preliminary  . DL/VA 01/01/2014 5.02   Preliminary  . DL/VA % pred 01/01/2014 107   Preliminary  Infusion on 12/30/2013  Component Date Value Ref Range Status  . WBC 12/30/2013 2.7* 4.0 - 10.5 K/uL Final  . RBC 12/30/2013 4.48  4.22 - 5.81 MIL/uL Final  . Hemoglobin 12/30/2013 11.3* 13.0 - 17.0 g/dL Final  . HCT 12/30/2013 34.5* 39.0 - 52.0 % Final  . MCV 12/30/2013 77.0* 78.0 - 100.0 fL Final  . MCH 12/30/2013 25.2* 26.0 - 34.0 pg Final  . MCHC 12/30/2013 32.8  30.0 - 36.0 g/dL Final  . RDW 12/30/2013 21.6* 11.5 - 15.5 % Final  . Platelets 12/30/2013 162  150 - 400 K/uL Final  . Neutrophils Relative % 12/30/2013 54  43 - 77 % Final  . Neutro Abs 12/30/2013 1.5* 1.7 - 7.7 K/uL Final  . Lymphocytes Relative 12/30/2013 26  12 - 46 % Final  . Lymphs Abs 12/30/2013 0.7  0.7 - 4.0 K/uL Final  . Monocytes Relative 12/30/2013 18* 3 - 12 % Final  . Monocytes Absolute 12/30/2013 0.5  0.1 - 1.0 K/uL Final  . Eosinophils Relative 12/30/2013 0  0 - 5 % Final  . Eosinophils Absolute 12/30/2013 0.0  0.0 - 0.7 K/uL Final  . Basophils Relative 12/30/2013 1  0 - 1 % Final  . Basophils Absolute 12/30/2013 0.0  0.0 - 0.1 K/uL Final  Infusion on 12/23/2013  Component Date Value Ref Range Status  . WBC 12/23/2013 7.3  4.0 - 10.5 K/uL Final  . RBC 12/23/2013 4.55  4.22 - 5.81 MIL/uL Final  . Hemoglobin 12/23/2013 11.3* 13.0 - 17.0 g/dL Final  . HCT 12/23/2013 34.2* 39.0 - 52.0 % Final  . MCV 12/23/2013 75.2* 78.0 - 100.0 fL Final  . MCH 12/23/2013 24.8* 26.0 - 34.0 pg Final  . MCHC 12/23/2013 33.0  30.0 - 36.0 g/dL Final  . RDW 12/23/2013 20.0* 11.5 - 15.5 % Final  . Platelets 12/23/2013 353  150 - 400 K/uL Final  . Neutrophils Relative % 12/23/2013  87* 43 - 77 % Final  . Neutro Abs 12/23/2013 6.3  1.7 - 7.7 K/uL Final  . Lymphocytes Relative 12/23/2013 11* 12 - 46 % Final  . Lymphs Abs 12/23/2013 0.8  0.7 - 4.0 K/uL Final  . Monocytes Relative 12/23/2013 2* 3 - 12 % Final  . Monocytes Absolute 12/23/2013 0.1  0.1 - 1.0 K/uL Final  . Eosinophils Relative 12/23/2013 0  0 - 5 % Final  . Eosinophils Absolute 12/23/2013 0.0  0.0 - 0.7 K/uL Final  . Basophils Relative 12/23/2013 0  0 - 1 % Final  . Basophils Absolute 12/23/2013 0.0  0.0 - 0.1 K/uL Final  Hospital Outpatient Visit on 12/17/2013  Component Date Value Ref Range Status  . FVC-Pre 12/17/2013 4.71   Preliminary  . FVC-%Pred-Pre 12/17/2013 86   Preliminary  . FVC-Post 12/17/2013 4.93   Preliminary  . FVC-%Pred-Post 12/17/2013 90   Preliminary  . FVC-%Change-Post 12/17/2013 4   Preliminary  . FEV1-Pre 12/17/2013 3.90   Preliminary  . FEV1-%Pred-Pre 12/17/2013 85   Preliminary  . FEV1-Post 12/17/2013 4.29  Preliminary  . FEV1-%Pred-Post 12/17/2013 94   Preliminary  . FEV1-%Change-Post 12/17/2013 9   Preliminary  . FEV6-Pre 12/17/2013 4.68   Preliminary  . FEV6-%Pred-Pre 12/17/2013 86   Preliminary  . FEV6-Post 12/17/2013 4.93   Preliminary  . FEV6-%Pred-Post 12/17/2013 90   Preliminary  . FEV6-%Change-Post 12/17/2013 5   Preliminary  . Pre FEV1/FVC ratio 12/17/2013 83   Preliminary  . FEV1FVC-%Pred-Pre 12/17/2013 98   Preliminary  . Post FEV1/FVC ratio 12/17/2013 87   Preliminary  . FEV1FVC-%Change-Post 12/17/2013 5   Preliminary  . Pre FEV6/FVC Ratio 12/17/2013 99   Preliminary  . FEV6FVC-%Pred-Pre 12/17/2013 99   Preliminary  . Post FEV6/FVC ratio 12/17/2013 100   Preliminary  . FEV6FVC-%Pred-Post 12/17/2013 100   Preliminary  . FEV6FVC-%Change-Post 12/17/2013 0   Preliminary  . FEF 25-75 Pre 12/17/2013 3.97   Preliminary  . FEF2575-%Pred-Pre 12/17/2013 81   Preliminary  . FEF 25-75 Post 12/17/2013 5.31   Preliminary  . FEF2575-%Pred-Post 12/17/2013 109   Preliminary   . FEF2575-%Change-Post 12/17/2013 33   Preliminary  . RV 12/17/2013 1.11   Preliminary  . RV % pred 12/17/2013 81   Preliminary  . TLC 12/17/2013 6.11   Preliminary  . TLC % pred 12/17/2013 91   Preliminary  . DLCO unc 12/17/2013 26.93   Preliminary  . DLCO unc % pred 12/17/2013 86   Preliminary  . DLCO cor 12/17/2013 26.93   Preliminary  . DLCO cor % pred 12/17/2013 86   Preliminary  . DL/VA 12/17/2013 4.68   Preliminary  . DL/VA % pred 12/17/2013 100   Preliminary  Infusion on 12/15/2013  Component Date Value Ref Range Status  . WBC 12/15/2013 3.9* 4.0 - 10.5 K/uL Final  . RBC 12/15/2013 5.19  4.22 - 5.81 MIL/uL Final  . Hemoglobin 12/15/2013 12.7* 13.0 - 17.0 g/dL Final  . HCT 12/15/2013 39.4  39.0 - 52.0 % Final  . MCV 12/15/2013 75.9* 78.0 - 100.0 fL Final  . MCH 12/15/2013 24.5* 26.0 - 34.0 pg Final  . MCHC 12/15/2013 32.2  30.0 - 36.0 g/dL Final  . RDW 12/15/2013 19.8* 11.5 - 15.5 % Final  . Platelets 12/15/2013 395  150 - 400 K/uL Final  . Neutrophils Relative % 12/15/2013 29* 43 - 77 % Final  . Neutro Abs 12/15/2013 1.1* 1.7 - 7.7 K/uL Final  . Lymphocytes Relative 12/15/2013 35  12 - 46 % Final  . Lymphs Abs 12/15/2013 1.4  0.7 - 4.0 K/uL Final  . Monocytes Relative 12/15/2013 33* 3 - 12 % Final  . Monocytes Absolute 12/15/2013 1.3* 0.1 - 1.0 K/uL Final  . Eosinophils Relative 12/15/2013 1  0 - 5 % Final  . Eosinophils Absolute 12/15/2013 0.0  0.0 - 0.7 K/uL Final  . Basophils Relative 12/15/2013 2* 0 - 1 % Final  . Basophils Absolute 12/15/2013 0.1  0.0 - 0.1 K/uL Final  . WBC Morphology 12/15/2013 MILD LEFT SHIFT (1-5% METAS, OCC MYELO, OCC BANDS)   Final  . Sodium 12/15/2013 139  137 - 147 mEq/L Final  . Potassium 12/15/2013 4.6  3.7 - 5.3 mEq/L Final  . Chloride 12/15/2013 100  96 - 112 mEq/L Final  . CO2 12/15/2013 28  19 - 32 mEq/L Final  . Glucose, Bld 12/15/2013 86  70 - 99 mg/dL Final  . BUN 12/15/2013 12  6 - 23 mg/dL Final  . Creatinine, Ser 12/15/2013  0.93  0.50 - 1.35 mg/dL Final  . Calcium 12/15/2013 9.6  8.4 - 10.5 mg/dL Final  . Total Protein 12/15/2013 8.2  6.0 - 8.3 g/dL Final  . Albumin 12/15/2013 3.9  3.5 - 5.2 g/dL Final  . AST 12/15/2013 22  0 - 37 U/L Final  . ALT 12/15/2013 25  0 - 53 U/L Final  . Alkaline Phosphatase 12/15/2013 90  39 - 117 U/L Final  . Total Bilirubin 12/15/2013 <0.2* 0.3 - 1.2 mg/dL Final  . GFR calc non Af Amer 12/15/2013 >90  >90 mL/min Final  . GFR calc Af Amer 12/15/2013 >90  >90 mL/min Final   Comment: (NOTE)                          The eGFR has been calculated using the CKD EPI equation.                          This calculation has not been validated in all clinical situations.                          eGFR's persistently <90 mL/min signify possible Chronic Kidney                          Disease.  . AFP-Tumor Marker 12/15/2013 17.7* 0.0 - 8.0 ng/mL Final   Comment: (NOTE)                          The [employer] Centaur AFP immunoassay method is used.  Results obtained                          with different assay methods or kits cannot be used interchangeably.                          AFP is a valuable aid in the management of nonseminomatous testicular                          cancer patients when used in conjunction with information available                          from the clinical evaluation and other diagnostic procedures.                          Increased serum AFP concentrations have also been observed in ataxia                          telangiectasia, hereditary tyrosinemia, primary hepatocellular                          carcinoma, teratocarcinoma, gastrointestinal tract cancers with and                          without liver metastases, and in benign hepatic conditions such as                          acute viral hepatitis, chronic active hepatitis, and cirrhosis.  This  result cannot be interpreted as absolute evidence of the presence or                           absence of malignant disease.  This result is not interpretable in                          pregnant females.                          Performed at [facility]  . Beta hCG, Tumor Marker 12/15/2013 406.6* <5.0 mIU/mL Final   Comment: (NOTE)                             Reference Range:                               Males: <5 mIU/mL                               Females: Nonpregnant: <5 mIU/mL                           Values from different assay methods may vary. The use of this                           assay to monitor or to diagnose patients with cancer or any                           condition other than pregnancy has not been approved by the FDA                           of the manufacturer of the assay.                           This HCG assay is performed on the [employer]                           platform. Use of an alternative HCG assay provides corroboration                           potentially useful in distinguising true versus false elevations                           of HCG. To further rule out false elevations of HCG, also                           consider measurement of HCG after pretreatment for human                           anti-animal [mouse] antibody (HCG, Total, with HAMA treatment),                           order code 19720.  Performed at [facility].  Infusion on 12/09/2013  Component Date Value Ref Range Status  . WBC 12/09/2013 1.1* 4.0 - 10.5 K/uL Final   Comment: RESULT REPEATED AND VERIFIED                          WHITE COUNT CONFIRMED ON SMEAR                          CRITICAL RESULT CALLED TO, READ BACK BY AND VERIFIED WITH:                          HAILEY BRAY RN ON [id number] AT 1030 BY RESSEGGER R  . RBC 12/09/2013 4.39  4.22 - 5.81 MIL/uL Final  . Hemoglobin 12/09/2013 10.4* 13.0 - 17.0 g/dL Final  . HCT 12/09/2013 32.5* 39.0 - 52.0 % Final  . MCV 12/09/2013 74.0* 78.0 - 100.0 fL Final   . MCH 12/09/2013 23.7* 26.0 - 34.0 pg Final  . MCHC 12/09/2013 32.0  30.0 - 36.0 g/dL Final  . RDW 12/09/2013 17.3* 11.5 - 15.5 % Final  . Platelets 12/09/2013 175  150 - 400 K/uL Final  . Neutrophils Relative % 12/09/2013 16* 43 - 77 % Final  . Neutro Abs 12/09/2013 0.2* 1.7 - 7.7 K/uL Final  . Lymphocytes Relative 12/09/2013 59* 12 - 46 % Final  . Lymphs Abs 12/09/2013 0.7  0.7 - 4.0 K/uL Final  . Monocytes Relative 12/09/2013 18* 3 - 12 % Final  . Monocytes Absolute 12/09/2013 0.2  0.1 - 1.0 K/uL Final  . Eosinophils Relative 12/09/2013 6* 0 - 5 % Final  . Eosinophils Absolute 12/09/2013 0.1  0.0 - 0.7 K/uL Final  . Basophils Relative 12/09/2013 1  0 - 1 % Final  . Basophils Absolute 12/09/2013 0.0  0.0 - 0.1 K/uL Final    PATHOLOGY: No new pathology.  Urinalysis    Component Value Date/Time   COLORURINE YELLOW 07/05/2013 2240   APPEARANCEUR CLEAR 07/05/2013 2240   LABSPEC 1.025 07/05/2013 2240   PHURINE 8.5* 07/05/2013 2240   GLUCOSEU NEGATIVE 07/05/2013 2240   HGBUR NEGATIVE 07/05/2013 [place] 07/05/2013 2240   KETONESUR NEGATIVE 07/05/2013 2240   PROTEINUR NEGATIVE 07/05/2013 2240   UROBILINOGEN 0.2 07/05/2013 2240   NITRITE NEGATIVE 07/05/2013 2240   LEUKOCYTESUR NEGATIVE 07/05/2013 2240    RADIOGRAPHIC STUDIES: No results found.  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  #2. Mild mental retardation..  #3. Worsening DLCO on most recent pulmonary function tests.  #4. Perennial rhinitis, controlled    PLAN:  #1. Discontinue bleomycin. #2. Cisplatin/ etoposide daily for 5 days this week if weather permits. #3. Repeat DLCO 01/15/2014. #4. Followup in 3 weeks to receive a fourth cycle of cisplatin/VP-16. #5. Explanation was provided to the patient and his mom who accompanied him today.   All questions were answered. The patient  knows to call the clinic with any problems, questions or concerns. We can certainly see the patient much sooner if necessary.   I spent {CHL ONC TIME VISIT - FAOZH:[id number]} counseling the patient face to face. The total time spent in the appointment was {CHL ONC TIME VISIT - EXBMW:[id number]}.    Doroteo Bradford, MD 01/05/2014 10:11 AM

## 2014-01-05 NOTE — Progress Notes (Signed)
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 Worsening DLCO    CURRENT THERAPY: BEP to begin cycle #3 today  INTERVAL HISTORY: Devon Bailey 23 y.o. 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 Breathing is easier with no cough, wheezing, shortness of breath, PND, orthopnea, palpitations, headache, or peripheral paresthesias. Bowel movements are regular with no dysuria, hematuria, melena, hematochezia, hematuria, lower extremity swelling or redness, skin rash, headache, or seizures.   MEDICAL HISTORY: Past Medical History  Diagnosis Date  . Anxiety   . Seasonal allergies   . Mentally challenged     mother states mentally "slow"  . 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    ALLERGIES:  is allergic to penicillins.  MEDICATIONS: has a current medication list which includes the following prescription(s): acetaminophen, acetaminophen, bleomycin sulfate, cisplatin, etoposide, lidocaine-prilocaine, hydrocodone-acetaminophen, metoclopramide, and prochlorperazine, and the following Facility-Administered Medications: sodium chloride, dextrose 5 % and 0.45% NaCl 1,000 mL with potassium chloride 20 mEq, magnesium sulfate 12 mEq infusion, and lorazepam.  SURGICAL HISTORY:  Past Surgical History  Procedure Laterality Date  . Orchiectomy Left 08/07/2013    Procedure: RADICAL ORCHIECTOMY;  Surgeon: Marissa Nestle, MD;  Location: [facility];  Service: Urology;  Laterality: Left;  . Radical orchiectomy Left  08/07/2013  . Portacath placement Right     FAMILY HISTORY: family history is not on file.  SOCIAL HISTORY:  reports that he has never smoked. He has never used smokeless tobacco. He reports that he does not drink alcohol or use illicit drugs.  REVIEW OF SYSTEMS:  Other than that discussed above is noncontributory.  PHYSICAL EXAMINATION: ECOG PERFORMANCE STATUS: 1 - Symptomatic but completely ambulatory  There were no vitals taken for this visit.  GENERAL:alert, no distress and comfortable SKIN: skin color, texture, turgor are normal, no rashes or significant lesions EYES: PERLA; Conjunctiva are pink and non-injected, sclera clear OROPHARYNX:no exudate, no erythema on lips, buccal mucosa, or tongue. NECK: supple, thyroid normal size, non-tender, without nodularity. No masses CHEST: LifePort in place. LYMPH:  no palpable lymphadenopathy in the cervical, axillary or inguinal LUNGS: clear to auscultation and percussion with normal breathing effort HEART: regular rate & rhythm and no murmurs. ABDOMEN:abdomen soft, non-tender and normal bowel sounds MUSCULOSKELETAL:no cyanosis of digits and no clubbing. Range of motion normal.  NEURO: alert & oriented x 3 with fluent speech, no focal motor/sensory deficits   LABORATORY DATA: Hospital Outpatient Visit on 01/01/2014  Component Date Value Ref Range Status  . DLCO unc 01/01/2014 24.35   Preliminary  . DLCO unc % pred 01/01/2014 78   Preliminary  . DLCO cor 01/01/2014 24.35   Preliminary  . DLCO cor % pred 01/01/2014 78   Preliminary  . DL/VA 01/01/2014 5.02   Preliminary  . DL/VA % pred 01/01/2014 107   Preliminary  Infusion on 12/30/2013  Component Date Value Ref Range Status  . WBC 12/30/2013 2.7* 4.0 - 10.5 K/uL Final  . RBC 12/30/2013 4.48  4.22 - 5.81 MIL/uL Final  . Hemoglobin 12/30/2013  11.3* 13.0 - 17.0 g/dL Final  . HCT 12/30/2013 34.5* 39.0 - 52.0 % Final  . MCV 12/30/2013 77.0* 78.0 - 100.0 fL Final  . MCH 12/30/2013  25.2* 26.0 - 34.0 pg Final  . MCHC 12/30/2013 32.8  30.0 - 36.0 g/dL Final  . RDW 12/30/2013 21.6* 11.5 - 15.5 % Final  . Platelets 12/30/2013 162  150 - 400 K/uL Final  . Neutrophils Relative % 12/30/2013 54  43 - 77 % Final  . Neutro Abs 12/30/2013 1.5* 1.7 - 7.7 K/uL Final  . Lymphocytes Relative 12/30/2013 26  12 - 46 % Final  . Lymphs Abs 12/30/2013 0.7  0.7 - 4.0 K/uL Final  . Monocytes Relative 12/30/2013 18* 3 - 12 % Final  . Monocytes Absolute 12/30/2013 0.5  0.1 - 1.0 K/uL Final  . Eosinophils Relative 12/30/2013 0  0 - 5 % Final  . Eosinophils Absolute 12/30/2013 0.0  0.0 - 0.7 K/uL Final  . Basophils Relative 12/30/2013 1  0 - 1 % Final  . Basophils Absolute 12/30/2013 0.0  0.0 - 0.1 K/uL Final  Infusion on 12/23/2013  Component Date Value Ref Range Status  . WBC 12/23/2013 7.3  4.0 - 10.5 K/uL Final  . RBC 12/23/2013 4.55  4.22 - 5.81 MIL/uL Final  . Hemoglobin 12/23/2013 11.3* 13.0 - 17.0 g/dL Final  . HCT 12/23/2013 34.2* 39.0 - 52.0 % Final  . MCV 12/23/2013 75.2* 78.0 - 100.0 fL Final  . MCH 12/23/2013 24.8* 26.0 - 34.0 pg Final  . MCHC 12/23/2013 33.0  30.0 - 36.0 g/dL Final  . RDW 12/23/2013 20.0* 11.5 - 15.5 % Final  . Platelets 12/23/2013 353  150 - 400 K/uL Final  . Neutrophils Relative % 12/23/2013 87* 43 - 77 % Final  . Neutro Abs 12/23/2013 6.3  1.7 - 7.7 K/uL Final  . Lymphocytes Relative 12/23/2013 11* 12 - 46 % Final  . Lymphs Abs 12/23/2013 0.8  0.7 - 4.0 K/uL Final  . Monocytes Relative 12/23/2013 2* 3 - 12 % Final  . Monocytes Absolute 12/23/2013 0.1  0.1 - 1.0 K/uL Final  . Eosinophils Relative 12/23/2013 0  0 - 5 % Final  . Eosinophils Absolute 12/23/2013 0.0  0.0 - 0.7 K/uL Final  . Basophils Relative 12/23/2013 0  0 - 1 % Final  . Basophils Absolute 12/23/2013 0.0  0.0 - 0.1 K/uL Final  Hospital Outpatient Visit on 12/17/2013  Component Date Value Ref Range Status  . FVC-Pre 12/17/2013 4.71   Preliminary  . FVC-%Pred-Pre 12/17/2013 86    Preliminary  . FVC-Post 12/17/2013 4.93   Preliminary  . FVC-%Pred-Post 12/17/2013 90   Preliminary  . FVC-%Change-Post 12/17/2013 4   Preliminary  . FEV1-Pre 12/17/2013 3.90   Preliminary  . FEV1-%Pred-Pre 12/17/2013 85   Preliminary  . FEV1-Post 12/17/2013 4.29   Preliminary  . FEV1-%Pred-Post 12/17/2013 94   Preliminary  . FEV1-%Change-Post 12/17/2013 9   Preliminary  . FEV6-Pre 12/17/2013 4.68   Preliminary  . FEV6-%Pred-Pre 12/17/2013 86   Preliminary  . FEV6-Post 12/17/2013 4.93   Preliminary  . FEV6-%Pred-Post 12/17/2013 90   Preliminary  . FEV6-%Change-Post 12/17/2013 5   Preliminary  . Pre FEV1/FVC ratio 12/17/2013 83   Preliminary  . FEV1FVC-%Pred-Pre 12/17/2013 98   Preliminary  . Post FEV1/FVC ratio 12/17/2013 87   Preliminary  . FEV1FVC-%Change-Post 12/17/2013 5   Preliminary  . Pre FEV6/FVC Ratio 12/17/2013 99   Preliminary  . FEV6FVC-%Pred-Pre 12/17/2013 99   Preliminary  .  Post FEV6/FVC ratio 12/17/2013 100   Preliminary  . FEV6FVC-%Pred-Post 12/17/2013 100   Preliminary  . FEV6FVC-%Change-Post 12/17/2013 0   Preliminary  . FEF 25-75 Pre 12/17/2013 3.97   Preliminary  . FEF2575-%Pred-Pre 12/17/2013 81   Preliminary  . FEF 25-75 Post 12/17/2013 5.31   Preliminary  . FEF2575-%Pred-Post 12/17/2013 109   Preliminary  . FEF2575-%Change-Post 12/17/2013 33   Preliminary  . RV 12/17/2013 1.11   Preliminary  . RV % pred 12/17/2013 81   Preliminary  . TLC 12/17/2013 6.11   Preliminary  . TLC % pred 12/17/2013 91   Preliminary  . DLCO unc 12/17/2013 26.93   Preliminary  . DLCO unc % pred 12/17/2013 86   Preliminary  . DLCO cor 12/17/2013 26.93   Preliminary  . DLCO cor % pred 12/17/2013 86   Preliminary  . DL/VA 12/17/2013 4.68   Preliminary  . DL/VA % pred 12/17/2013 100   Preliminary  Infusion on 12/15/2013  Component Date Value Ref Range Status  . WBC 12/15/2013 3.9* 4.0 - 10.5 K/uL Final  . RBC 12/15/2013 5.19  4.22 - 5.81 MIL/uL Final  . Hemoglobin 12/15/2013 12.7*  13.0 - 17.0 g/dL Final  . HCT 12/15/2013 39.4  39.0 - 52.0 % Final  . MCV 12/15/2013 75.9* 78.0 - 100.0 fL Final  . MCH 12/15/2013 24.5* 26.0 - 34.0 pg Final  . MCHC 12/15/2013 32.2  30.0 - 36.0 g/dL Final  . RDW 12/15/2013 19.8* 11.5 - 15.5 % Final  . Platelets 12/15/2013 395  150 - 400 K/uL Final  . Neutrophils Relative % 12/15/2013 29* 43 - 77 % Final  . Neutro Abs 12/15/2013 1.1* 1.7 - 7.7 K/uL Final  . Lymphocytes Relative 12/15/2013 35  12 - 46 % Final  . Lymphs Abs 12/15/2013 1.4  0.7 - 4.0 K/uL Final  . Monocytes Relative 12/15/2013 33* 3 - 12 % Final  . Monocytes Absolute 12/15/2013 1.3* 0.1 - 1.0 K/uL Final  . Eosinophils Relative 12/15/2013 1  0 - 5 % Final  . Eosinophils Absolute 12/15/2013 0.0  0.0 - 0.7 K/uL Final  . Basophils Relative 12/15/2013 2* 0 - 1 % Final  . Basophils Absolute 12/15/2013 0.1  0.0 - 0.1 K/uL Final  . WBC Morphology 12/15/2013 MILD LEFT SHIFT (1-5% METAS, OCC MYELO, OCC BANDS)   Final  . Sodium 12/15/2013 139  137 - 147 mEq/L Final  . Potassium 12/15/2013 4.6  3.7 - 5.3 mEq/L Final  . Chloride 12/15/2013 100  96 - 112 mEq/L Final  . CO2 12/15/2013 28  19 - 32 mEq/L Final  . Glucose, Bld 12/15/2013 86  70 - 99 mg/dL Final  . BUN 12/15/2013 12  6 - 23 mg/dL Final  . Creatinine, Ser 12/15/2013 0.93  0.50 - 1.35 mg/dL Final  . Calcium 12/15/2013 9.6  8.4 - 10.5 mg/dL Final  . Total Protein 12/15/2013 8.2  6.0 - 8.3 g/dL Final  . Albumin 12/15/2013 3.9  3.5 - 5.2 g/dL Final  . AST 12/15/2013 22  0 - 37 U/L Final  . ALT 12/15/2013 25  0 - 53 U/L Final  . Alkaline Phosphatase 12/15/2013 90  39 - 117 U/L Final  . Total Bilirubin 12/15/2013 <0.2* 0.3 - 1.2 mg/dL Final  . GFR calc non Af Amer 12/15/2013 >90  >90 mL/min Final  . GFR calc Af Amer 12/15/2013 >90  >90 mL/min Final   Comment: (NOTE)  The eGFR has been calculated using the CKD EPI equation.                          This calculation has not been validated in all clinical  situations.                          eGFR's persistently <90 mL/min signify possible Chronic Kidney                          Disease.  . AFP-Tumor Marker 12/15/2013 17.7* 0.0 - 8.0 ng/mL Final   Comment: (NOTE)                          The [employer] Centaur AFP immunoassay method is used.  Results obtained                          with different assay methods or kits cannot be used interchangeably.                          AFP is a valuable aid in the management of nonseminomatous testicular                          cancer patients when used in conjunction with information available                          from the clinical evaluation and other diagnostic procedures.                          Increased serum AFP concentrations have also been observed in ataxia                          telangiectasia, hereditary tyrosinemia, primary hepatocellular                          carcinoma, teratocarcinoma, gastrointestinal tract cancers with and                          without liver metastases, and in benign hepatic conditions such as                          acute viral hepatitis, chronic active hepatitis, and cirrhosis.  This                          result cannot be interpreted as absolute evidence of the presence or                          absence of malignant disease.  This result is not interpretable in                          pregnant females.                          Performed at [facility]  . Beta hCG, Tumor Marker 12/15/2013 406.6* <5.0 mIU/mL  Final   Comment: (NOTE)                             Reference Range:                               Males: <5 mIU/mL                               Females: Nonpregnant: <5 mIU/mL                           Values from different assay methods may vary. The use of this                           assay to monitor or to diagnose patients with cancer or any                           condition other than pregnancy has not been approved by the FDA                             of the manufacturer of the assay.                           This HCG assay is performed on the [employer]                           platform. Use of an alternative HCG assay provides corroboration                           potentially useful in distinguising true versus false elevations                           of HCG. To further rule out false elevations of HCG, also                           consider measurement of HCG after pretreatment for human                           anti-animal [mouse] antibody (HCG, Total, with HAMA treatment),                           order code 19720.                          Performed at [facility].  Infusion on 12/09/2013  Component Date Value Ref Range Status  . WBC 12/09/2013 1.1* 4.0 - 10.5 K/uL Final   Comment: RESULT REPEATED AND VERIFIED                          WHITE COUNT CONFIRMED ON SMEAR                          CRITICAL  RESULT CALLED TO, READ BACK BY AND VERIFIED WITH:                          Rosalio Macadamia RN ON [id number] AT 1030 BY RESSEGGER R  . RBC 12/09/2013 4.39  4.22 - 5.81 MIL/uL Final  . Hemoglobin 12/09/2013 10.4* 13.0 - 17.0 g/dL Final  . HCT 12/09/2013 32.5* 39.0 - 52.0 % Final  . MCV 12/09/2013 74.0* 78.0 - 100.0 fL Final  . MCH 12/09/2013 23.7* 26.0 - 34.0 pg Final  . MCHC 12/09/2013 32.0  30.0 - 36.0 g/dL Final  . RDW 12/09/2013 17.3* 11.5 - 15.5 % Final  . Platelets 12/09/2013 175  150 - 400 K/uL Final  . Neutrophils Relative % 12/09/2013 16* 43 - 77 % Final  . Neutro Abs 12/09/2013 0.2* 1.7 - 7.7 K/uL Final  . Lymphocytes Relative 12/09/2013 59* 12 - 46 % Final  . Lymphs Abs 12/09/2013 0.7  0.7 - 4.0 K/uL Final  . Monocytes Relative 12/09/2013 18* 3 - 12 % Final  . Monocytes Absolute 12/09/2013 0.2  0.1 - 1.0 K/uL Final  . Eosinophils Relative 12/09/2013 6* 0 - 5 % Final  . Eosinophils Absolute 12/09/2013 0.1  0.0 - 0.7 K/uL Final  . Basophils Relative 12/09/2013 1  0 -  1 % Final  . Basophils Absolute 12/09/2013 0.0  0.0 - 0.1 K/uL Final    PATHOLOGY: No new pathology.  Urinalysis    Component Value Date/Time   COLORURINE YELLOW 07/05/2013 2240   APPEARANCEUR CLEAR 07/05/2013 2240   LABSPEC 1.025 07/05/2013 2240   PHURINE 8.5* 07/05/2013 2240   GLUCOSEU NEGATIVE 07/05/2013 2240   HGBUR NEGATIVE 07/05/2013 [place] 07/05/2013 2240   KETONESUR NEGATIVE 07/05/2013 2240   PROTEINUR NEGATIVE 07/05/2013 2240   UROBILINOGEN 0.2 07/05/2013 2240   NITRITE NEGATIVE 07/05/2013 2240   LEUKOCYTESUR NEGATIVE 07/05/2013 2240    RADIOGRAPHIC STUDIES: No results found.  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  2. Perennial rhinitis.  3. Mild mental retardation.  4.  Worsening DLCO. #5. ANC of 700.    PLAN:  #1. Proceed with chemotherapy today. #2. Discontinue bleomycin. #3. Repeat DLCO on 01/15/2014. #4. Followup in 3 weeks to begin cycle #4 of treatment without bleomycin. #5. The patient and his mom were both in the room when discussion was held regarding Y. bleomycin was being discontinued and why an extra cycle of treatment was added without bleomycin.   All questions were answered. The patient knows to call the clinic with any problems, questions or concerns. We can certainly see the patient much sooner if necessary.   I spent 25 minutes counseling the patient face to face. The total time spent in the appointment was 30 minutes.    Doroteo Bradford, MD 01/05/2014 10:21 AM

## 2014-01-06 ENCOUNTER — Inpatient Hospital Stay (HOSPITAL_COMMUNITY): Payer: Medicaid Other

## 2014-01-06 ENCOUNTER — Telehealth (HOSPITAL_COMMUNITY): Payer: Self-pay | Admitting: Oncology

## 2014-01-06 NOTE — Telephone Encounter (Signed)
Devon Bailey's mother called stating they will not be able to make it in for today's chemotherapy appt.  Aware to keep tomorrow's appt as scheduled.

## 2014-01-07 ENCOUNTER — Encounter (HOSPITAL_BASED_OUTPATIENT_CLINIC_OR_DEPARTMENT_OTHER): Payer: Medicaid Other

## 2014-01-07 VITALS — BP 108/68 | HR 97 | Temp 97.0°F | Resp 16 | Wt 193.8 lb

## 2014-01-07 DIAGNOSIS — Z5111 Encounter for antineoplastic chemotherapy: Secondary | ICD-10-CM

## 2014-01-07 DIAGNOSIS — F79 Unspecified intellectual disabilities: Secondary | ICD-10-CM

## 2014-01-07 DIAGNOSIS — C6290 Malignant neoplasm of unspecified testis, unspecified whether descended or undescended: Secondary | ICD-10-CM

## 2014-01-07 MED ORDER — SODIUM CHLORIDE 0.9 % IV SOLN
20.0000 mg/m2 | Freq: Once | INTRAVENOUS | Status: AC
  Administered 2014-01-07: 42 mg via INTRAVENOUS
  Filled 2014-01-07: qty 42

## 2014-01-07 MED ORDER — DEXAMETHASONE SODIUM PHOSPHATE 10 MG/ML IJ SOLN: 20.0000 mg | Freq: Once | INTRAMUSCULAR | Status: DC

## 2014-01-07 MED ORDER — ETOPOSIDE CHEMO INJECTION 1 GM/50ML
100.0000 mg/m2 | Freq: Once | INTRAVENOUS | Status: AC
  Administered 2014-01-07: 210 mg via INTRAVENOUS
  Filled 2014-01-07: qty 10.5

## 2014-01-07 MED ORDER — SODIUM CHLORIDE 0.9 % IJ SOLN
10.0000 mL | INTRAMUSCULAR | Status: DC | PRN
  Administered 2014-01-07: 10 mL

## 2014-01-07 MED ORDER — POTASSIUM CHLORIDE 2 MEQ/ML IV SOLN
Freq: Once | INTRAVENOUS | Status: AC
  Administered 2014-01-07: 11:00:00 via INTRAVENOUS
  Filled 2014-01-07: qty 10

## 2014-01-07 MED ORDER — HEPARIN SOD (PORK) LOCK FLUSH 100 UNIT/ML IV SOLN
500.0000 [IU] | Freq: Once | INTRAVENOUS | Status: AC | PRN
Start: 2014-01-07 — End: 2014-01-07
  Administered 2014-01-07: 500 [IU]
  Filled 2014-01-07: qty 5

## 2014-01-07 MED ORDER — SODIUM CHLORIDE 0.9 % IV SOLN
20.0000 mg | Freq: Once | INTRAVENOUS | Status: AC
  Administered 2014-01-07: 20 mg via INTRAVENOUS
  Filled 2014-01-07: qty 2

## 2014-01-07 MED ORDER — SODIUM CHLORIDE 0.9 % IV SOLN
Freq: Once | INTRAVENOUS | Status: AC
  Administered 2014-01-07: 10:00:00 via INTRAVENOUS

## 2014-01-07 NOTE — Progress Notes (Signed)
Devon Bailey Tolerated chemotherapy well today.  Discharged without any complications today

## 2014-01-08 ENCOUNTER — Encounter (HOSPITAL_BASED_OUTPATIENT_CLINIC_OR_DEPARTMENT_OTHER): Payer: Medicaid Other

## 2014-01-08 VITALS — BP 125/61 | HR 81 | Resp 18 | Wt 194.8 lb

## 2014-01-08 DIAGNOSIS — C6290 Malignant neoplasm of unspecified testis, unspecified whether descended or undescended: Secondary | ICD-10-CM

## 2014-01-08 DIAGNOSIS — Z5111 Encounter for antineoplastic chemotherapy: Secondary | ICD-10-CM

## 2014-01-08 DIAGNOSIS — F79 Unspecified intellectual disabilities: Secondary | ICD-10-CM

## 2014-01-08 LAB — BETA HCG QUANT (REF LAB): Beta hCG, Tumor Marker: 14.8 m[IU]/mL — ABNORMAL HIGH (ref <5.0)

## 2014-01-08 MED ORDER — SODIUM CHLORIDE 0.9 % IV SOLN
Freq: Once | INTRAVENOUS | Status: AC
  Administered 2014-01-08: 10:00:00 via INTRAVENOUS

## 2014-01-08 MED ORDER — PALONOSETRON HCL INJECTION 0.25 MG/5ML
0.2500 mg | Freq: Once | INTRAVENOUS | Status: AC
  Administered 2014-01-08: 0.25 mg via INTRAVENOUS
  Filled 2014-01-08: qty 5

## 2014-01-08 MED ORDER — HEPARIN SOD (PORK) LOCK FLUSH 100 UNIT/ML IV SOLN
500.0000 [IU] | Freq: Once | INTRAVENOUS | Status: AC | PRN
  Administered 2014-01-08: 500 [IU]

## 2014-01-08 MED ORDER — HEPARIN SOD (PORK) LOCK FLUSH 100 UNIT/ML IV SOLN
INTRAVENOUS | Status: AC
  Filled 2014-01-08: qty 5

## 2014-01-08 MED ORDER — DEXAMETHASONE SODIUM PHOSPHATE 10 MG/ML IJ SOLN: 20.0000 mg | Freq: Once | INTRAMUSCULAR | Status: DC

## 2014-01-08 MED ORDER — SODIUM CHLORIDE 0.9 % IV SOLN
20.0000 mg | Freq: Once | INTRAVENOUS | Status: AC
  Administered 2014-01-08: 20 mg via INTRAVENOUS
  Filled 2014-01-08: qty 2

## 2014-01-08 MED ORDER — POTASSIUM CHLORIDE 2 MEQ/ML IV SOLN
Freq: Once | INTRAVENOUS | Status: AC
  Administered 2014-01-08: 10:00:00 via INTRAVENOUS
  Filled 2014-01-08: qty 10

## 2014-01-08 MED ORDER — SODIUM CHLORIDE 0.9 % IV SOLN
100.0000 mg/m2 | Freq: Once | INTRAVENOUS | Status: AC
  Administered 2014-01-08: 210 mg via INTRAVENOUS
  Filled 2014-01-08: qty 10.5

## 2014-01-08 MED ORDER — SODIUM CHLORIDE 0.9 % IJ SOLN: 10.0000 mL | INTRAMUSCULAR | Status: DC | PRN

## 2014-01-08 MED ORDER — SODIUM CHLORIDE 0.9 % IV SOLN
20.0000 mg/m2 | Freq: Once | INTRAVENOUS | Status: AC
  Administered 2014-01-08: 42 mg via INTRAVENOUS
  Filled 2014-01-08: qty 42

## 2014-01-09 ENCOUNTER — Encounter (HOSPITAL_BASED_OUTPATIENT_CLINIC_OR_DEPARTMENT_OTHER): Payer: Medicaid Other

## 2014-01-09 VITALS — BP 132/74 | HR 81 | Temp 97.7°F | Resp 18 | Wt 200.0 lb

## 2014-01-09 DIAGNOSIS — Z5111 Encounter for antineoplastic chemotherapy: Secondary | ICD-10-CM

## 2014-01-09 DIAGNOSIS — F79 Unspecified intellectual disabilities: Secondary | ICD-10-CM

## 2014-01-09 DIAGNOSIS — C6290 Malignant neoplasm of unspecified testis, unspecified whether descended or undescended: Secondary | ICD-10-CM

## 2014-01-09 MED ORDER — POTASSIUM CHLORIDE 2 MEQ/ML IV SOLN
Freq: Once | INTRAVENOUS | Status: AC
  Administered 2014-01-09: 10:00:00 via INTRAVENOUS
  Filled 2014-01-09: qty 10

## 2014-01-09 MED ORDER — SODIUM CHLORIDE 0.9 % IV SOLN
20.0000 mg/m2 | Freq: Once | INTRAVENOUS | Status: AC
  Administered 2014-01-09: 42 mg via INTRAVENOUS
  Filled 2014-01-09: qty 42

## 2014-01-09 MED ORDER — ETOPOSIDE CHEMO INJECTION 1 GM/50ML
100.0000 mg/m2 | Freq: Once | INTRAVENOUS | Status: AC
  Administered 2014-01-09: 210 mg via INTRAVENOUS
  Filled 2014-01-09: qty 10.5

## 2014-01-09 MED ORDER — DEXAMETHASONE SODIUM PHOSPHATE 10 MG/ML IJ SOLN: 20.0000 mg | Freq: Once | INTRAMUSCULAR | Status: DC

## 2014-01-09 MED ORDER — HEPARIN SOD (PORK) LOCK FLUSH 100 UNIT/ML IV SOLN
500.0000 [IU] | Freq: Once | INTRAVENOUS | Status: AC | PRN
  Administered 2014-01-09: 500 [IU]
  Filled 2014-01-09: qty 5

## 2014-01-09 MED ORDER — SODIUM CHLORIDE 0.9 % IJ SOLN: 10.0000 mL | INTRAMUSCULAR | Status: DC | PRN

## 2014-01-09 MED ORDER — SODIUM CHLORIDE 0.9 % IV SOLN
20.0000 mg | Freq: Once | INTRAVENOUS | Status: AC
  Administered 2014-01-09: 20 mg via INTRAVENOUS
  Filled 2014-01-09: qty 2

## 2014-01-09 MED ORDER — SODIUM CHLORIDE 0.9 % IV SOLN
Freq: Once | INTRAVENOUS | Status: AC
  Administered 2014-01-09: 10:00:00 via INTRAVENOUS

## 2014-01-09 NOTE — Progress Notes (Signed)
.  Devon Bailey arrived for treatment. No c/o voiced. Tolerated well

## 2014-01-09 NOTE — Patient Instructions (Signed)
[facility] Discharge Instructions for Patients Receiving Chemotherapy  Today you received the following chemotherapy agents cisplatin and etopiside Return Monday for day 5 of chemo To help prevent nausea and vomiting after your treatment, we encourage you to take your nausea medication  If you develop nausea and vomiting that is not controlled by your nausea medication, call the clinic. If it is after clinic hours your family physician or the after hours number for the clinic or go to the Emergency Department.   BELOW ARE SYMPTOMS THAT SHOULD BE REPORTED IMMEDIATELY:  *FEVER GREATER THAN 101.0 F  *CHILLS WITH OR WITHOUT FEVER  NAUSEA AND VOMITING THAT IS NOT CONTROLLED WITH YOUR NAUSEA MEDICATION  *UNUSUAL SHORTNESS OF BREATH  *UNUSUAL BRUISING OR BLEEDING  TENDERNESS IN MOUTH AND THROAT WITH OR WITHOUT PRESENCE OF ULCERS  *URINARY PROBLEMS  *BOWEL PROBLEMS  UNUSUAL RASH Items with * indicate a potential emergency and should be followed up as soon as possible.  One of the nurses will contact you 24 hours after your treatment. Please let the nurse know about any problems that you may have experienced. Feel free to call the clinic you have any questions or concerns. The clinic phone number is [phone].

## 2014-01-12 ENCOUNTER — Encounter (HOSPITAL_BASED_OUTPATIENT_CLINIC_OR_DEPARTMENT_OTHER): Payer: Medicaid Other

## 2014-01-12 ENCOUNTER — Ambulatory Visit (HOSPITAL_COMMUNITY)
Admission: RE | Admit: 2014-01-12 | Discharge: 2014-01-12 | Disposition: A | Discharge status: Home / Self Care | Payer: Medicaid Other | Source: Ambulatory Visit | Attending: Hematology and Oncology | Admitting: Hematology and Oncology

## 2014-01-12 DIAGNOSIS — R942 Abnormal results of pulmonary function studies: Secondary | ICD-10-CM

## 2014-01-12 DIAGNOSIS — Z5111 Encounter for antineoplastic chemotherapy: Secondary | ICD-10-CM

## 2014-01-12 DIAGNOSIS — R0609 Other forms of dyspnea: Secondary | ICD-10-CM | POA: Insufficient documentation

## 2014-01-12 DIAGNOSIS — F79 Unspecified intellectual disabilities: Secondary | ICD-10-CM

## 2014-01-12 DIAGNOSIS — C6290 Malignant neoplasm of unspecified testis, unspecified whether descended or undescended: Secondary | ICD-10-CM | POA: Insufficient documentation

## 2014-01-12 DIAGNOSIS — C6292 Malignant neoplasm of left testis, unspecified whether descended or undescended: Secondary | ICD-10-CM

## 2014-01-12 DIAGNOSIS — R0989 Other specified symptoms and signs involving the circulatory and respiratory systems: Principal | ICD-10-CM | POA: Insufficient documentation

## 2014-01-12 LAB — CBC WITH DIFFERENTIAL/PLATELET
Basophils Absolute: 0 10*3/uL (ref 0.0–0.1)
Basophils Relative: 0 % (ref 0–1)
Eosinophils Absolute: 0.1 10*3/uL (ref 0.0–0.7)
Eosinophils Relative: 1 % (ref 0–5)
HCT: 36.9 % — ABNORMAL LOW (ref 39.0–52.0)
Hemoglobin: 12.2 g/dL — ABNORMAL LOW (ref 13.0–17.0)
Lymphocytes Relative: 18 % (ref 12–46)
Lymphs Abs: 1.2 10*3/uL (ref 0.7–4.0)
MCH: 25.5 pg — ABNORMAL LOW (ref 26.0–34.0)
MCHC: 33.1 g/dL (ref 30.0–36.0)
MCV: 77 fL — ABNORMAL LOW (ref 78.0–100.0)
Monocytes Absolute: 0.1 10*3/uL (ref 0.1–1.0)
Monocytes Relative: 2 % — ABNORMAL LOW (ref 3–12)
Neutro Abs: 5.5 10*3/uL (ref 1.7–7.7)
Neutrophils Relative %: 79 % — ABNORMAL HIGH (ref 43–77)
Platelets: 415 10*3/uL — ABNORMAL HIGH (ref 150–400)
RBC: 4.79 MIL/uL (ref 4.22–5.81)
RDW: 22.1 % — ABNORMAL HIGH (ref 11.5–15.5)
WBC: 6.9 10*3/uL (ref 4.0–10.5)

## 2014-01-12 LAB — PULMONARY FUNCTION TEST
DL/VA % pred: 110 %
DL/VA: 5.14 ml/min/mmHg/L
DLCO COR % PRED: 78 %
DLCO UNC % PRED: 78 %
DLCO cor: 24.47 ml/min/mmHg
DLCO unc: 24.47 ml/min/mmHg

## 2014-01-12 MED ORDER — SODIUM CHLORIDE 0.9 % IV SOLN
Freq: Once | INTRAVENOUS | Status: AC
  Administered 2014-01-12: 10:00:00 via INTRAVENOUS

## 2014-01-12 MED ORDER — SODIUM CHLORIDE 0.9 % IV SOLN
100.0000 mg/m2 | Freq: Once | INTRAVENOUS | Status: AC
  Administered 2014-01-12: 210 mg via INTRAVENOUS
  Filled 2014-01-12: qty 10.5

## 2014-01-12 MED ORDER — DEXAMETHASONE SODIUM PHOSPHATE 10 MG/ML IJ SOLN: 20.0000 mg | Freq: Once | INTRAMUSCULAR | Status: DC

## 2014-01-12 MED ORDER — HEPARIN SOD (PORK) LOCK FLUSH 100 UNIT/ML IV SOLN
500.0000 [IU] | Freq: Once | INTRAVENOUS | Status: AC | PRN
  Administered 2014-01-12: 500 [IU]
  Filled 2014-01-12: qty 5

## 2014-01-12 MED ORDER — SODIUM CHLORIDE 0.9 % IV SOLN
20.0000 mg/m2 | Freq: Once | INTRAVENOUS | Status: AC
  Administered 2014-01-12: 42 mg via INTRAVENOUS
  Filled 2014-01-12: qty 42

## 2014-01-12 MED ORDER — POTASSIUM CHLORIDE 2 MEQ/ML IV SOLN
Freq: Once | INTRAVENOUS | Status: AC
  Administered 2014-01-12: 10:00:00 via INTRAVENOUS
  Filled 2014-01-12: qty 10

## 2014-01-12 MED ORDER — PALONOSETRON HCL INJECTION 0.25 MG/5ML
0.2500 mg | Freq: Once | INTRAVENOUS | Status: AC
  Administered 2014-01-12: 0.25 mg via INTRAVENOUS
  Filled 2014-01-12: qty 5

## 2014-01-12 MED ORDER — SODIUM CHLORIDE 0.9 % IV SOLN
20.0000 mg | Freq: Once | INTRAVENOUS | Status: AC
  Administered 2014-01-12: 20 mg via INTRAVENOUS
  Filled 2014-01-12: qty 2

## 2014-01-12 NOTE — Progress Notes (Signed)
Tolerated well

## 2014-01-13 ENCOUNTER — Inpatient Hospital Stay (HOSPITAL_COMMUNITY): Payer: Medicaid Other

## 2014-01-15 NOTE — Procedures (Signed)
NAME:  Devon Bailey, Devon Bailey                    ACCOUNT NO.:  MEDICAL RECORD NO.:  [id number]  LOCATION:                                 FACILITY:  PHYSICIAN:  Jenel Gierke L. Luan Pulling, M.D.DATE OF BIRTH:  26-Jul-1991  DATE OF PROCEDURE: DATE OF DISCHARGE:                           PULMONARY FUNCTION TEST   REASON FOR PULMONARY FUNCTION TESTING:  Testicular cancer.  1. DLCO is mildly reduced.     Devon Bailey L. Luan Pulling, M.D.     ELH/MEDQ  D:  01/14/2014  T:  01/15/2014  Job:  [id number]  cc:   Dr. Rolla Etienne [facility]

## 2014-01-15 NOTE — Procedures (Signed)
NAME:  TERUO, STILLEY                    ACCOUNT NO.:  MEDICAL RECORD NO.:  [id number]  LOCATION:                                 FACILITY:  PHYSICIAN:  Jaree Trinka L. Luan Pulling, M.D.DATE OF BIRTH:  [birth year]  DATE OF PROCEDURE: DATE OF DISCHARGE:                           PULMONARY FUNCTION TEST   Dictation ended at this point.     Dekendrick Uzelac L. Luan Pulling, M.D.     ELH/MEDQ  D:  01/14/2014  T:  01/15/2014  Job:  [id number]

## 2014-01-15 NOTE — Procedures (Signed)
NAME:  Devon Bailey, Devon Bailey                    ACCOUNT NO.:  MEDICAL RECORD NO.:  [id number]  LOCATION:                                 FACILITY:  PHYSICIAN:  Viha Kriegel L. Luan Pulling, M.D.DATE OF BIRTH:  06-18-1991  DATE OF PROCEDURE: DATE OF DISCHARGE:                           PULMONARY FUNCTION TEST   1. Spirometry is normal. 2. Lung volumes are normal. 3. Airway resistance is normal. 4. DLCO is normal. 5. This is a normal pulmonary function test.     [name] L. Luan Pulling, M.D.     ELH/MEDQ  D:  01/14/2014  T:  01/15/2014  Job:  [id number]  cc:   Manon Hilding. Sheldon Silvan III, PA-C Fax: [phone]

## 2014-01-20 ENCOUNTER — Inpatient Hospital Stay (HOSPITAL_COMMUNITY): Payer: Medicaid Other

## 2014-01-20 ENCOUNTER — Encounter (HOSPITAL_COMMUNITY): Payer: Self-pay | Attending: Hematology and Oncology

## 2014-01-20 ENCOUNTER — Encounter (HOSPITAL_COMMUNITY): Payer: Self-pay

## 2014-01-20 VITALS — BP 112/77 | HR 100 | Temp 99.0°F | Resp 18 | Wt 198.3 lb

## 2014-01-20 DIAGNOSIS — C6292 Malignant neoplasm of left testis, unspecified whether descended or undescended: Secondary | ICD-10-CM

## 2014-01-20 DIAGNOSIS — R209 Unspecified disturbances of skin sensation: Secondary | ICD-10-CM | POA: Insufficient documentation

## 2014-01-20 DIAGNOSIS — F79 Unspecified intellectual disabilities: Secondary | ICD-10-CM | POA: Insufficient documentation

## 2014-01-20 DIAGNOSIS — J310 Chronic rhinitis: Secondary | ICD-10-CM

## 2014-01-20 DIAGNOSIS — C781 Secondary malignant neoplasm of mediastinum: Secondary | ICD-10-CM | POA: Insufficient documentation

## 2014-01-20 DIAGNOSIS — C6290 Malignant neoplasm of unspecified testis, unspecified whether descended or undescended: Secondary | ICD-10-CM | POA: Insufficient documentation

## 2014-01-20 DIAGNOSIS — J309 Allergic rhinitis, unspecified: Secondary | ICD-10-CM | POA: Insufficient documentation

## 2014-01-20 LAB — CBC WITH DIFFERENTIAL/PLATELET
Basophils Absolute: 0 10*3/uL (ref 0.0–0.1)
Basophils Relative: 0 % (ref 0–1)
Eosinophils Absolute: 0 10*3/uL (ref 0.0–0.7)
Eosinophils Relative: 0 % (ref 0–5)
HCT: 36.6 % — ABNORMAL LOW (ref 39.0–52.0)
HEMOGLOBIN: 11.9 g/dL — AB (ref 13.0–17.0)
LYMPHS ABS: 1 10*3/uL (ref 0.7–4.0)
LYMPHS PCT: 18 % (ref 12–46)
MCH: 25.5 pg — ABNORMAL LOW (ref 26.0–34.0)
MCHC: 32.5 g/dL (ref 30.0–36.0)
MCV: 78.4 fL (ref 78.0–100.0)
MONOS PCT: 12 % (ref 3–12)
Monocytes Absolute: 0.7 10*3/uL (ref 0.1–1.0)
Neutro Abs: 3.8 10*3/uL (ref 1.7–7.7)
Neutrophils Relative %: 70 % (ref 43–77)
PLATELETS: 154 10*3/uL (ref 150–400)
RBC: 4.67 MIL/uL (ref 4.22–5.81)
RDW: 22.2 % — ABNORMAL HIGH (ref 11.5–15.5)
WBC: 5.4 10*3/uL (ref 4.0–10.5)

## 2014-01-20 MED ORDER — FLUTICASONE PROPIONATE 50 MCG/ACT NA SUSP: NASAL | Status: DC

## 2014-01-20 NOTE — Progress Notes (Signed)
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 Stage IV testicular cancer, nonseminomatous    CURRENT THERAPY: BEP chemotherapy, 3 cycles with bleomycin discontinued in the third cycle because of abnormalities in DLCO.  INTERVAL HISTORY: KIRE FERG 23 y.o. male returns for followup prior to receiving cycle #4 of chemotherapy for stage IV nonseminomatous testicular cancer.  He continues to do well except for posterior nasal drip with occasional cough. He denies any wheezing, chest pain, abdominal pain, nausea, vomiting, diarrhea, constipation, melena, hematochezia, hematuria, lower extremity swelling or redness, peripheral paresthesias, headache, or seizures.  MEDICAL HISTORY: Past Medical History  Diagnosis Date  . Anxiety   . Seasonal allergies   . Mentally challenged     mother states mentally "slow"  . 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    ALLERGIES:  is allergic to penicillins.  MEDICATIONS: has a current medication list which includes the following prescription(s): acetaminophen, cisplatin, etoposide, hydrocodone-acetaminophen, lidocaine-prilocaine, metoclopramide, prochlorperazine, acetaminophen, and fluticasone, and the following Facility-Administered Medications: lorazepam and sodium chloride.  SURGICAL HISTORY:  Past Surgical History  Procedure Laterality Date  . Orchiectomy Left 08/07/2013   Procedure: RADICAL ORCHIECTOMY;  Surgeon: Marissa Nestle, MD;  Location: [facility];  Service: Urology;  Laterality: Left;  . Radical orchiectomy Left 08/07/2013  . Portacath placement Right     FAMILY HISTORY: family history is not on file.  SOCIAL HISTORY:  reports that he has never smoked. He has never used smokeless tobacco. He reports that he does not drink alcohol or use illicit drugs.  REVIEW OF SYSTEMS:  Other than that discussed above is noncontributory.  PHYSICAL EXAMINATION: ECOG PERFORMANCE STATUS: 1 - Symptomatic but completely ambulatory  Blood pressure 112/77, pulse 100, temperature 99 F (37.2 C), temperature source Oral, resp. rate 18, weight 198 lb 4.8 oz (89.948 kg).  GENERAL:alert, no distress and comfortable. Alopecia the scalp. SKIN: skin color, texture, turgor are normal, no rashes or significant lesions EYES: PERLA; Conjunctiva are pink and non-injected, sclera clear OROPHARYNX:no exudate, no erythema on lips, buccal mucosa, or tongue. NECK: supple, thyroid normal size, non-tender, without nodularity. No masses CHEST: LifePort in place. No gynecomastia. LYMPH:  no palpable lymphadenopathy in the cervical, axillary or inguinal LUNGS: clear to auscultation and percussion with normal breathing effort HEART: regular rate & rhythm and no murmurs. ABDOMEN:abdomen soft, non-tender and normal bowel sounds MUSCULOSKELETAL:no cyanosis of digits and no clubbing. Range of motion normal.  NEURO: alert & oriented x 3 with fluent speech, no focal motor/sensory deficits   LABORATORY DATA: Office Visit on 01/20/2014  Component Date Value Ref Range Status  . WBC 01/20/2014 5.4  4.0 - 10.5 K/uL Final  . RBC 01/20/2014 4.67  4.22 - 5.81 MIL/uL Final  . Hemoglobin 01/20/2014 11.9* 13.0 - 17.0 g/dL Final  . HCT 01/20/2014 36.6* 39.0 - 52.0 % Final  . MCV 01/20/2014 78.4  78.0 - 100.0 fL Final  . MCH 01/20/2014 25.5* 26.0 -  34.0 pg Final  . MCHC 01/20/2014 32.5  30.0 - 36.0  g/dL Final  . RDW 01/20/2014 22.2* 11.5 - 15.5 % Final  . Platelets 01/20/2014 154  150 - 400 K/uL Final  . Neutrophils Relative % 01/20/2014 70  43 - 77 % Final  . Neutro Abs 01/20/2014 3.8  1.7 - 7.7 K/uL Final  . Lymphocytes Relative 01/20/2014 18  12 - 46 % Final  . Lymphs Abs 01/20/2014 1.0  0.7 - 4.0 K/uL Final  . Monocytes Relative 01/20/2014 12  3 - 12 % Final  . Monocytes Absolute 01/20/2014 0.7  0.1 - 1.0 K/uL Final  . Eosinophils Relative 01/20/2014 0  0 - 5 % Final  . Eosinophils Absolute 01/20/2014 0.0  0.0 - 0.7 K/uL Final  . Basophils Relative 01/20/2014 0  0 - 1 % Final  . Basophils Absolute 01/20/2014 0.0  0.0 - 0.1 K/uL Final  Hospital Outpatient Visit on 01/12/2014  Component Date Value Ref Range Status  . DLCO unc 01/12/2014 24.47   Preliminary  . DLCO unc % pred 01/12/2014 78   Preliminary  . DL/VA 01/12/2014 5.14   Preliminary  . DL/VA % pred 01/12/2014 110   Preliminary  Infusion on 01/12/2014  Component Date Value Ref Range Status  . WBC 01/12/2014 6.9  4.0 - 10.5 K/uL Final  . RBC 01/12/2014 4.79  4.22 - 5.81 MIL/uL Final  . Hemoglobin 01/12/2014 12.2* 13.0 - 17.0 g/dL Final  . HCT 01/12/2014 36.9* 39.0 - 52.0 % Final  . MCV 01/12/2014 77.0* 78.0 - 100.0 fL Final  . MCH 01/12/2014 25.5* 26.0 - 34.0 pg Final  . MCHC 01/12/2014 33.1  30.0 - 36.0 g/dL Final  . RDW 01/12/2014 22.1* 11.5 - 15.5 % Final  . Platelets 01/12/2014 415* 150 - 400 K/uL Final  . Neutrophils Relative % 01/12/2014 79* 43 - 77 % Final  . Neutro Abs 01/12/2014 5.5  1.7 - 7.7 K/uL Final  . Lymphocytes Relative 01/12/2014 18  12 - 46 % Final  . Lymphs Abs 01/12/2014 1.2  0.7 - 4.0 K/uL Final  . Monocytes Relative 01/12/2014 2* 3 - 12 % Final  . Monocytes Absolute 01/12/2014 0.1  0.1 - 1.0 K/uL Final  . Eosinophils Relative 01/12/2014 1  0 - 5 % Final  . Eosinophils Absolute 01/12/2014 0.1  0.0 - 0.7 K/uL Final  . Basophils Relative 01/12/2014 0  0 - 1 % Final  . Basophils Absolute  01/12/2014 0.0  0.0 - 0.1 K/uL Final  Infusion on 01/05/2014  Component Date Value Ref Range Status  . WBC 01/05/2014 3.0* 4.0 - 10.5 K/uL Final  . RBC 01/05/2014 4.84  4.22 - 5.81 MIL/uL Final  . Hemoglobin 01/05/2014 12.6* 13.0 - 17.0 g/dL Final  . HCT 01/05/2014 37.4* 39.0 - 52.0 % Final  . MCV 01/05/2014 77.3* 78.0 - 100.0 fL Final  . MCH 01/05/2014 26.0  26.0 - 34.0 pg Final  . MCHC 01/05/2014 33.7  30.0 - 36.0 g/dL Final  . RDW 01/05/2014 22.1* 11.5 - 15.5 % Final  . Platelets 01/05/2014 327  150 - 400 K/uL Final  . Neutrophils Relative % 01/05/2014 24* 43 - 77 % Final  . Neutro Abs 01/05/2014 0.7* 1.7 - 7.7 K/uL Final  . Lymphocytes Relative 01/05/2014 41  12 - 46 % Final  . Lymphs Abs 01/05/2014 1.3  0.7 - 4.0 K/uL Final  . Monocytes Relative 01/05/2014 33* 3 - 12 % Final  . Monocytes Absolute 01/05/2014  1.0  0.1 - 1.0 K/uL Final  . Eosinophils Relative 01/05/2014 1  0 - 5 % Final  . Eosinophils Absolute 01/05/2014 0.0  0.0 - 0.7 K/uL Final  . Basophils Relative 01/05/2014 1  0 - 1 % Final  . Basophils Absolute 01/05/2014 0.0  0.0 - 0.1 K/uL Final  . Sodium 01/05/2014 143  137 - 147 mEq/L Final  . Potassium 01/05/2014 4.3  3.7 - 5.3 mEq/L Final  . Chloride 01/05/2014 105  96 - 112 mEq/L Final  . CO2 01/05/2014 26  19 - 32 mEq/L Final  . Glucose, Bld 01/05/2014 96  70 - 99 mg/dL Final  . BUN 01/05/2014 19  6 - 23 mg/dL Final  . Creatinine, Ser 01/05/2014 0.92  0.50 - 1.35 mg/dL Final  . Calcium 01/05/2014 9.7  8.4 - 10.5 mg/dL Final  . Total Protein 01/05/2014 7.9  6.0 - 8.3 g/dL Final  . Albumin 01/05/2014 4.0  3.5 - 5.2 g/dL Final  . AST 01/05/2014 25  0 - 37 U/L Final  . ALT 01/05/2014 28  0 - 53 U/L Final  . Alkaline Phosphatase 01/05/2014 81  39 - 117 U/L Final  . Total Bilirubin 01/05/2014 <0.2* 0.3 - 1.2 mg/dL Final  . GFR calc non Af Amer 01/05/2014 >90  >90 mL/min Final  . GFR calc Af Amer 01/05/2014 >90  >90 mL/min Final   Comment: (NOTE)                           The eGFR has been calculated using the CKD EPI equation.                          This calculation has not been validated in all clinical situations.                          eGFR's persistently <90 mL/min signify possible Chronic Kidney                          Disease.  . AFP-Tumor Marker 01/05/2014 7.4  0.0 - 8.0 ng/mL Final   Comment: (NOTE)                          The [employer] Centaur AFP immunoassay method is used.  Results obtained                          with different assay methods or kits cannot be used interchangeably.                          AFP is a valuable aid in the management of nonseminomatous testicular                          cancer patients when used in conjunction with information available                          from the clinical evaluation and other diagnostic procedures.                          Increased serum AFP concentrations have also been observed in ataxia  telangiectasia, hereditary tyrosinemia, primary hepatocellular                          carcinoma, teratocarcinoma, gastrointestinal tract cancers with and                          without liver metastases, and in benign hepatic conditions such as                          acute viral hepatitis, chronic active hepatitis, and cirrhosis.  This                          result cannot be interpreted as absolute evidence of the presence or                          absence of malignant disease.  This result is not interpretable in                          pregnant females.                          Performed at [facility]  . Beta hCG, Tumor Marker 01/05/2014 14.8* <5.0 mIU/mL Final   Comment: (NOTE)                             Reference Range:                               Males: <5 mIU/mL                               Females: Nonpregnant: <5 mIU/mL                           Values from different assay methods may vary. The use of this                           assay to  monitor or to diagnose patients with cancer or any                           condition other than pregnancy has not been approved by the FDA                           of the manufacturer of the assay.                           This HCG assay is performed on the [employer]                           platform. Use of an alternative HCG assay provides corroboration                           potentially useful in distinguising true versus false elevations  of HCG. To further rule out false elevations of HCG, also                           consider measurement of HCG after pretreatment for human                           anti-animal [mouse] antibody (HCG, Total, with HAMA treatment),                           order code 19720.                          Performed at [facility] Outpatient Visit on 01/01/2014  Component Date Value Ref Range Status  . DLCO unc 01/01/2014 24.35   Preliminary  . DLCO unc % pred 01/01/2014 78   Preliminary  . DLCO cor 01/01/2014 24.35   Preliminary  . DLCO cor % pred 01/01/2014 78   Preliminary  . DL/VA 01/01/2014 5.02   Preliminary  . DL/VA % pred 01/01/2014 107   Preliminary  Infusion on 12/30/2013  Component Date Value Ref Range Status  . WBC 12/30/2013 2.7* 4.0 - 10.5 K/uL Final  . RBC 12/30/2013 4.48  4.22 - 5.81 MIL/uL Final  . Hemoglobin 12/30/2013 11.3* 13.0 - 17.0 g/dL Final  . HCT 12/30/2013 34.5* 39.0 - 52.0 % Final  . MCV 12/30/2013 77.0* 78.0 - 100.0 fL Final  . MCH 12/30/2013 25.2* 26.0 - 34.0 pg Final  . MCHC 12/30/2013 32.8  30.0 - 36.0 g/dL Final  . RDW 12/30/2013 21.6* 11.5 - 15.5 % Final  . Platelets 12/30/2013 162  150 - 400 K/uL Final  . Neutrophils Relative % 12/30/2013 54  43 - 77 % Final  . Neutro Abs 12/30/2013 1.5* 1.7 - 7.7 K/uL Final  . Lymphocytes Relative 12/30/2013 26  12 - 46 % Final  . Lymphs Abs 12/30/2013 0.7  0.7 - 4.0 K/uL Final  . Monocytes Relative  12/30/2013 18* 3 - 12 % Final  . Monocytes Absolute 12/30/2013 0.5  0.1 - 1.0 K/uL Final  . Eosinophils Relative 12/30/2013 0  0 - 5 % Final  . Eosinophils Absolute 12/30/2013 0.0  0.0 - 0.7 K/uL Final  . Basophils Relative 12/30/2013 1  0 - 1 % Final  . Basophils Absolute 12/30/2013 0.0  0.0 - 0.1 K/uL Final  Infusion on 12/23/2013  Component Date Value Ref Range Status  . WBC 12/23/2013 7.3  4.0 - 10.5 K/uL Final  . RBC 12/23/2013 4.55  4.22 - 5.81 MIL/uL Final  . Hemoglobin 12/23/2013 11.3* 13.0 - 17.0 g/dL Final  . HCT 12/23/2013 34.2* 39.0 - 52.0 % Final  . MCV 12/23/2013 75.2* 78.0 - 100.0 fL Final  . MCH 12/23/2013 24.8* 26.0 - 34.0 pg Final  . MCHC 12/23/2013 33.0  30.0 - 36.0 g/dL Final  . RDW 12/23/2013 20.0* 11.5 - 15.5 % Final  . Platelets 12/23/2013 353  150 - 400 K/uL Final  . Neutrophils Relative % 12/23/2013 87* 43 - 77 % Final  . Neutro Abs 12/23/2013 6.3  1.7 - 7.7 K/uL Final  . Lymphocytes Relative 12/23/2013 11* 12 - 46 % Final  . Lymphs Abs 12/23/2013 0.8  0.7 - 4.0 K/uL Final  . Monocytes Relative 12/23/2013 2* 3 - 12 % Final  . Monocytes Absolute  12/23/2013 0.1  0.1 - 1.0 K/uL Final  . Eosinophils Relative 12/23/2013 0  0 - 5 % Final  . Eosinophils Absolute 12/23/2013 0.0  0.0 - 0.7 K/uL Final  . Basophils Relative 12/23/2013 0  0 - 1 % Final  . Basophils Absolute 12/23/2013 0.0  0.0 - 0.1 K/uL Final    PATHOLOGY: Nonseminomatous tumor  Urinalysis    Component Value Date/Time   COLORURINE YELLOW 07/05/2013 2240   APPEARANCEUR CLEAR 07/05/2013 2240   LABSPEC 1.025 07/05/2013 2240   PHURINE 8.5* 07/05/2013 [place] 07/05/2013 2240   HGBUR NEGATIVE 07/05/2013 [place] 07/05/2013 [place] 07/05/2013 2240   PROTEINUR NEGATIVE 07/05/2013 2240   UROBILINOGEN 0.2 07/05/2013 2240   NITRITE NEGATIVE 07/05/2013 2240   LEUKOCYTESUR NEGATIVE 07/05/2013 2240    RADIOGRAPHIC STUDIES: No results found.  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  2. Perennial rhinitis, symptomatic.  3. Mild mental retardation.  4. Worsening DLCO, no longer a bleomycin.   PLAN:  #1. Cycle 4 chemotherapy omitting bleomycin beginning on 01/26/2014. Patient will receive etoposide plus cisplatin daily for 5 days. #2. Flonase inhaler 2 puffs in each nostril at bedtime. #3. Repeat CT scans of chest abdomen and pelvis on 02/13/2014. #4. Office visit on 02/16/2014 with CBC, chem profile, beta hCG, alpha-fetoprotein.   All questions were answered. The patient knows to call the clinic with any problems, questions or concerns. We can certainly see the patient much sooner if necessary.   I spent 25 minutes counseling the patient face to face. The total time spent in the appointment was 30 minutes.    Doroteo Bradford, MD 01/20/2014 11:18 AM

## 2014-01-20 NOTE — Patient Instructions (Signed)
[facility] Discharge Instructions  RECOMMENDATIONS MADE BY THE CONSULTANT AND ANY TEST RESULTS WILL BE SENT TO YOUR REFERRING PHYSICIAN.  We will see you on Monday.  Chest ct/ab ct on 3/20  At 10:15, be here at 10:00am.    Thank you for choosing [facility] to provide your oncology and hematology care.  To afford each patient quality time with our providers, please arrive at least 15 minutes before your scheduled appointment time.  With your help, our goal is to use those 15 minutes to complete the necessary work-up to ensure our physicians have the information they need to help with your evaluation and healthcare recommendations.    Effective January 1st, 2014, we ask that you re-schedule your appointment with our physicians should you arrive 10 or more minutes late for your appointment.  We strive to give you quality time with our providers, and arriving late affects you and other patients whose appointments are after yours.    Again, thank you for choosing [facility].  Our hope is that these requests will decrease the amount of time that you wait before being seen by our physicians.       _____________________________________________________________  Should you have questions after your visit to [facility], please contact our office at [phone] between the hours of 8:30 a.m. and 5:00 p.m.  Voicemails left after 4:30 p.m. will not be returned until the following business day.  For prescription refill requests, have your pharmacy contact our office with your prescription refill request.

## 2014-01-26 ENCOUNTER — Encounter (HOSPITAL_COMMUNITY): Payer: Self-pay

## 2014-01-26 ENCOUNTER — Encounter (HOSPITAL_BASED_OUTPATIENT_CLINIC_OR_DEPARTMENT_OTHER): Payer: Medicaid Other

## 2014-01-26 ENCOUNTER — Encounter (HOSPITAL_BASED_OUTPATIENT_CLINIC_OR_DEPARTMENT_OTHER): Payer: Self-pay

## 2014-01-26 VITALS — BP 118/79 | HR 82 | Temp 97.4°F | Resp 20

## 2014-01-26 VITALS — BP 127/81 | HR 99 | Temp 97.2°F | Resp 20 | Wt 197.6 lb

## 2014-01-26 DIAGNOSIS — C781 Secondary malignant neoplasm of mediastinum: Secondary | ICD-10-CM

## 2014-01-26 DIAGNOSIS — F79 Unspecified intellectual disabilities: Secondary | ICD-10-CM

## 2014-01-26 DIAGNOSIS — C7800 Secondary malignant neoplasm of unspecified lung: Secondary | ICD-10-CM

## 2014-01-26 DIAGNOSIS — D234 Other benign neoplasm of skin of scalp and neck: Secondary | ICD-10-CM

## 2014-01-26 DIAGNOSIS — C6290 Malignant neoplasm of unspecified testis, unspecified whether descended or undescended: Secondary | ICD-10-CM

## 2014-01-26 DIAGNOSIS — C6292 Malignant neoplasm of left testis, unspecified whether descended or undescended: Secondary | ICD-10-CM

## 2014-01-26 DIAGNOSIS — Z5111 Encounter for antineoplastic chemotherapy: Secondary | ICD-10-CM

## 2014-01-26 LAB — CBC WITH DIFFERENTIAL/PLATELET
Basophils Absolute: 0 10*3/uL (ref 0.0–0.1)
Basophils Relative: 0 % (ref 0–1)
EOS PCT: 1 % (ref 0–5)
Eosinophils Absolute: 0 10*3/uL (ref 0.0–0.7)
HEMATOCRIT: 34.3 % — AB (ref 39.0–52.0)
Hemoglobin: 11.7 g/dL — ABNORMAL LOW (ref 13.0–17.0)
LYMPHS ABS: 1.1 10*3/uL (ref 0.7–4.0)
Lymphocytes Relative: 35 % (ref 12–46)
MCH: 27.1 pg (ref 26.0–34.0)
MCHC: 34.1 g/dL (ref 30.0–36.0)
MCV: 79.4 fL (ref 78.0–100.0)
MONO ABS: 0.6 10*3/uL (ref 0.1–1.0)
MONOS PCT: 20 % — AB (ref 3–12)
Neutro Abs: 1.3 10*3/uL — ABNORMAL LOW (ref 1.7–7.7)
Neutrophils Relative %: 44 % (ref 43–77)
Platelets: 210 10*3/uL (ref 150–400)
RBC: 4.32 MIL/uL (ref 4.22–5.81)
RDW: 22.8 % — ABNORMAL HIGH (ref 11.5–15.5)
WBC: 3 10*3/uL — ABNORMAL LOW (ref 4.0–10.5)

## 2014-01-26 LAB — COMPREHENSIVE METABOLIC PANEL
ALT: 26 U/L (ref 0–53)
AST: 19 U/L (ref 0–37)
Albumin: 3.9 g/dL (ref 3.5–5.2)
Alkaline Phosphatase: 74 U/L (ref 39–117)
BUN: 15 mg/dL (ref 6–23)
CALCIUM: 9.4 mg/dL (ref 8.4–10.5)
CO2: 25 meq/L (ref 19–32)
CREATININE: 0.81 mg/dL (ref 0.50–1.35)
Chloride: 103 mEq/L (ref 96–112)
GFR calc Af Amer: >90 mL/min (ref >90)
Glucose, Bld: 77 mg/dL (ref 70–99)
Potassium: 4.2 mEq/L (ref 3.7–5.3)
Sodium: 140 mEq/L (ref 137–147)
Total Bilirubin: <0.2 mg/dL — ABNORMAL LOW (ref 0.3–1.2)
Total Protein: 7.3 g/dL (ref 6.0–8.3)

## 2014-01-26 MED ORDER — SODIUM CHLORIDE 0.9 % IJ SOLN
10.0000 mL | INTRAMUSCULAR | Status: DC | PRN
  Administered 2014-01-26: 10 mL

## 2014-01-26 MED ORDER — SODIUM CHLORIDE 0.9 % IV SOLN
100.0000 mg/m2 | Freq: Once | INTRAVENOUS | Status: AC
  Administered 2014-01-26: 210 mg via INTRAVENOUS
  Filled 2014-01-26: qty 10.5

## 2014-01-26 MED ORDER — FOSAPREPITANT DIMEGLUMINE INJECTION 150 MG
Freq: Once | INTRAVENOUS | Status: AC
  Administered 2014-01-26: 11:00:00 via INTRAVENOUS
  Filled 2014-01-26: qty 5

## 2014-01-26 MED ORDER — HEPARIN SOD (PORK) LOCK FLUSH 100 UNIT/ML IV SOLN
500.0000 [IU] | Freq: Once | INTRAVENOUS | Status: AC | PRN
  Administered 2014-01-26: 500 [IU]

## 2014-01-26 MED ORDER — PALONOSETRON HCL INJECTION 0.25 MG/5ML
0.2500 mg | Freq: Once | INTRAVENOUS | Status: AC
  Administered 2014-01-26: 0.25 mg via INTRAVENOUS

## 2014-01-26 MED ORDER — HEPARIN SOD (PORK) LOCK FLUSH 100 UNIT/ML IV SOLN
INTRAVENOUS | Status: AC
  Filled 2014-01-26: qty 5

## 2014-01-26 MED ORDER — SODIUM CHLORIDE 0.9 % IV SOLN: 150.0000 mg | Freq: Once | INTRAVENOUS | Status: DC

## 2014-01-26 MED ORDER — PALONOSETRON HCL INJECTION 0.25 MG/5ML
INTRAVENOUS | Status: AC
  Filled 2014-01-26: qty 5

## 2014-01-26 MED ORDER — SODIUM CHLORIDE 0.9 % IV SOLN
20.0000 mg/m2 | Freq: Once | INTRAVENOUS | Status: AC
  Administered 2014-01-26: 42 mg via INTRAVENOUS
  Filled 2014-01-26: qty 42

## 2014-01-26 MED ORDER — POTASSIUM CHLORIDE 2 MEQ/ML IV SOLN
Freq: Once | INTRAVENOUS | Status: AC
  Administered 2014-01-26: 12:00:00 via INTRAVENOUS
  Filled 2014-01-26: qty 10

## 2014-01-26 MED ORDER — SODIUM CHLORIDE 0.9 % IV SOLN
Freq: Once | INTRAVENOUS | Status: AC
  Administered 2014-01-26: 10:00:00 via INTRAVENOUS

## 2014-01-26 MED ORDER — DEXAMETHASONE SODIUM PHOSPHATE 20 MG/5ML IJ SOLN: 12.0000 mg | Freq: Once | INTRAMUSCULAR | Status: DC

## 2014-01-26 NOTE — Progress Notes (Signed)
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 Decrease in DLCO at first due to mediastinal lymph node involvement and most recently from probable bleomycin toxicity.  INTERVAL HISTORY: Devon Bailey 23 y.o. male returns for for initiation of cycle 4 of chemotherapy for stage IV nonseminomatous testicular cancer with plans to repeat CT scans on 02/13/2014.  His mom is concerned about a small pigmented area on his scalp. Patient offers no new complaints. He denies any increasing shortness of breath, PND, orthopnea, palpitations, lower extremity swelling or redness, diarrhea, constipation, peripheral paresthesias, skin rash, headache, or seizures.  MEDICAL HISTORY: Past Medical History  Diagnosis Date  . Anxiety   . Seasonal allergies   . Mentally challenged     mother states mentally "slow"  . 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   Left radical orchiectomy 08/07/2013 with first visit to [facility] on 11/04/2013. Workup at that time revealed extensive metastatic disease involving the mediastinum as well as the lung itself. DLCO was initially low presumably due to mediastinal involvement but later continue to remain low while on bleomycin so cycle 3 of BEP omitted bleomycin as did cycle 4, the lateral which was started on 01/26/2014.   ALLERGIES:  is allergic to penicillins.  MEDICATIONS: has a  current medication list which includes the following prescription(s): acetaminophen, acetaminophen, cisplatin, etoposide, fluticasone, hydrocodone-acetaminophen, lidocaine-prilocaine, metoclopramide, and prochlorperazine, and the following Facility-Administered Medications: lorazepam and sodium chloride.  SURGICAL HISTORY:  Past Surgical History  Procedure Laterality Date  . Orchiectomy Left 08/07/2013    Procedure: RADICAL ORCHIECTOMY;  Surgeon: Marissa Nestle, MD;  Location: [facility];  Service: Urology;  Laterality: Left;  . Radical orchiectomy Left 08/07/2013  . Portacath placement Right     FAMILY HISTORY: family history is not on file.  SOCIAL HISTORY:  reports that he has never smoked. He has never used smokeless tobacco. He reports that he does not drink alcohol or use illicit drugs.  REVIEW OF SYSTEMS:  Other than that discussed above is noncontributory.  PHYSICAL EXAMINATION: ECOG PERFORMANCE STATUS: 0 - Asymptomatic  There were no vitals taken for this visit.  GENERAL:alert, no distress and comfortable. Alopecia the scalp. Midline skin tag on the vortex of the scalp without hemorrhage or black pigmentation. Measured 3 mm in diameter. SKIN: skin color, texture, turgor are normal, no rashes or significant lesions EYES: PERLA; Conjunctiva are pink and non-injected, sclera clear OROPHARYNX:no exudate, no erythema on lips, buccal mucosa, or tongue. NECK: supple, thyroid normal size, non-tender, without nodularity. No masses CHEST: Normal AP diameter with no breast masses. No gynecomastia. LYMPH:  no palpable lymphadenopathy in the cervical, axillary or inguinal LUNGS: clear to auscultation and percussion with normal breathing effort HEART: regular rate & rhythm and no murmurs. ABDOMEN:abdomen soft, non-tender and normal bowel sounds MUSCULOSKELETAL:no cyanosis of digits and no clubbing. Range of motion normal.  NEURO: alert & oriented x 3 with fluent speech, no  focal  motor/sensory deficits GENITALIA: Absent left testis with no mass on the right side. No inguinal adenopathy.   LABORATORY DATA: Office Visit on 01/20/2014  Component Date Value Ref Range Status  . WBC 01/20/2014 5.4  4.0 - 10.5 K/uL Final  . RBC 01/20/2014 4.67  4.22 - 5.81 MIL/uL Final  . Hemoglobin 01/20/2014 11.9* 13.0 - 17.0 g/dL Final  . HCT 01/20/2014 36.6* 39.0 - 52.0 % Final  . MCV 01/20/2014 78.4  78.0 - 100.0 fL Final  . MCH 01/20/2014 25.5* 26.0 - 34.0 pg Final  . MCHC 01/20/2014 32.5  30.0 - 36.0 g/dL Final  . RDW 01/20/2014 22.2* 11.5 - 15.5 % Final  . Platelets 01/20/2014 154  150 - 400 K/uL Final  . Neutrophils Relative % 01/20/2014 70  43 - 77 % Final  . Neutro Abs 01/20/2014 3.8  1.7 - 7.7 K/uL Final  . Lymphocytes Relative 01/20/2014 18  12 - 46 % Final  . Lymphs Abs 01/20/2014 1.0  0.7 - 4.0 K/uL Final  . Monocytes Relative 01/20/2014 12  3 - 12 % Final  . Monocytes Absolute 01/20/2014 0.7  0.1 - 1.0 K/uL Final  . Eosinophils Relative 01/20/2014 0  0 - 5 % Final  . Eosinophils Absolute 01/20/2014 0.0  0.0 - 0.7 K/uL Final  . Basophils Relative 01/20/2014 0  0 - 1 % Final  . Basophils Absolute 01/20/2014 0.0  0.0 - 0.1 K/uL Final  Hospital Outpatient Visit on 01/12/2014  Component Date Value Ref Range Status  . DLCO unc 01/12/2014 24.47   Preliminary  . DLCO unc % pred 01/12/2014 78   Preliminary  . DL/VA 01/12/2014 5.14   Preliminary  . DL/VA % pred 01/12/2014 110   Preliminary  Infusion on 01/12/2014  Component Date Value Ref Range Status  . WBC 01/12/2014 6.9  4.0 - 10.5 K/uL Final  . RBC 01/12/2014 4.79  4.22 - 5.81 MIL/uL Final  . Hemoglobin 01/12/2014 12.2* 13.0 - 17.0 g/dL Final  . HCT 01/12/2014 36.9* 39.0 - 52.0 % Final  . MCV 01/12/2014 77.0* 78.0 - 100.0 fL Final  . MCH 01/12/2014 25.5* 26.0 - 34.0 pg Final  . MCHC 01/12/2014 33.1  30.0 - 36.0 g/dL Final  . RDW 01/12/2014 22.1* 11.5 - 15.5 % Final  . Platelets 01/12/2014 415* 150 - 400 K/uL Final   . Neutrophils Relative % 01/12/2014 79* 43 - 77 % Final  . Neutro Abs 01/12/2014 5.5  1.7 - 7.7 K/uL Final  . Lymphocytes Relative 01/12/2014 18  12 - 46 % Final  . Lymphs Abs 01/12/2014 1.2  0.7 - 4.0 K/uL Final  . Monocytes Relative 01/12/2014 2* 3 - 12 % Final  . Monocytes Absolute 01/12/2014 0.1  0.1 - 1.0 K/uL Final  . Eosinophils Relative 01/12/2014 1  0 - 5 % Final  . Eosinophils Absolute 01/12/2014 0.1  0.0 - 0.7 K/uL Final  . Basophils Relative 01/12/2014 0  0 - 1 % Final  . Basophils Absolute 01/12/2014 0.0  0.0 - 0.1 K/uL Final  Infusion on 01/05/2014  Component Date Value Ref Range Status  . WBC 01/05/2014 3.0* 4.0 - 10.5 K/uL Final  . RBC 01/05/2014 4.84  4.22 - 5.81 MIL/uL Final  . Hemoglobin 01/05/2014 12.6* 13.0 - 17.0 g/dL Final  . HCT 01/05/2014 37.4* 39.0 - 52.0 % Final  . MCV 01/05/2014 77.3* 78.0 - 100.0 fL Final  . MCH 01/05/2014 26.0  26.0 - 34.0 pg Final  . MCHC  01/05/2014 33.7  30.0 - 36.0 g/dL Final  . RDW 01/05/2014 22.1* 11.5 - 15.5 % Final  . Platelets 01/05/2014 327  150 - 400 K/uL Final  . Neutrophils Relative % 01/05/2014 24* 43 - 77 % Final  . Neutro Abs 01/05/2014 0.7* 1.7 - 7.7 K/uL Final  . Lymphocytes Relative 01/05/2014 41  12 - 46 % Final  . Lymphs Abs 01/05/2014 1.3  0.7 - 4.0 K/uL Final  . Monocytes Relative 01/05/2014 33* 3 - 12 % Final  . Monocytes Absolute 01/05/2014 1.0  0.1 - 1.0 K/uL Final  . Eosinophils Relative 01/05/2014 1  0 - 5 % Final  . Eosinophils Absolute 01/05/2014 0.0  0.0 - 0.7 K/uL Final  . Basophils Relative 01/05/2014 1  0 - 1 % Final  . Basophils Absolute 01/05/2014 0.0  0.0 - 0.1 K/uL Final  . Sodium 01/05/2014 143  137 - 147 mEq/L Final  . Potassium 01/05/2014 4.3  3.7 - 5.3 mEq/L Final  . Chloride 01/05/2014 105  96 - 112 mEq/L Final  . CO2 01/05/2014 26  19 - 32 mEq/L Final  . Glucose, Bld 01/05/2014 96  70 - 99 mg/dL Final  . BUN 01/05/2014 19  6 - 23 mg/dL Final  . Creatinine, Ser 01/05/2014 0.92  0.50 - 1.35  mg/dL Final  . Calcium 01/05/2014 9.7  8.4 - 10.5 mg/dL Final  . Total Protein 01/05/2014 7.9  6.0 - 8.3 g/dL Final  . Albumin 01/05/2014 4.0  3.5 - 5.2 g/dL Final  . AST 01/05/2014 25  0 - 37 U/L Final  . ALT 01/05/2014 28  0 - 53 U/L Final  . Alkaline Phosphatase 01/05/2014 81  39 - 117 U/L Final  . Total Bilirubin 01/05/2014 <0.2* 0.3 - 1.2 mg/dL Final  . GFR calc non Af Amer 01/05/2014 >90  >90 mL/min Final  . GFR calc Af Amer 01/05/2014 >90  >90 mL/min Final   Comment: (NOTE)                          The eGFR has been calculated using the CKD EPI equation.                          This calculation has not been validated in all clinical situations.                          eGFR's persistently <90 mL/min signify possible Chronic Kidney                          Disease.  . AFP-Tumor Marker 01/05/2014 7.4  0.0 - 8.0 ng/mL Final   Comment: (NOTE)                          The [employer] Centaur AFP immunoassay method is used.  Results obtained                          with different assay methods or kits cannot be used interchangeably.                          AFP is a valuable aid in the management of nonseminomatous testicular  cancer patients when used in conjunction with information available                          from the clinical evaluation and other diagnostic procedures.                          Increased serum AFP concentrations have also been observed in ataxia                          telangiectasia, hereditary tyrosinemia, primary hepatocellular                          carcinoma, teratocarcinoma, gastrointestinal tract cancers with and                          without liver metastases, and in benign hepatic conditions such as                          acute viral hepatitis, chronic active hepatitis, and cirrhosis.  This                          result cannot be interpreted as absolute evidence of the presence or                          absence of  malignant disease.  This result is not interpretable in                          pregnant females.                          Performed at [facility]  . Beta hCG, Tumor Marker 01/05/2014 14.8* <5.0 mIU/mL Final   Comment: (NOTE)                             Reference Range:                               Males: <5 mIU/mL                               Females: Nonpregnant: <5 mIU/mL                           Values from different assay methods may vary. The use of this                           assay to monitor or to diagnose patients with cancer or any                           condition other than pregnancy has not been approved by the FDA                           of the manufacturer of the assay.  This HCG assay is performed on the [employer]                           platform. Use of an alternative HCG assay provides corroboration                           potentially useful in distinguising true versus false elevations                           of HCG. To further rule out false elevations of HCG, also                           consider measurement of HCG after pretreatment for human                           anti-animal [mouse] antibody (HCG, Total, with HAMA treatment),                           order code 19720.                          Performed at [facility] Outpatient Visit on 01/01/2014  Component Date Value Ref Range Status  . DLCO unc 01/01/2014 24.35   Preliminary  . DLCO unc % pred 01/01/2014 78   Preliminary  . DLCO cor 01/01/2014 24.35   Preliminary  . DLCO cor % pred 01/01/2014 78   Preliminary  . DL/VA 01/01/2014 5.02   Preliminary  . DL/VA % pred 01/01/2014 107   Preliminary  Infusion on 12/30/2013  Component Date Value Ref Range Status  . WBC 12/30/2013 2.7* 4.0 - 10.5 K/uL Final  . RBC 12/30/2013 4.48  4.22 - 5.81 MIL/uL Final  . Hemoglobin 12/30/2013 11.3* 13.0 - 17.0 g/dL Final  . HCT  12/30/2013 34.5* 39.0 - 52.0 % Final  . MCV 12/30/2013 77.0* 78.0 - 100.0 fL Final  . MCH 12/30/2013 25.2* 26.0 - 34.0 pg Final  . MCHC 12/30/2013 32.8  30.0 - 36.0 g/dL Final  . RDW 12/30/2013 21.6* 11.5 - 15.5 % Final  . Platelets 12/30/2013 162  150 - 400 K/uL Final  . Neutrophils Relative % 12/30/2013 54  43 - 77 % Final  . Neutro Abs 12/30/2013 1.5* 1.7 - 7.7 K/uL Final  . Lymphocytes Relative 12/30/2013 26  12 - 46 % Final  . Lymphs Abs 12/30/2013 0.7  0.7 - 4.0 K/uL Final  . Monocytes Relative 12/30/2013 18* 3 - 12 % Final  . Monocytes Absolute 12/30/2013 0.5  0.1 - 1.0 K/uL Final  . Eosinophils Relative 12/30/2013 0  0 - 5 % Final  . Eosinophils Absolute 12/30/2013 0.0  0.0 - 0.7 K/uL Final  . Basophils Relative 12/30/2013 1  0 - 1 % Final  . Basophils Absolute 12/30/2013 0.0  0.0 - 0.1 K/uL Final    PATHOLOGY: FINAL for MAYNARD, DAVID ([id number]) Patient: KANNEN, MOXEY Collected: 08/07/2013 Client: [facility] Accession: [id number] Received: 08/07/2013 Ricki Miller DOB: Apr 05, 1991 Age: 41 Gender: M Reported: 08/12/2013 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 PLEASE SEE ONCOLOGY TEMPLATE FOR DETAILS. Microscopic Comment ONCOLOGY TABLE - TESTIS 1. Specimen and laterality: Left testis. 2. Tumor focality: Unifocal. 3. Macroscopic extent of tumor: Confined within testis. 4. Maximum tumor size (cm): 14 cm. 5. 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 6. Microscopic tumor extension: Limited to testis. 7. Spermatic cord and surgical margins: Negative. 8. Lymph-Vascular invasion: Present. 9. Intratubular germ cell neoplasia: N/A. 10. Lymph nodes: # examined: N/A; # positive:  N/A 11. TNM code: pT2, pNX 12. Serum tumor markers: See patient's medical record 13. 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 Focal angiolymphatic invasion is present. Confirmatory immunostains were performed for CD30, beta hCG, AFP, CD117, PLAP, and Pan-Cytokeratin AE1/AE3 were performed and the immunostains highlight the different components of the tumor. Control stained appropriately. Dr Saralyn Pilar agrees. (HCL:ecj 08/12/2013) Aldona Bar MD Pathologist, Electronic Signature (Case signed 08/12/2013) Specimen Gross and Clinical Information Specimen(s) Obtained: Testis, tumor, left 1 of 2 FINAL for EINO, WHITNER [phone]) Specimen Clinical Information left testicular tumor Gross Received in formalin is a 784-gram, 14.4 x 10.3 x 8.5 cm testicle, which includes a 10 cm segment of spermatic cord. The tunica vaginalis is translucent tan-pink. The tunica albuginea is smooth tan-white. The cut surface of the testicle shows complete replacement by a firm, tan-white to pale yellow tumor. There are small cystic spaces present measuring up to 0.7 cm. The cut surface of the epididymis is unremarkable. Sections are submitted in six cassettes: A = spermatic cord margin. B - F = tumor. (GRP:ecj 08/08/2013) Stain(s) used in Diagnosis: The following stain(s) were used in diagnosing the case: Alpha-Feto Protein, CD117 (C-KIT), CK AE1AE3, Beta Human Chorionic Gonadotrophin, Placental Alkaline Phosphatase, CD 30. The control(s) stained appropriately. Disclaimer Some of these immunohistochemical stains may have been developed and the performance characteristics determined by [facility]. Some may not have been cleared or approved by the U.S. Food and Drug Administration. The FDA has determined that such clearance or approval is not necessary. This test is used for clinical  purposes. It should not be regarded as investigational or for research. This laboratory is certified under the [place] (CLIA-88) as qualified to perform high complexity clinical laboratory testing. Report signed out from the following location(s) Technical Component performed at [place], [place] [address] CLIA: [id number]., Technical Component performed at [place], [place], [place] [address]. CLIA #: [id number], Interpretation performed at [place], [place], [place] [address]. CLIA #: [id number],  Urinalysis    Component Value Date/Time   COLORURINE YELLOW 07/05/2013 2240   APPEARANCEUR CLEAR 07/05/2013 2240   LABSPEC 1.025 07/05/2013 2240   PHURINE 8.5* 07/05/2013 2240   GLUCOSEU NEGATIVE 07/05/2013 2240   HGBUR NEGATIVE 07/05/2013 [place] 07/05/2013 2240   KETONESUR NEGATIVE 07/05/2013 2240   PROTEINUR NEGATIVE 07/05/2013 2240   UROBILINOGEN 0.2 07/05/2013 2240   NITRITE NEGATIVE 07/05/2013 2240   LEUKOCYTESUR NEGATIVE 07/05/2013 2240    RADIOGRAPHIC STUDIES: Baseline 10/30/2013 NM PET Image Initial (PI) Skull Base To Thigh Status: Final result         PACS Images    Show images for NM PET Image Initial (PI) Skull Base To Thigh  Study Result    CLINICAL DATA: Initial treatment strategy for testicular cancer,  diagnosed as mixed germ cell tumor 08/07/2013.  EXAM:  NUCLEAR MEDICINE PET SKULL BASE TO THIGH  FASTING BLOOD GLUCOSE: Value: 22m/dl  TECHNIQUE:  17.6 mCi F-18 FDG was injected intravenously. CT data was obtained  and used for attenuation correction and anatomic localization only.  (This was not acquired as a diagnostic CT examination.) Additional  exam technical data entered on technologist worksheet.  COMPARISON: Testicular ultrasound 07/05/2013. No prior diagnostic  CT for comparison.    FINDINGS:  NECK  No hypermetabolic lymph nodes in the neck.  CHEST  Numerous FDG avid pulmonary nodules are identified, with a dominant  left lower lobe mass measuring at least 9.0 cm in maximal AP  diameter, demonstrating internal FDG uptake, SUV maximum 22.0.  Bulky mediastinal FDG avid lymphadenopathy is identified including  the high left paratracheal region, AP window, pretracheal station,  and subcarinal station. Representative uptake within a dominant 3.5  cm subcarinal node measures SUV maximum 20.5.  Bulky left hilar FDG avid lymphadenopathy is also identified,  essentially contiguous with the dominant left lower lobe pulmonary  mass, representative SUV maximum 22.0.  ABDOMEN/PELVIS  There is an area of focal abnormal FDG avidity within the superior  tip of the spleen, SUV maximum 5.5. Inferior to this, there is a  lobulated hypodense lesion without internal FDG uptake, most likely  a lymphangioma or hemangioma.  Small retroperitoneal lymph nodes are identified without abnormal  internal FDG uptake. Evidence of left orchiectomy.  SKELETON  No focal hypermetabolic activity to suggest skeletal metastasis.  IMPRESSION:  Multi focal abnormal FDG uptake within the thorax as above,  compatible with metastatic disease.  No abnormal FDG uptake within the neck or pelvis.  Focus of abnormal FDG uptake within the spleen which is not further  evaluated at this nondiagnostic CT exam, with an adjacent probable  lymphangioma or hemangioma. Abdominal MRI with contrast is  recommended for further evaluation.  Electronically Signed  By: GConchita ParisM.D.  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  #2. Perennial rhinitis, symptomatic.  #3. Mild mental retardation.  #4. Decreased DLCO, no longer on bleomycin. #5. Small mole on vortex of scalp.    PLAN:  #1. Complete cycle 4 of chemotherapy with cisplatin and etoposide alone. #2. Repeat CT scans on 02/13/2014 with repeat visit on 02/16/2014.   All questions were answered. The patient knows to call the clinic with any problems, questions or concerns. We can certainly see the patient much sooner if necessary.   I spent 25 minutes counseling the patient face to face. The total time spent in the appointment was 30 minutes.    FDoroteo Bradford MD 01/26/2014 9:53 AM

## 2014-01-27 ENCOUNTER — Encounter (HOSPITAL_BASED_OUTPATIENT_CLINIC_OR_DEPARTMENT_OTHER): Payer: Medicaid Other

## 2014-01-27 VITALS — BP 115/72 | HR 112 | Temp 98.3°F | Resp 20 | Wt 198.1 lb

## 2014-01-27 DIAGNOSIS — Z5111 Encounter for antineoplastic chemotherapy: Secondary | ICD-10-CM

## 2014-01-27 DIAGNOSIS — C6290 Malignant neoplasm of unspecified testis, unspecified whether descended or undescended: Secondary | ICD-10-CM

## 2014-01-27 DIAGNOSIS — C787 Secondary malignant neoplasm of liver and intrahepatic bile duct: Secondary | ICD-10-CM

## 2014-01-27 DIAGNOSIS — C781 Secondary malignant neoplasm of mediastinum: Secondary | ICD-10-CM

## 2014-01-27 DIAGNOSIS — F79 Unspecified intellectual disabilities: Secondary | ICD-10-CM

## 2014-01-27 LAB — AFP TUMOR MARKER: AFP-Tumor Marker: 9.2 ng/mL — ABNORMAL HIGH (ref 0.0–8.0)

## 2014-01-27 MED ORDER — SODIUM CHLORIDE 0.9 % IV SOLN
20.0000 mg/m2 | Freq: Once | INTRAVENOUS | Status: AC
  Administered 2014-01-27: 42 mg via INTRAVENOUS
  Filled 2014-01-27: qty 42

## 2014-01-27 MED ORDER — SODIUM CHLORIDE 0.9 % IV SOLN
20.0000 mg | Freq: Once | INTRAVENOUS | Status: AC
  Administered 2014-01-27: 20 mg via INTRAVENOUS
  Filled 2014-01-27: qty 2

## 2014-01-27 MED ORDER — POTASSIUM CHLORIDE 2 MEQ/ML IV SOLN
Freq: Once | INTRAVENOUS | Status: AC
  Administered 2014-01-27: 11:00:00 via INTRAVENOUS
  Filled 2014-01-27: qty 10

## 2014-01-27 MED ORDER — HEPARIN SOD (PORK) LOCK FLUSH 100 UNIT/ML IV SOLN
500.0000 [IU] | Freq: Once | INTRAVENOUS | Status: AC | PRN
  Administered 2014-01-27: 500 [IU]
  Filled 2014-01-27: qty 5

## 2014-01-27 MED ORDER — SODIUM CHLORIDE 0.9 % IV SOLN
Freq: Once | INTRAVENOUS | Status: AC
  Administered 2014-01-27: 10:00:00 via INTRAVENOUS

## 2014-01-27 MED ORDER — DEXAMETHASONE SODIUM PHOSPHATE 10 MG/ML IJ SOLN
20.0000 mg | Freq: Once | INTRAMUSCULAR | Status: DC
Start: 2014-01-27 — End: 2014-01-27

## 2014-01-27 MED ORDER — SODIUM CHLORIDE 0.9 % IJ SOLN: 10.0000 mL | INTRAMUSCULAR | Status: DC | PRN

## 2014-01-27 MED ORDER — SODIUM CHLORIDE 0.9 % IV SOLN
100.0000 mg/m2 | Freq: Once | INTRAVENOUS | Status: AC
  Administered 2014-01-27: 210 mg via INTRAVENOUS
  Filled 2014-01-27: qty 10.5

## 2014-01-27 NOTE — Progress Notes (Signed)
Tolerated infusion well. 

## 2014-01-28 ENCOUNTER — Encounter (HOSPITAL_BASED_OUTPATIENT_CLINIC_OR_DEPARTMENT_OTHER): Payer: Medicaid Other

## 2014-01-28 VITALS — BP 116/74 | HR 88 | Temp 98.1°F | Resp 18

## 2014-01-28 DIAGNOSIS — F79 Unspecified intellectual disabilities: Secondary | ICD-10-CM

## 2014-01-28 DIAGNOSIS — C6290 Malignant neoplasm of unspecified testis, unspecified whether descended or undescended: Secondary | ICD-10-CM

## 2014-01-28 DIAGNOSIS — C781 Secondary malignant neoplasm of mediastinum: Secondary | ICD-10-CM

## 2014-01-28 DIAGNOSIS — Z5111 Encounter for antineoplastic chemotherapy: Secondary | ICD-10-CM

## 2014-01-28 MED ORDER — SODIUM CHLORIDE 0.9 % IV SOLN
20.0000 mg/m2 | Freq: Once | INTRAVENOUS | Status: AC
  Administered 2014-01-28: 42 mg via INTRAVENOUS
  Filled 2014-01-28: qty 42

## 2014-01-28 MED ORDER — SODIUM CHLORIDE 0.9 % IV SOLN
Freq: Once | INTRAVENOUS | Status: AC
  Administered 2014-01-28: 10:00:00 via INTRAVENOUS

## 2014-01-28 MED ORDER — SODIUM CHLORIDE 0.9 % IV SOLN
100.0000 mg/m2 | Freq: Once | INTRAVENOUS | Status: AC
  Administered 2014-01-28: 210 mg via INTRAVENOUS
  Filled 2014-01-28: qty 10.5

## 2014-01-28 MED ORDER — SODIUM CHLORIDE 0.9 % IV SOLN
20.0000 mg | Freq: Once | INTRAVENOUS | Status: AC
  Administered 2014-01-28: 20 mg via INTRAVENOUS
  Filled 2014-01-28: qty 2

## 2014-01-28 MED ORDER — HEPARIN SOD (PORK) LOCK FLUSH 100 UNIT/ML IV SOLN
500.0000 [IU] | Freq: Once | INTRAVENOUS | Status: AC | PRN
  Administered 2014-01-28: 500 [IU]
  Filled 2014-01-28: qty 5

## 2014-01-28 MED ORDER — PALONOSETRON HCL INJECTION 0.25 MG/5ML
0.2500 mg | Freq: Once | INTRAVENOUS | Status: AC
  Administered 2014-01-28: 0.25 mg via INTRAVENOUS
  Filled 2014-01-28: qty 5

## 2014-01-28 MED ORDER — SODIUM CHLORIDE 0.9 % IJ SOLN
10.0000 mL | INTRAMUSCULAR | Status: DC | PRN
  Administered 2014-01-28: 10 mL

## 2014-01-28 MED ORDER — POTASSIUM CHLORIDE 2 MEQ/ML IV SOLN
Freq: Once | INTRAVENOUS | Status: AC
  Administered 2014-01-28: 10:00:00 via INTRAVENOUS
  Filled 2014-01-28: qty 10

## 2014-01-28 MED ORDER — DEXAMETHASONE SODIUM PHOSPHATE 10 MG/ML IJ SOLN: 20.0000 mg | Freq: Once | INTRAMUSCULAR | Status: DC

## 2014-01-29 ENCOUNTER — Encounter (HOSPITAL_BASED_OUTPATIENT_CLINIC_OR_DEPARTMENT_OTHER): Payer: Medicaid Other

## 2014-01-29 VITALS — BP 116/78 | HR 103 | Temp 98.4°F | Resp 18

## 2014-01-29 DIAGNOSIS — C781 Secondary malignant neoplasm of mediastinum: Secondary | ICD-10-CM

## 2014-01-29 DIAGNOSIS — F79 Unspecified intellectual disabilities: Secondary | ICD-10-CM

## 2014-01-29 DIAGNOSIS — Z5111 Encounter for antineoplastic chemotherapy: Secondary | ICD-10-CM

## 2014-01-29 DIAGNOSIS — C787 Secondary malignant neoplasm of liver and intrahepatic bile duct: Secondary | ICD-10-CM

## 2014-01-29 DIAGNOSIS — C6290 Malignant neoplasm of unspecified testis, unspecified whether descended or undescended: Secondary | ICD-10-CM

## 2014-01-29 MED ORDER — SODIUM CHLORIDE 0.9 % IV SOLN
20.0000 mg | Freq: Once | INTRAVENOUS | Status: AC
  Administered 2014-01-29: 20 mg via INTRAVENOUS
  Filled 2014-01-29: qty 2

## 2014-01-29 MED ORDER — HEPARIN SOD (PORK) LOCK FLUSH 100 UNIT/ML IV SOLN
500.0000 [IU] | Freq: Once | INTRAVENOUS | Status: AC | PRN
  Administered 2014-01-29: 500 [IU]
  Filled 2014-01-29: qty 5

## 2014-01-29 MED ORDER — POTASSIUM CHLORIDE 2 MEQ/ML IV SOLN
Freq: Once | INTRAVENOUS | Status: AC
  Administered 2014-01-29: 10:00:00 via INTRAVENOUS
  Filled 2014-01-29: qty 10

## 2014-01-29 MED ORDER — SODIUM CHLORIDE 0.9 % IJ SOLN
10.0000 mL | INTRAMUSCULAR | Status: DC | PRN
  Administered 2014-01-29: 10 mL

## 2014-01-29 MED ORDER — SODIUM CHLORIDE 0.9 % IV SOLN
Freq: Once | INTRAVENOUS | Status: AC
  Administered 2014-01-29: 10:00:00 via INTRAVENOUS

## 2014-01-29 MED ORDER — SODIUM CHLORIDE 0.9 % IV SOLN
20.0000 mg/m2 | Freq: Once | INTRAVENOUS | Status: AC
  Administered 2014-01-29: 42 mg via INTRAVENOUS
  Filled 2014-01-29: qty 42

## 2014-01-29 MED ORDER — SODIUM CHLORIDE 0.9 % IV SOLN
100.0000 mg/m2 | Freq: Once | INTRAVENOUS | Status: AC
  Administered 2014-01-29: 210 mg via INTRAVENOUS
  Filled 2014-01-29: qty 10.5

## 2014-01-29 MED ORDER — DEXAMETHASONE SODIUM PHOSPHATE 10 MG/ML IJ SOLN: 20.0000 mg | Freq: Once | INTRAMUSCULAR | Status: DC

## 2014-01-30 ENCOUNTER — Encounter (HOSPITAL_BASED_OUTPATIENT_CLINIC_OR_DEPARTMENT_OTHER): Payer: Medicaid Other

## 2014-01-30 VITALS — BP 128/76 | HR 87 | Temp 97.4°F | Resp 18 | Wt 206.6 lb

## 2014-01-30 DIAGNOSIS — F79 Unspecified intellectual disabilities: Secondary | ICD-10-CM

## 2014-01-30 DIAGNOSIS — C6290 Malignant neoplasm of unspecified testis, unspecified whether descended or undescended: Secondary | ICD-10-CM

## 2014-01-30 DIAGNOSIS — Z5111 Encounter for antineoplastic chemotherapy: Secondary | ICD-10-CM

## 2014-01-30 LAB — BETA HCG QUANT (REF LAB): BETA HCG, TUMOR MARKER: 2.6 m[IU]/mL (ref <5.0)

## 2014-01-30 MED ORDER — CISPLATIN CHEMO INJECTION 100MG/100ML
20.0000 mg/m2 | Freq: Once | INTRAVENOUS | Status: AC
  Administered 2014-01-30: 42 mg via INTRAVENOUS
  Filled 2014-01-30: qty 42

## 2014-01-30 MED ORDER — SODIUM CHLORIDE 0.9 % IV SOLN
Freq: Once | INTRAVENOUS | Status: AC
  Administered 2014-01-30: 10:00:00 via INTRAVENOUS

## 2014-01-30 MED ORDER — PALONOSETRON HCL INJECTION 0.25 MG/5ML
0.2500 mg | Freq: Once | INTRAVENOUS | Status: AC
  Administered 2014-01-30: 0.25 mg via INTRAVENOUS
  Filled 2014-01-30: qty 5

## 2014-01-30 MED ORDER — SODIUM CHLORIDE 0.9 % IV SOLN
100.0000 mg/m2 | Freq: Once | INTRAVENOUS | Status: AC
  Administered 2014-01-30: 210 mg via INTRAVENOUS
  Filled 2014-01-30: qty 10.5

## 2014-01-30 MED ORDER — HEPARIN SOD (PORK) LOCK FLUSH 100 UNIT/ML IV SOLN
500.0000 [IU] | Freq: Once | INTRAVENOUS | Status: AC | PRN
  Administered 2014-01-30: 500 [IU]
  Filled 2014-01-30: qty 5

## 2014-01-30 MED ORDER — DEXAMETHASONE SODIUM PHOSPHATE 10 MG/ML IJ SOLN: 20.0000 mg | Freq: Once | INTRAMUSCULAR | Status: DC

## 2014-01-30 MED ORDER — POTASSIUM CHLORIDE 2 MEQ/ML IV SOLN
Freq: Once | INTRAVENOUS | Status: AC
  Administered 2014-01-30: 10:00:00 via INTRAVENOUS
  Filled 2014-01-30: qty 10

## 2014-01-30 MED ORDER — SODIUM CHLORIDE 0.9 % IJ SOLN
10.0000 mL | INTRAMUSCULAR | Status: DC | PRN
  Administered 2014-01-30: 10 mL

## 2014-01-30 MED ORDER — SODIUM CHLORIDE 0.9 % IV SOLN
20.0000 mg | Freq: Once | INTRAVENOUS | Status: AC
  Administered 2014-01-30: 20 mg via INTRAVENOUS
  Filled 2014-01-30: qty 2

## 2014-02-06 ENCOUNTER — Ambulatory Visit (HOSPITAL_COMMUNITY)
Admission: RE | Admit: 2014-02-06 | Discharge: 2014-02-06 | Disposition: A | Discharge status: Home / Self Care | Payer: Self-pay | Source: Ambulatory Visit | Attending: Hematology and Oncology | Admitting: Hematology and Oncology

## 2014-02-06 ENCOUNTER — Ambulatory Visit (HOSPITAL_COMMUNITY)
Admission: RE | Admit: 2014-02-06 | Discharge: 2014-02-06 | Disposition: A | Discharge status: Home / Self Care | Payer: Medicaid Other | Source: Ambulatory Visit | Attending: Hematology and Oncology | Admitting: Hematology and Oncology

## 2014-02-06 DIAGNOSIS — C781 Secondary malignant neoplasm of mediastinum: Secondary | ICD-10-CM

## 2014-02-06 DIAGNOSIS — C6290 Malignant neoplasm of unspecified testis, unspecified whether descended or undescended: Secondary | ICD-10-CM

## 2014-02-06 DIAGNOSIS — J984 Other disorders of lung: Secondary | ICD-10-CM | POA: Insufficient documentation

## 2014-02-06 MED ORDER — IOHEXOL 300 MG/ML  SOLN
100.0000 mL | Freq: Once | INTRAMUSCULAR | Status: AC | PRN
  Administered 2014-02-06: 100 mL via INTRAVENOUS

## 2014-02-11 ENCOUNTER — Telehealth (HOSPITAL_COMMUNITY): Payer: Self-pay | Admitting: Hematology and Oncology

## 2014-02-11 NOTE — Telephone Encounter (Signed)
Pc to mother re pt's outstanding balance in self pay. Left VM requesting a Call back

## 2014-02-16 ENCOUNTER — Encounter (HOSPITAL_COMMUNITY): Payer: Self-pay

## 2014-02-16 ENCOUNTER — Encounter (HOSPITAL_BASED_OUTPATIENT_CLINIC_OR_DEPARTMENT_OTHER): Payer: Medicaid Other

## 2014-02-16 VITALS — BP 112/71 | HR 97 | Temp 98.4°F | Resp 18 | Wt 202.5 lb

## 2014-02-16 DIAGNOSIS — R942 Abnormal results of pulmonary function studies: Secondary | ICD-10-CM | POA: Insufficient documentation

## 2014-02-16 DIAGNOSIS — C781 Secondary malignant neoplasm of mediastinum: Secondary | ICD-10-CM

## 2014-02-16 DIAGNOSIS — C6292 Malignant neoplasm of left testis, unspecified whether descended or undescended: Secondary | ICD-10-CM

## 2014-02-16 DIAGNOSIS — C6290 Malignant neoplasm of unspecified testis, unspecified whether descended or undescended: Secondary | ICD-10-CM

## 2014-02-16 DIAGNOSIS — R209 Unspecified disturbances of skin sensation: Secondary | ICD-10-CM

## 2014-02-16 DIAGNOSIS — R208 Other disturbances of skin sensation: Secondary | ICD-10-CM

## 2014-02-16 LAB — CBC WITH DIFFERENTIAL/PLATELET
BASOS PCT: 0 % (ref 0–1)
Basophils Absolute: 0 10*3/uL (ref 0.0–0.1)
Eosinophils Absolute: 0 10*3/uL (ref 0.0–0.7)
Eosinophils Relative: 0 % (ref 0–5)
HCT: 36 % — ABNORMAL LOW (ref 39.0–52.0)
Hemoglobin: 12.2 g/dL — ABNORMAL LOW (ref 13.0–17.0)
Lymphocytes Relative: 57 % — ABNORMAL HIGH (ref 12–46)
Lymphs Abs: 1.5 10*3/uL (ref 0.7–4.0)
MCH: 27.9 pg (ref 26.0–34.0)
MCHC: 33.9 g/dL (ref 30.0–36.0)
MCV: 82.4 fL (ref 78.0–100.0)
Monocytes Absolute: 0.7 10*3/uL (ref 0.1–1.0)
Monocytes Relative: 28 % — ABNORMAL HIGH (ref 3–12)
NEUTROS PCT: 15 % — AB (ref 43–77)
Neutro Abs: 0.4 10*3/uL — ABNORMAL LOW (ref 1.7–7.7)
PLATELETS: 210 10*3/uL (ref 150–400)
RBC: 4.37 MIL/uL (ref 4.22–5.81)
RDW: 20.8 % — ABNORMAL HIGH (ref 11.5–15.5)
WBC: 2.6 10*3/uL — ABNORMAL LOW (ref 4.0–10.5)

## 2014-02-16 LAB — COMPREHENSIVE METABOLIC PANEL
ALT: 27 U/L (ref 0–53)
AST: 19 U/L (ref 0–37)
Albumin: 4 g/dL (ref 3.5–5.2)
Alkaline Phosphatase: 82 U/L (ref 39–117)
BILIRUBIN TOTAL: 0.2 mg/dL — AB (ref 0.3–1.2)
BUN: 22 mg/dL (ref 6–23)
CALCIUM: 9.9 mg/dL (ref 8.4–10.5)
CHLORIDE: 103 meq/L (ref 96–112)
CO2: 27 meq/L (ref 19–32)
Creatinine, Ser: 0.99 mg/dL (ref 0.50–1.35)
GFR calc non Af Amer: >90 mL/min (ref >90)
GLUCOSE: 96 mg/dL (ref 70–99)
Potassium: 4.4 mEq/L (ref 3.7–5.3)
Sodium: 142 mEq/L (ref 137–147)
Total Protein: 7.9 g/dL (ref 6.0–8.3)

## 2014-02-16 LAB — AFP TUMOR MARKER: AFP-Tumor Marker: 6.1 ng/mL (ref 0.0–8.0)

## 2014-02-16 LAB — LACTATE DEHYDROGENASE: LDH: 166 U/L (ref 94–250)

## 2014-02-16 LAB — MAGNESIUM: MAGNESIUM: 2 mg/dL (ref 1.5–2.5)

## 2014-02-16 MED ORDER — SODIUM CHLORIDE 0.9 % IJ SOLN
10.0000 mL | INTRAMUSCULAR | Status: DC | PRN
  Administered 2014-02-16: 10 mL via INTRAVENOUS

## 2014-02-16 MED ORDER — HEPARIN SOD (PORK) LOCK FLUSH 100 UNIT/ML IV SOLN
500.0000 [IU] | Freq: Once | INTRAVENOUS | Status: AC
  Administered 2014-02-16: 500 [IU] via INTRAVENOUS
  Filled 2014-02-16: qty 5

## 2014-02-16 NOTE — Addendum Note (Signed)
Addended by: Mellissa Kohut on: 02/16/2014 09:59 AM   Modules accepted: Orders

## 2014-02-16 NOTE — Progress Notes (Signed)
Devon Bailey presented for Portacath access and flush. Proper placement of portacath confirmed by CXR. Portacath located right chest wall accessed with  H 20 needle. Good blood return present. Portacath flushed with 70ml NS and 500U/3ml Heparin and needle removed intact. Procedure without incident. Patient tolerated procedure well. After needle removed site cultured and culture tubes sent to the lab.

## 2014-02-16 NOTE — Patient Instructions (Signed)
[facility] Discharge Instructions  RECOMMENDATIONS MADE BY THE CONSULTANT AND ANY TEST RESULTS WILL BE SENT TO YOUR REFERRING PHYSICIAN.  EXAM FINDINGS BY THE PHYSICIAN TODAY AND SIGNS OR SYMPTOMS TO REPORT TO CLINIC OR PRIMARY PHYSICIAN: Exam and findings as discussed by Dr. Barnet Glasgow.  If tumor markers are negative we will not need to do anything.  If they are positive we will need to do a PET scan for evaluation  MEDICATIONS PRESCRIBED:  none  INSTRUCTIONS/FOLLOW-UP: Follow-up in 4 weeks with port flush in MD visit.  Thank you for choosing [facility] to provide your oncology and hematology care.  To afford each patient quality time with our providers, please arrive at least 15 minutes before your scheduled appointment time.  With your help, our goal is to use those 15 minutes to complete the necessary work-up to ensure our physicians have the information they need to help with your evaluation and healthcare recommendations.    Effective January 1st, 2014, we ask that you re-schedule your appointment with our physicians should you arrive 10 or more minutes late for your appointment.  We strive to give you quality time with our providers, and arriving late affects you and other patients whose appointments are after yours.    Again, thank you for choosing [facility].  Our hope is that these requests will decrease the amount of time that you wait before being seen by our physicians.       _____________________________________________________________  Should you have questions after your visit to [facility], please contact our office at [phone] between the hours of 8:30 a.m. and 5:00 p.m.  Voicemails left after 4:30 p.m. will not be returned until the following business day.  For prescription refill requests, have your pharmacy contact our office with your prescription refill request.

## 2014-02-16 NOTE — Progress Notes (Signed)
[facility]  OFFICE PROGRESS NOTE  Devon Berry, MD [address] [place] [address]  DIAGNOSIS: Cancer of testis - Plan: Comprehensive metabolic panel, Magnesium, AFP tumor marker, HCG, tumor marker, Lactate dehydrogenase  Chief Complaint  Patient presents with  . Testicular cancer    CURRENT THERAPY: Completed 4 cycles of chemotherapy, BEP x2, EP x2 due to 2 abnormalities in DLCO.  INTERVAL HISTORY: Devon Bailey 23 y.o. male returns for followup after completion of 4 cycles of chemotherapy for stage IV nonseminomatous testis cancer.  Ms. allergic symptoms have not worsened since the advent of spring. His primary problem is tenderness around the port without redness or fever. He denies any cough, wheezing, shortness of breath on exertion, peripheral paresthesias, lower extremity swelling or redness, diarrhea, constipation, melena, hematochezia, hematuria, urinary hesitancy, joint pain, headache, or seizures.  MEDICAL HISTORY: Past Medical History  Diagnosis Date  . Anxiety   . Seasonal allergies   . Mentally challenged     mother states mentally "slow"  . 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    ALLERGIES:  is allergic to penicillins.  MEDICATIONS: has a current medication list which includes the following prescription(s): acetaminophen, acetaminophen, cisplatin, etoposide, fluticasone, hydrocodone-acetaminophen, lidocaine-prilocaine, metoclopramide, and prochlorperazine, and the following Facility-Administered Medications: lorazepam and sodium chloride.  SURGICAL HISTORY:  Past Surgical History  Procedure Laterality Date  . Orchiectomy Left 08/07/2013    Procedure: RADICAL ORCHIECTOMY;  Surgeon: Marissa Nestle, MD;  Location: [facility];  Service: Urology;  Laterality: Left;  . Radical  orchiectomy Left 08/07/2013  . Portacath placement Right     FAMILY HISTORY: family history is not on file.  SOCIAL HISTORY:  reports that he has never smoked. He has never used smokeless tobacco. He reports that he does not drink alcohol or use illicit drugs.  REVIEW OF SYSTEMS:  Other than that discussed above is noncontributory.  PHYSICAL EXAMINATION: ECOG PERFORMANCE STATUS: 0 - Asymptomatic  There were no vitals taken for this visit.  GENERAL:alert, no distress and comfortable. Alopecia the scalp. SKIN: skin color, texture, turgor are normal, no rashes or significant lesions EYES: PERLA; Conjunctiva are pink and non-injected, sclera clear SINUSES: No redness or tenderness over maxillary or ethmoid sinuses OROPHARYNX:no exudate, no erythema on lips, buccal mucosa, or tongue. NECK: supple, thyroid normal size, non-tender, without nodularity. No masses CHEST: LifePort in place in the right anterior chest without surrounding erythema but with slight tenderness over the port itself. LYMPH:  no palpable lymphadenopathy in the cervical, axillary or inguinal LUNGS: clear to auscultation and percussion with normal breathing effort HEART: regular rate & rhythm and no murmurs. ABDOMEN:abdomen soft, non-tender and normal bowel sounds MUSCULOSKELETAL:no cyanosis of digits and no clubbing. Range of motion normal.  NEURO: alert & oriented x 3 with fluent speech, no focal motor/sensory deficits   LABORATORY DATA: Infusion on 01/26/2014  Component Date Value Ref Range Status  . WBC 01/26/2014 3.0* 4.0 - 10.5 K/uL Final  . RBC 01/26/2014 4.32  4.22 - 5.81 MIL/uL Final  . Hemoglobin 01/26/2014 11.7* 13.0 - 17.0 g/dL Final  . HCT 01/26/2014 34.3* 39.0 - 52.0 % Final  . MCV 01/26/2014 79.4  78.0 - 100.0 fL Final  . MCH 01/26/2014 27.1  26.0 - 34.0 pg Final  . MCHC 01/26/2014 34.1  30.0 - 36.0  g/dL Final  . RDW 01/26/2014 22.8* 11.5 - 15.5 % Final  . Platelets 01/26/2014 210  150 - 400 K/uL  Final  . Neutrophils Relative % 01/26/2014 44  43 - 77 % Final  . Neutro Abs 01/26/2014 1.3* 1.7 - 7.7 K/uL Final  . Lymphocytes Relative 01/26/2014 35  12 - 46 % Final  . Lymphs Abs 01/26/2014 1.1  0.7 - 4.0 K/uL Final  . Monocytes Relative 01/26/2014 20* 3 - 12 % Final  . Monocytes Absolute 01/26/2014 0.6  0.1 - 1.0 K/uL Final  . Eosinophils Relative 01/26/2014 1  0 - 5 % Final  . Eosinophils Absolute 01/26/2014 0.0  0.0 - 0.7 K/uL Final  . Basophils Relative 01/26/2014 0  0 - 1 % Final  . Basophils Absolute 01/26/2014 0.0  0.0 - 0.1 K/uL Final  . Sodium 01/26/2014 140  137 - 147 mEq/L Final  . Potassium 01/26/2014 4.2  3.7 - 5.3 mEq/L Final  . Chloride 01/26/2014 103  96 - 112 mEq/L Final  . CO2 01/26/2014 25  19 - 32 mEq/L Final  . Glucose, Bld 01/26/2014 77  70 - 99 mg/dL Final  . BUN 01/26/2014 15  6 - 23 mg/dL Final  . Creatinine, Ser 01/26/2014 0.81  0.50 - 1.35 mg/dL Final  . Calcium 01/26/2014 9.4  8.4 - 10.5 mg/dL Final  . Total Protein 01/26/2014 7.3  6.0 - 8.3 g/dL Final  . Albumin 01/26/2014 3.9  3.5 - 5.2 g/dL Final  . AST 01/26/2014 19  0 - 37 U/L Final  . ALT 01/26/2014 26  0 - 53 U/L Final  . Alkaline Phosphatase 01/26/2014 74  39 - 117 U/L Final  . Total Bilirubin 01/26/2014 <0.2* 0.3 - 1.2 mg/dL Final  . GFR calc non Af Amer 01/26/2014 >90  >90 mL/min Final  . GFR calc Af Amer 01/26/2014 >90  >90 mL/min Final   Comment: (NOTE)                          The eGFR has been calculated using the CKD EPI equation.                          This calculation has not been validated in all clinical situations.                          eGFR's persistently <90 mL/min signify possible Chronic Kidney                          Disease.  . AFP-Tumor Marker 01/26/2014 9.2* 0.0 - 8.0 ng/mL Final   Comment: (NOTE)                          The [employer] Centaur AFP immunoassay method is used.  Results obtained                          with different assay methods or kits cannot be used  interchangeably.                          AFP is a valuable aid in the management of nonseminomatous testicular  cancer patients when used in conjunction with information available                          from the clinical evaluation and other diagnostic procedures.                          Increased serum AFP concentrations have also been observed in ataxia                          telangiectasia, hereditary tyrosinemia, primary hepatocellular                          carcinoma, teratocarcinoma, gastrointestinal tract cancers with and                          without liver metastases, and in benign hepatic conditions such as                          acute viral hepatitis, chronic active hepatitis, and cirrhosis.  This                          result cannot be interpreted as absolute evidence of the presence or                          absence of malignant disease.  This result is not interpretable in                          pregnant females.                          Performed at [facility]  . Beta hCG, Tumor Marker 01/26/2014 2.6  <5.0 mIU/mL Final   Comment: (NOTE)                             Reference Range:                               Males: <5 mIU/mL                               Females: Nonpregnant: <5 mIU/mL                           Values from different assay methods may vary. The use of this                           assay to monitor or to diagnose patients with cancer or any                           condition other than pregnancy has not been approved by the FDA                           of the manufacturer of the assay.  This HCG assay is performed on the [employer]                           platform. Use of an alternative HCG assay provides corroboration                           potentially useful in distinguising true versus false elevations                           of HCG. To further rule out  false elevations of HCG, also                           consider measurement of HCG after pretreatment for human                           anti-animal [mouse] antibody (HCG, Total, with HAMA treatment),                           order code 19720.                          Performed at [facility] Visit on 01/20/2014  Component Date Value Ref Range Status  . WBC 01/20/2014 5.4  4.0 - 10.5 K/uL Final  . RBC 01/20/2014 4.67  4.22 - 5.81 MIL/uL Final  . Hemoglobin 01/20/2014 11.9* 13.0 - 17.0 g/dL Final  . HCT 01/20/2014 36.6* 39.0 - 52.0 % Final  . MCV 01/20/2014 78.4  78.0 - 100.0 fL Final  . MCH 01/20/2014 25.5* 26.0 - 34.0 pg Final  . MCHC 01/20/2014 32.5  30.0 - 36.0 g/dL Final  . RDW 01/20/2014 22.2* 11.5 - 15.5 % Final  . Platelets 01/20/2014 154  150 - 400 K/uL Final  . Neutrophils Relative % 01/20/2014 70  43 - 77 % Final  . Neutro Abs 01/20/2014 3.8  1.7 - 7.7 K/uL Final  . Lymphocytes Relative 01/20/2014 18  12 - 46 % Final  . Lymphs Abs 01/20/2014 1.0  0.7 - 4.0 K/uL Final  . Monocytes Relative 01/20/2014 12  3 - 12 % Final  . Monocytes Absolute 01/20/2014 0.7  0.1 - 1.0 K/uL Final  . Eosinophils Relative 01/20/2014 0  0 - 5 % Final  . Eosinophils Absolute 01/20/2014 0.0  0.0 - 0.7 K/uL Final  . Basophils Relative 01/20/2014 0  0 - 1 % Final  . Basophils Absolute 01/20/2014 0.0  0.0 - 0.1 K/uL Final    PATHOLOGY: FINAL for CHIJIOKE, LASSER ([id number]) Patient: DAILYN, KEMPNER Collected: 08/07/2013 Client: [facility] Accession: [id number] Received: 08/07/2013 Ricki Miller DOB: [birth year] Age: 10 Gender: M Reported: 08/12/2013 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 PLEASE SEE ONCOLOGY TEMPLATE FOR DETAILS. Microscopic Comment ONCOLOGY  TABLE - TESTIS 1. Specimen and laterality: Left testis. 2. Tumor focality: Unifocal. 3. Macroscopic extent of tumor: Confined within testis. 4. Maximum tumor size (cm): 14 cm. 5. 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 6. Microscopic tumor extension: Limited to testis. 7. Spermatic cord and surgical margins: Negative. 8. Lymph-Vascular invasion: Present. 9. Intratubular germ cell neoplasia: N/A. 10. Lymph nodes: # examined: N/A; # positive: N/A 11. TNM code: pT2, pNX 12. Serum tumor markers: See patient's medical record 13. 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 Focal angiolymphatic invasion is present. Confirmatory immunostains were performed for CD30, beta hCG, AFP, CD117, PLAP, and Pan-Cytokeratin AE1/AE3 were performed and the immunostains highlight the different components of the tumor. Control stained appropriately. Dr Saralyn Pilar agrees. (HCL:ecj 08/12/2013) Aldona Bar MD Pathologist, Electronic Signature (Case signed 08/12/2013) Specimen Gross and Clinical Information Specimen(s) Obtained: Testis, tumor, left 1 of 2 FINAL for ARISTOTELIS, VILARDI [phone]) Specimen Clinical Information left testicular tumor Gross Received in formalin is a 784-gram, 14.4 x 10.3 x 8.5 cm testicle, which includes a 10 cm segment of spermatic cord. The tunica vaginalis is translucent tan-pink. The tunica albuginea is smooth tan-white. The cut surface of the testicle shows complete replacement by a firm, tan-white to pale yellow tumor. There are small cystic spaces present measuring up to 0.7 cm. The cut surface of the epididymis is unremarkable. Sections are submitted in six cassettes: A = spermatic cord margin. B - F = tumor. (GRP:ecj 08/08/2013) Stain(s) used in Diagnosis: The following stain(s) were  used in diagnosing the case: Alpha-Feto Protein, CD117 (C-KIT), CK AE1AE3, Beta Human Chorionic Gonadotrophin, Placental Alkaline Phosphatase, CD 30. The control(s) stained appropriately. Disclaimer Some of these immunohistochemical stains may have been developed and the performance characteristics determined by [facility]. Some may not have been cleared or approved by the U.S. Food and Drug Administration. The FDA has determined that such clearance or approval is not necessary. This test is used for clinical purposes. It should not be regarded as investigational or for research. This laboratory is certified under the [place] (CLIA-88) as qualified to perform high complexity clinical laboratory testing. Report signed out from the following location(s) Technical Component performed at [place], [place] [address] CLIA: [id number]., Technical Component performed at [facility], [place], [place] [address]. CLIA #: [id number], Interpretation performed at [facility], [place], [place] [address]. CLIA    Urinalysis    Component Value Date/Time   COLORURINE YELLOW 07/05/2013 2240   APPEARANCEUR CLEAR 07/05/2013 2240   LABSPEC 1.025 07/05/2013 2240   PHURINE 8.5* 07/05/2013 [place] 07/05/2013 2240   HGBUR NEGATIVE 07/05/2013 [place] 07/05/2013 [place] 07/05/2013 2240   PROTEINUR NEGATIVE 07/05/2013 2240   UROBILINOGEN 0.2 07/05/2013 2240   NITRITE NEGATIVE 07/05/2013 2240   LEUKOCYTESUR NEGATIVE 07/05/2013 2240    RADIOGRAPHIC STUDIES: Ct Chest W Contrast  02/06/2014   COMPARISON:  PET-CT 10/30/2013.  CLINICAL DATA:  Testicular carcinoma.  EXAM: CT CHEST, ABDOMEN, AND PELVIS WITH CONTRAST  TECHNIQUE: Multidetector CT imaging of the chest, abdomen and pelvis was performed following the standard  protocol during bolus administration of intravenous contrast.  CONTRAST:  144m OMNIPAQUE IOHEXOL 300 MG/ML  SOLN  FINDINGS: CT CHEST FINDINGS  The chest wall is unremarkable and stable. A right-sided Port-A-Cath is noted. No supraclavicular or axillary mass or adenopathy. The bony thorax is intact. No destructive bone lesions or spinal canal compromise.  The heart is normal in size. No pericardial effusion.  There is a small amount of fluid in the pericardial recesses. The aorta is normal in caliber. No dissection. The esophagus is normal. The prior PET-CT showed bulky mediastinal and left hilar adenopathy and pulmonary metastatic disease. There are a few scattered small residual mediastinal and hilar lymph nodes but no overt adenopathy. The sub carinal mass previously measured 3.9 x 3.4 cm and now measures a maximum of 2.5 x 1.5 cm. There is a lobulated low-attenuation lesion in the left lower lobe which measures 6.0 x 2.3 cm. This is adjacent to a cavitary area in the left lower lobe. This is most likely treated/necrotic tumor. The pulmonary nodules have resolved. No pleural effusion.  CT ABDOMEN AND PELVIS FINDINGS  The solid abdominal organs are unremarkable and stable. There is a stable splenic lesion which is most likely a cyst. No mesenteric or retroperitoneal mass or adenopathy. There is a small stable lymph node near the medial limb of the left adrenal gland which is unchanged. The aorta is normal in caliber. The major branch vessels are normal. The major venous structures are patent. The stomach, duodenum, small bowel and colon are unremarkable and stable. No pelvic mass or adenopathy. No free pelvic fluid collections. No inguinal mass or adenopathy.  The bony structures are unremarkable.  IMPRESSION: 1. Small residual lymph nodes noted in the mediastinal and hilar regions. Findings suggest an excellent response to therapy. There is a complex left lower lobe lung lesion which is largely cystic with a  cavitary component. This is most likely treated/necrotic tumor. Continued surveillance is suggested. 2. Resolution of numerous small pulmonary nodules seen on the prior study. 3. No findings for metastatic disease or adenopathy in the abdomen/pelvis.   Electronically Signed   By: Kalman Jewels M.D.   On: 02/06/2014 12:36   Ct Abdomen Pelvis W Contrast  02/06/2014   COMPARISON:  PET-CT 10/30/2013.  CLINICAL DATA:  Testicular carcinoma.  EXAM: CT CHEST, ABDOMEN, AND PELVIS WITH CONTRAST  TECHNIQUE: Multidetector CT imaging of the chest, abdomen and pelvis was performed following the standard protocol during bolus administration of intravenous contrast.  CONTRAST:  186m OMNIPAQUE IOHEXOL 300 MG/ML  SOLN  FINDINGS: CT CHEST FINDINGS  The chest wall is unremarkable and stable. A right-sided Port-A-Cath is noted. No supraclavicular or axillary mass or adenopathy. The bony thorax is intact. No destructive bone lesions or spinal canal compromise.  The heart is normal in size. No pericardial effusion. There is a small amount of fluid in the pericardial recesses. The aorta is normal in caliber. No dissection. The esophagus is normal. The prior PET-CT showed bulky mediastinal and left hilar adenopathy and pulmonary metastatic disease. There are a few scattered small residual mediastinal and hilar lymph nodes but no overt adenopathy. The sub carinal mass previously measured 3.9 x 3.4 cm and now measures a maximum of 2.5 x 1.5 cm. There is a lobulated low-attenuation lesion in the left lower lobe which measures 6.0 x 2.3 cm. This is adjacent to a cavitary area in the left lower lobe. This is most likely treated/necrotic tumor. The pulmonary nodules have resolved. No pleural effusion.  CT ABDOMEN AND PELVIS FINDINGS  The solid abdominal organs are unremarkable and stable. There is a stable splenic lesion which is most likely a cyst. No mesenteric or retroperitoneal mass or adenopathy. There is a small stable lymph node near  the medial limb of the left adrenal gland which is unchanged. The aorta is normal in caliber. The major branch vessels are normal.  The major venous structures are patent. The stomach, duodenum, small bowel and colon are unremarkable and stable. No pelvic mass or adenopathy. No free pelvic fluid collections. No inguinal mass or adenopathy.  The bony structures are unremarkable.  IMPRESSION: 1. Small residual lymph nodes noted in the mediastinal and hilar regions. Findings suggest an excellent response to therapy. There is a complex left lower lobe lung lesion which is largely cystic with a cavitary component. This is most likely treated/necrotic tumor. Continued surveillance is suggested. 2. Resolution of numerous small pulmonary nodules seen on the prior study. 3. No findings for metastatic disease or adenopathy in the abdomen/pelvis.   Electronically Signed   By: Kalman Jewels M.D.   On: 02/06/2014 12:36    ASSESSMENT:  #1. 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 #2. Tenderness over the port, for culture. #3. Allergic rhinitis, not yet worsening in the spring. #4. Mild mental retardation. #5. 78% predicted DLCO on pulmonary function tests. This compares to 73% predicted done on 11/06/2013 prior to treatment with improvement to 86% on 12/17/2013. It was presumed that the original baseline abnormality was due to mediastinal and lymphatic involvement with tumor but with subsequent drop back to 78% on 01/01/2014,  bleomycin was discontinued and the last 2 cycles of chemotherapy given without it. DLCO has leveled off at 78% of predicted.   PLAN:  #1. Culture sensitivity of the life port  subcutaneous area. #2. If tumor markers are elevated, PET scan will be done. If markers are normal, will repeat office visit with markers in 4 weeks. #3. According to an algorithm published in the  Journal of Clinical Oncology, volume 22, #19 in October of 2004, the rate of decline of his beta-hCG value from 74,601 down to 406.6 predictive for a good outcome in poor prognosis germ cell tumor. #4. Followup in 4 weeks with CBC, chem profile, LDH, beta hCG, and alpha-fetoprotein. #5. Apply cold compresses or ice to the life port if tenderness exists along with oral nonsteroidal anti-inflammatory agent such as Aleve 1 tablet twice a day.    All questions were answered. The patient knows to call the clinic with any problems, questions or concerns. We can certainly see the patient much sooner if necessary.   I spent 25 minutes counseling the patient face to face. The total time spent in the appointment was 30 minutes.    Doroteo Bradford, MD 02/16/2014 8:32 AM

## 2014-02-18 LAB — WOUND CULTURE
CULTURE: NO GROWTH
GRAM STAIN: NONE SEEN

## 2014-02-19 LAB — BETA HCG QUANT (REF LAB)

## 2014-03-16 ENCOUNTER — Encounter (HOSPITAL_COMMUNITY): Payer: Self-pay | Attending: Hematology and Oncology

## 2014-03-16 ENCOUNTER — Encounter (HOSPITAL_COMMUNITY): Payer: Self-pay

## 2014-03-16 ENCOUNTER — Encounter (HOSPITAL_COMMUNITY): Payer: Medicaid Other

## 2014-03-16 VITALS — BP 116/82 | HR 100 | Temp 98.1°F | Resp 18 | Wt 207.5 lb

## 2014-03-16 DIAGNOSIS — C781 Secondary malignant neoplasm of mediastinum: Secondary | ICD-10-CM | POA: Insufficient documentation

## 2014-03-16 DIAGNOSIS — J301 Allergic rhinitis due to pollen: Secondary | ICD-10-CM

## 2014-03-16 DIAGNOSIS — Z8547 Personal history of malignant neoplasm of testis: Secondary | ICD-10-CM

## 2014-03-16 DIAGNOSIS — J984 Other disorders of lung: Secondary | ICD-10-CM

## 2014-03-16 DIAGNOSIS — C6290 Malignant neoplasm of unspecified testis, unspecified whether descended or undescended: Secondary | ICD-10-CM | POA: Insufficient documentation

## 2014-03-16 LAB — CBC WITH DIFFERENTIAL/PLATELET
BASOS ABS: 0 10*3/uL (ref 0.0–0.1)
BASOS PCT: 0 % (ref 0–1)
EOS PCT: 3 % (ref 0–5)
Eosinophils Absolute: 0.1 10*3/uL (ref 0.0–0.7)
HEMATOCRIT: 38.5 % — AB (ref 39.0–52.0)
Hemoglobin: 13.3 g/dL (ref 13.0–17.0)
Lymphocytes Relative: 19 % (ref 12–46)
Lymphs Abs: 1 10*3/uL (ref 0.7–4.0)
MCH: 29.3 pg (ref 26.0–34.0)
MCHC: 34.5 g/dL (ref 30.0–36.0)
MCV: 84.8 fL (ref 78.0–100.0)
MONO ABS: 0.6 10*3/uL (ref 0.1–1.0)
Monocytes Relative: 12 % (ref 3–12)
Neutro Abs: 3.3 10*3/uL (ref 1.7–7.7)
Neutrophils Relative %: 66 % (ref 43–77)
Platelets: 152 10*3/uL (ref 150–400)
RBC: 4.54 MIL/uL (ref 4.22–5.81)
RDW: 17.1 % — AB (ref 11.5–15.5)
WBC: 5.1 10*3/uL (ref 4.0–10.5)

## 2014-03-16 LAB — COMPREHENSIVE METABOLIC PANEL
ALT: 68 U/L — ABNORMAL HIGH (ref 0–53)
AST: 42 U/L — AB (ref 0–37)
Albumin: 4.1 g/dL (ref 3.5–5.2)
Alkaline Phosphatase: 92 U/L (ref 39–117)
BUN: 12 mg/dL (ref 6–23)
CALCIUM: 9.7 mg/dL (ref 8.4–10.5)
CO2: 26 meq/L (ref 19–32)
Chloride: 103 mEq/L (ref 96–112)
Creatinine, Ser: 0.94 mg/dL (ref 0.50–1.35)
GFR calc Af Amer: >90 mL/min (ref >90)
GFR calc non Af Amer: >90 mL/min (ref >90)
Glucose, Bld: 88 mg/dL (ref 70–99)
Potassium: 4.2 mEq/L (ref 3.7–5.3)
Sodium: 143 mEq/L (ref 137–147)
Total Bilirubin: 0.3 mg/dL (ref 0.3–1.2)
Total Protein: 7.6 g/dL (ref 6.0–8.3)

## 2014-03-16 LAB — LACTATE DEHYDROGENASE: LDH: 197 U/L (ref 94–250)

## 2014-03-16 MED ORDER — SODIUM CHLORIDE 0.9 % IJ SOLN
10.0000 mL | INTRAMUSCULAR | Status: DC | PRN
Start: 2014-03-16 — End: 2014-03-16
  Administered 2014-03-16: 10 mL via INTRAVENOUS

## 2014-03-16 MED ORDER — HEPARIN SOD (PORK) LOCK FLUSH 100 UNIT/ML IV SOLN
500.0000 [IU] | Freq: Once | INTRAVENOUS | Status: AC
  Administered 2014-03-16: 500 [IU] via INTRAVENOUS

## 2014-03-16 MED ORDER — HEPARIN SOD (PORK) LOCK FLUSH 100 UNIT/ML IV SOLN
INTRAVENOUS | Status: AC
  Filled 2014-03-16: qty 5

## 2014-03-16 NOTE — Progress Notes (Signed)
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 Nonseminomatous testicular cancer    CURRENT THERAPY: Completed 4 cycles of BEP chemotherapy x2, EP X2 due to abnormalities in DLCO. Currently following tumor marker studies.  INTERVAL HISTORY: Devon Bailey 23 y.o. 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  Allergies are giving him a problem because he goes out and does yard work. He refuses to wear a mask and is not taking antihistamines. He is using a nasal spray however. He denies any fever, night sweats, significant cough, wheezing, PND, orthopnea, palpitations, or peripheral paresthesias. Bowel movement are regular with no urinary frequency, hesitancy, but has had reemergence of facial acne.  MEDICAL HISTORY: Past Medical History  Diagnosis Date  . Anxiety   . Seasonal allergies   . Mentally challenged     mother states mentally "slow"  . 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    ALLERGIES:  is allergic to penicillins.  MEDICATIONS: has a current medication list which includes the following prescription(s): acetaminophen, acetaminophen, fluticasone, hydrocodone-acetaminophen, lidocaine-prilocaine, cisplatin, etoposide, metoclopramide, and prochlorperazine, and the following Facility-Administered Medications: lorazepam, sodium chloride, and sodium chloride.  SURGICAL HISTORY:  Past Surgical History  Procedure Laterality Date  . Orchiectomy Left 08/07/2013    Procedure: RADICAL ORCHIECTOMY;  Surgeon: Marissa Nestle, MD;  Location: [facility];  Service: Urology;  Laterality: Left;  . Radical orchiectomy Left 08/07/2013  . Portacath placement Right     FAMILY HISTORY: family history includes Cancer in his maternal aunt and maternal aunt.  SOCIAL HISTORY:  reports that he has never smoked. He has never used smokeless tobacco. He reports that he does not drink alcohol or use illicit drugs.  REVIEW OF SYSTEMS:  Other than that discussed above is noncontributory.  PHYSICAL EXAMINATION: ECOG PERFORMANCE STATUS: 1 - Symptomatic but completely ambulatory  Blood pressure 116/82, pulse 100, temperature 98.1 F (36.7 C), temperature source Oral, resp. rate 18, weight 207 lb 8 oz (94.121 kg), SpO2 100.00%.  GENERAL:alert, no distress and comfortable SKIN: Grade 3 acne on the face. EYES: PERLA; Conjunctiva are pink and non-injected, sclera clear SINUSES: No redness or tenderness over maxillary or ethmoid sinuses OROPHARYNX:no exudate, no erythema on lips, buccal mucosa, or tongue. NECK: supple, thyroid normal size, non-tender, without nodularity. No masses CHEST: Normal AP diameter with no breast masses. LifePort in place. LYMPH:  no palpable lymphadenopathy in the cervical, axillary or inguinal LUNGS: clear to auscultation and percussion with normal breathing  effort HEART: regular rate & rhythm and no murmurs. ABDOMEN:abdomen soft, non-tender and  normal bowel sounds MUSCULOSKELETAL:no cyanosis of digits and no clubbing. Range of motion normal.  NEURO: alert & oriented x 3 with fluent speech, no focal motor/sensory deficits   LABORATORY DATA: Office Visit on 03/16/2014  Component Date Value Ref Range Status  . WBC 03/16/2014 5.1  4.0 - 10.5 K/uL Final  . RBC 03/16/2014 4.54  4.22 - 5.81 MIL/uL Final  . Hemoglobin 03/16/2014 13.3  13.0 - 17.0 g/dL Final  . HCT 03/16/2014 38.5* 39.0 - 52.0 % Final  . MCV 03/16/2014 84.8  78.0 - 100.0 fL Final  . MCH 03/16/2014 29.3  26.0 - 34.0 pg Final  . MCHC 03/16/2014 34.5  30.0 - 36.0 g/dL Final  . RDW 03/16/2014 17.1* 11.5 - 15.5 % Final  . Platelets 03/16/2014 152  150 - 400 K/uL Final  . Neutrophils Relative % 03/16/2014 66  43 - 77 % Final  . Neutro Abs 03/16/2014 3.3  1.7 - 7.7 K/uL Final  . Lymphocytes Relative 03/16/2014 19  12 - 46 % Final  . Lymphs Abs 03/16/2014 1.0  0.7 - 4.0 K/uL Final  . Monocytes Relative 03/16/2014 12  3 - 12 % Final  . Monocytes Absolute 03/16/2014 0.6  0.1 - 1.0 K/uL Final  . Eosinophils Relative 03/16/2014 3  0 - 5 % Final  . Eosinophils Absolute 03/16/2014 0.1  0.0 - 0.7 K/uL Final  . Basophils Relative 03/16/2014 0  0 - 1 % Final  . Basophils Absolute 03/16/2014 0.0  0.0 - 0.1 K/uL Final  . Sodium 03/16/2014 143  137 - 147 mEq/L Final  . Potassium 03/16/2014 4.2  3.7 - 5.3 mEq/L Final  . Chloride 03/16/2014 103  96 - 112 mEq/L Final  . CO2 03/16/2014 26  19 - 32 mEq/L Final  . Glucose, Bld 03/16/2014 88  70 - 99 mg/dL Final  . BUN 03/16/2014 12  6 - 23 mg/dL Final  . Creatinine, Ser 03/16/2014 0.94  0.50 - 1.35 mg/dL Final  . Calcium 03/16/2014 9.7  8.4 - 10.5 mg/dL Final  . Total Protein 03/16/2014 7.6  6.0 - 8.3 g/dL Final  . Albumin 03/16/2014 4.1  3.5 - 5.2 g/dL Final  . AST 03/16/2014 42* 0 - 37 U/L Final  . ALT 03/16/2014 68* 0 - 53 U/L Final  .  Alkaline Phosphatase 03/16/2014 92  39 - 117 U/L Final  . Total Bilirubin 03/16/2014 0.3  0.3 - 1.2 mg/dL Final  . GFR calc non Af Amer 03/16/2014 >90  >90 mL/min Final  . GFR calc Af Amer 03/16/2014 >90  >90 mL/min Final   Comment: (NOTE)                          The eGFR has been calculated using the CKD EPI equation.                          This calculation has not been validated in all clinical situations.                          eGFR's persistently <90 mL/min signify possible Chronic Kidney                          Disease.  Marland Kitchen LDH 03/16/2014 197  94 - 250 U/L Final  Office Visit on 02/16/2014  Component Date Value Ref Range Status  .  Sodium 02/16/2014 142  137 - 147 mEq/L Final  . Potassium 02/16/2014 4.4  3.7 - 5.3 mEq/L Final  . Chloride 02/16/2014 103  96 - 112 mEq/L Final  . CO2 02/16/2014 27  19 - 32 mEq/L Final  . Glucose, Bld 02/16/2014 96  70 - 99 mg/dL Final  . BUN 02/16/2014 22  6 - 23 mg/dL Final  . Creatinine, Ser 02/16/2014 0.99  0.50 - 1.35 mg/dL Final  . Calcium 02/16/2014 9.9  8.4 - 10.5 mg/dL Final  . Total Protein 02/16/2014 7.9  6.0 - 8.3 g/dL Final  . Albumin 02/16/2014 4.0  3.5 - 5.2 g/dL Final  . AST 02/16/2014 19  0 - 37 U/L Final  . ALT 02/16/2014 27  0 - 53 U/L Final  . Alkaline Phosphatase 02/16/2014 82  39 - 117 U/L Final  . Total Bilirubin 02/16/2014 0.2* 0.3 - 1.2 mg/dL Final  . GFR calc non Af Amer 02/16/2014 >90  >90 mL/min Final  . GFR calc Af Amer 02/16/2014 >90  >90 mL/min Final   Comment: (NOTE)                          The eGFR has been calculated using the CKD EPI equation.                          This calculation has not been validated in all clinical situations.                          eGFR's persistently <90 mL/min signify possible Chronic Kidney                          Disease.  . Magnesium 02/16/2014 2.0  1.5 - 2.5 mg/dL Final  . AFP-Tumor Marker 02/16/2014 6.1  0.0 - 8.0 ng/mL Final   Comment: (NOTE)                          The  [employer] Centaur AFP immunoassay method is used.  Results obtained                          with different assay methods or kits cannot be used interchangeably.                          AFP is a valuable aid in the management of nonseminomatous testicular                          cancer patients when used in conjunction with information available                          from the clinical evaluation and other diagnostic procedures.                          Increased serum AFP concentrations have also been observed in ataxia                          telangiectasia, hereditary tyrosinemia, primary hepatocellular  carcinoma, teratocarcinoma, gastrointestinal tract cancers with and                          without liver metastases, and in benign hepatic conditions such as                          acute viral hepatitis, chronic active hepatitis, and cirrhosis.  This                          result cannot be interpreted as absolute evidence of the presence or                          absence of malignant disease.  This result is not interpretable in                          pregnant females.                          Performed at [facility]  . Beta hCG, Tumor Marker 02/16/2014 < 2.0  < 5.0 mIU/mL Final   Comment: (NOTE)                             Reference Range:                               Males: <5 mIU/mL                               Females: Nonpregnant: <5 mIU/mL                           Values from different assay methods may vary. The use of this                           assay to monitor or to diagnose patients with cancer or any                           condition other than pregnancy has not been approved by the FDA                           of the manufacturer of the assay.                           This HCG assay is performed on the [employer]                           platform. Use of an alternative HCG assay provides corroboration                            potentially useful in distinguising true versus false elevations                           of HCG. To further  rule out false elevations of HCG, also                           consider measurement of HCG after pretreatment for human                           anti-animal [mouse] antibody (HCG, Total, with HAMA treatment),                           order code 19720.                          Performed at [facility]  . LDH 02/16/2014 166  94 - 250 U/L Final  . WBC 02/16/2014 2.6* 4.0 - 10.5 K/uL Final  . RBC 02/16/2014 4.37  4.22 - 5.81 MIL/uL Final  . Hemoglobin 02/16/2014 12.2* 13.0 - 17.0 g/dL Final  . HCT 02/16/2014 36.0* 39.0 - 52.0 % Final  . MCV 02/16/2014 82.4  78.0 - 100.0 fL Final  . MCH 02/16/2014 27.9  26.0 - 34.0 pg Final  . MCHC 02/16/2014 33.9  30.0 - 36.0 g/dL Final  . RDW 02/16/2014 20.8* 11.5 - 15.5 % Final  . Platelets 02/16/2014 210  150 - 400 K/uL Final  . Neutrophils Relative % 02/16/2014 15* 43 - 77 % Final  . Neutro Abs 02/16/2014 0.4* 1.7 - 7.7 K/uL Final  . Lymphocytes Relative 02/16/2014 57* 12 - 46 % Final  . Lymphs Abs 02/16/2014 1.5  0.7 - 4.0 K/uL Final  . Monocytes Relative 02/16/2014 28* 3 - 12 % Final  . Monocytes Absolute 02/16/2014 0.7  0.1 - 1.0 K/uL Final  . Eosinophils Relative 02/16/2014 0  0 - 5 % Final  . Eosinophils Absolute 02/16/2014 0.0  0.0 - 0.7 K/uL Final  . Basophils Relative 02/16/2014 0  0 - 1 % Final  . Basophils Absolute 02/16/2014 0.0  0.0 - 0.1 K/uL Final  . Specimen Description 02/16/2014 WOUND SKIN PORTA CATH AREA   Final  . Special Requests 02/16/2014 NONE   Final  . Gram Stain 02/16/2014    Final                   Value:NO WBC SEEN                         NO SQUAMOUS EPITHELIAL CELLS SEEN                         NO ORGANISMS SEEN                         Performed at [facility]  . Culture 02/16/2014    Final                   Value:NO GROWTH 2 DAYS                         Performed at  [facility]  . Report Status 02/16/2014 02/18/2014 FINAL   Final    PATHOLOGY: No new pathology.  Urinalysis    Component Value Date/Time   COLORURINE YELLOW 07/05/2013 [place] 07/05/2013 2240   LABSPEC 1.025 07/05/2013 [place]  8.5* 07/05/2013 2240   GLUCOSEU NEGATIVE 07/05/2013 2240   HGBUR NEGATIVE 07/05/2013 [place] 07/05/2013 [place] 07/05/2013 2240   PROTEINUR NEGATIVE 07/05/2013 2240   UROBILINOGEN 0.2 07/05/2013 2240   NITRITE NEGATIVE 07/05/2013 2240   LEUKOCYTESUR NEGATIVE 07/05/2013 2240    RADIOGRAPHIC STUDIES: No results found.  ASSESSMENT:  #1. 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 #2. Tenderness over the port, negative culture done 4 weeks ago, resolved. #3. Allergic rhinitis, worsening, refusing to use recommended antihistamines. #4. Mild mental retardation.  #5. 78% predicted DLCO on pulmonary function tests. This compares to 73% predicted done on 11/06/2013 prior to treatment with improvement to 86% on 12/17/2013. It was presumed that the original baseline abnormality was due to mediastinal and lymphatic involvement with tumor but with subsequent drop back to 78% on 01/01/2014, bleomycin was discontinued and the last 2 cycles of chemotherapy given without it. DLCO has leveled off at 78% of predicted. #6. Reemergence of facial acne.    PLAN:  #1. Tumor markers today along with life port flush. #2. Followup in 6 weeks with lab tests and life port flush.   All questions were answered. The patient knows to call the clinic with any problems, questions or concerns. We can certainly see the patient much sooner if necessary.   I spent 25 minutes counseling the patient face to face. The total time spent in the appointment was 30 minutes.      Farrel Gobble, MD 03/16/2014 1:50 PM  DISCLAIMER:  This note was dictated with voice recognition software.  Similar sounding words can inadvertently be transcribed inaccurately and may not be corrected upon review.

## 2014-03-16 NOTE — Progress Notes (Signed)
Devon Bailey presented for Portacath access and flush.  Proper placement of portacath confirmed by CXR.  Portacath located RIGHTchest wall accessed with  H 20 needle.  Good blood return present. Portacath flushed with 54ml NS and 500U/80ml Heparin and needle removed intact.  Procedure tolerated well and without incident.   Labs drawn from port and sent to the lab.

## 2014-03-16 NOTE — Patient Instructions (Signed)
[facility] Discharge Instructions  RECOMMENDATIONS MADE BY THE CONSULTANT AND ANY TEST RESULTS WILL BE SENT TO YOUR REFERRING PHYSICIAN.  We will see you in 6 weeks for a port flush and you will see the doctor. We will also repeat your lab work at that time.  Thank you for choosing [facility] to provide your oncology and hematology care.  To afford each patient quality time with our providers, please arrive at least 15 minutes before your scheduled appointment time.  With your help, our goal is to use those 15 minutes to complete the necessary work-up to ensure our physicians have the information they need to help with your evaluation and healthcare recommendations.    Effective January 1st, 2014, we ask that you re-schedule your appointment with our physicians should you arrive 10 or more minutes late for your appointment.  We strive to give you quality time with our providers, and arriving late affects you and other patients whose appointments are after yours.    Again, thank you for choosing [facility].  Our hope is that these requests will decrease the amount of time that you wait before being seen by our physicians.       _____________________________________________________________  Should you have questions after your visit to [facility], please contact our office at [phone] between the hours of 8:30 a.m. and 5:00 p.m.  Voicemails left after 4:30 p.m. will not be returned until the following business day.  For prescription refill requests, have your pharmacy contact our office with your prescription refill request.

## 2014-04-27 ENCOUNTER — Other Ambulatory Visit (HOSPITAL_COMMUNITY): Payer: Self-pay

## 2014-04-27 ENCOUNTER — Encounter (HOSPITAL_COMMUNITY): Payer: Medicaid Other

## 2014-04-27 ENCOUNTER — Encounter (HOSPITAL_COMMUNITY): Payer: Self-pay | Attending: Hematology and Oncology

## 2014-04-27 ENCOUNTER — Encounter (HOSPITAL_COMMUNITY): Payer: Self-pay

## 2014-04-27 VITALS — BP 137/81 | HR 105 | Temp 98.1°F | Resp 18 | Wt 214.0 lb

## 2014-04-27 DIAGNOSIS — C6290 Malignant neoplasm of unspecified testis, unspecified whether descended or undescended: Secondary | ICD-10-CM

## 2014-04-27 DIAGNOSIS — C781 Secondary malignant neoplasm of mediastinum: Secondary | ICD-10-CM

## 2014-04-27 DIAGNOSIS — J309 Allergic rhinitis, unspecified: Secondary | ICD-10-CM

## 2014-04-27 DIAGNOSIS — C7800 Secondary malignant neoplasm of unspecified lung: Secondary | ICD-10-CM

## 2014-04-27 DIAGNOSIS — F79 Unspecified intellectual disabilities: Secondary | ICD-10-CM

## 2014-04-27 LAB — CBC WITH DIFFERENTIAL/PLATELET
Basophils Absolute: 0 10*3/uL (ref 0.0–0.1)
Basophils Relative: 0 % (ref 0–1)
EOS PCT: 4 % (ref 0–5)
Eosinophils Absolute: 0.2 10*3/uL (ref 0.0–0.7)
HEMATOCRIT: 41.1 % (ref 39.0–52.0)
HEMOGLOBIN: 14 g/dL (ref 13.0–17.0)
Lymphocytes Relative: 25 % (ref 12–46)
Lymphs Abs: 1.4 10*3/uL (ref 0.7–4.0)
MCH: 30.2 pg (ref 26.0–34.0)
MCHC: 34.1 g/dL (ref 30.0–36.0)
MCV: 88.8 fL (ref 78.0–100.0)
MONOS PCT: 15 % — AB (ref 3–12)
Monocytes Absolute: 0.8 10*3/uL (ref 0.1–1.0)
Neutro Abs: 3.2 10*3/uL (ref 1.7–7.7)
Neutrophils Relative %: 56 % (ref 43–77)
Platelets: 220 10*3/uL (ref 150–400)
RBC: 4.63 MIL/uL (ref 4.22–5.81)
RDW: 12.5 % (ref 11.5–15.5)
WBC: 5.7 10*3/uL (ref 4.0–10.5)

## 2014-04-27 LAB — COMPREHENSIVE METABOLIC PANEL
ALBUMIN: 4.3 g/dL (ref 3.5–5.2)
ALT: 35 U/L (ref 0–53)
AST: 21 U/L (ref 0–37)
Alkaline Phosphatase: 91 U/L (ref 39–117)
BILIRUBIN TOTAL: 0.2 mg/dL — AB (ref 0.3–1.2)
BUN: 17 mg/dL (ref 6–23)
CHLORIDE: 103 meq/L (ref 96–112)
CO2: 26 meq/L (ref 19–32)
CREATININE: 0.96 mg/dL (ref 0.50–1.35)
Calcium: 9.8 mg/dL (ref 8.4–10.5)
GFR calc Af Amer: >90 mL/min (ref >90)
Glucose, Bld: 101 mg/dL — ABNORMAL HIGH (ref 70–99)
Potassium: 3.9 mEq/L (ref 3.7–5.3)
Sodium: 143 mEq/L (ref 137–147)
Total Protein: 7.8 g/dL (ref 6.0–8.3)

## 2014-04-27 LAB — LACTATE DEHYDROGENASE: LDH: 167 U/L (ref 94–250)

## 2014-04-27 MED ORDER — SODIUM CHLORIDE 0.9 % IJ SOLN
10.0000 mL | INTRAMUSCULAR | Status: DC | PRN
  Administered 2014-04-27: 10 mL via INTRAVENOUS

## 2014-04-27 MED ORDER — LIDOCAINE-PRILOCAINE 2.5-2.5 % EX CREA: TOPICAL_CREAM | CUTANEOUS | Status: DC

## 2014-04-27 MED ORDER — HEPARIN SOD (PORK) LOCK FLUSH 100 UNIT/ML IV SOLN
500.0000 [IU] | Freq: Once | INTRAVENOUS | Status: AC
  Administered 2014-04-27: 500 [IU] via INTRAVENOUS
  Filled 2014-04-27: qty 5

## 2014-04-27 NOTE — Patient Instructions (Signed)
[facility] Discharge Instructions  RECOMMENDATIONS MADE BY THE CONSULTANT AND ANY TEST RESULTS WILL BE SENT TO YOUR REFERRING PHYSICIAN.  Return in 6 weeks for office visit, blood work and port flush.  Thank you for choosing [facility] to provide your oncology and hematology care.  To afford each patient quality time with our providers, please arrive at least 15 minutes before your scheduled appointment time.  With your help, our goal is to use those 15 minutes to complete the necessary work-up to ensure our physicians have the information they need to help with your evaluation and healthcare recommendations.    Effective January 1st, 2014, we ask that you re-schedule your appointment with our physicians should you arrive 10 or more minutes late for your appointment.  We strive to give you quality time with our providers, and arriving late affects you and other patients whose appointments are after yours.    Again, thank you for choosing [facility].  Our hope is that these requests will decrease the amount of time that you wait before being seen by our physicians.       _____________________________________________________________  Should you have questions after your visit to [facility], please contact our office at [phone] between the hours of 8:30 a.m. and 5:00 p.m.  Voicemails left after 4:30 p.m. will not be returned until the following business day.  For prescription refill requests, have your pharmacy contact our office with your prescription refill request.

## 2014-04-27 NOTE — Progress Notes (Signed)
Devon Bailey presented for Portacath access and flush.  Portacath located right chest wall accessed with  H 20 needle.  Good blood return present. Portacath flushed with 98ml NS and 500U/6ml Heparin and needle removed intact.  Procedure tolerated well and without incident.

## 2014-04-27 NOTE — Progress Notes (Signed)
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 Treated with 4 cycles of  chemotherapy having received 2 cycles of BEP and 2 cycles of EP due to  abnormalities in DLCO, currently following tumor marker studies.  INTERVAL HISTORY: Devon Bailey 23 y.o. 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 He received 2 cycles of BEP and 2 cycles of EP due to  DLCO abnormalities. He also suffers from seasonal rhinitis and mild mental retardation. He is no longer mowing the grass because of allergy symptoms. He is concerned about some bluish discoloration above is poor. He denies any fever, night sweats, peripheral paresthesias, cough, wheezing, sore throat, earache, abdominal pain, urinary hesitancy, lower extremity swelling or redness, PND, orthopnea, palpitations, skin rash, headache, or seizures. Self testicular examination is unremarkable.  MEDICAL HISTORY: Past Medical History  Diagnosis Date  . Anxiety   . Seasonal allergies   . Mentally challenged     mother states mentally "slow"  . 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    ALLERGIES:  is allergic to penicillins.  MEDICATIONS: has a current medication list which includes the following prescription(s): ibuprofen, lidocaine-prilocaine, acetaminophen, acetaminophen, cisplatin, etoposide, fluticasone, hydrocodone-acetaminophen, metoclopramide, and prochlorperazine, and the following Facility-Administered Medications: lorazepam, sodium chloride, and sodium chloride.  SURGICAL HISTORY:  Past Surgical History  Procedure Laterality Date  . Orchiectomy Left 08/07/2013    Procedure: RADICAL ORCHIECTOMY;  Surgeon: Marissa Nestle, MD;  Location: [facility];  Service: Urology;  Laterality: Left;  . Radical orchiectomy Left 08/07/2013  . Portacath placement Right     FAMILY HISTORY: family history includes Cancer in his maternal aunt and maternal aunt.  SOCIAL HISTORY:  reports that he has never smoked. He has never used smokeless tobacco. He reports that he does not drink alcohol or use illicit drugs.  REVIEW OF SYSTEMS:  Other than that discussed above is noncontributory.  PHYSICAL EXAMINATION: ECOG PERFORMANCE STATUS: 1 - Symptomatic but completely ambulatory  Blood pressure 137/81, pulse 105, temperature 98.1 F (36.7 C), temperature source Oral, resp. rate 18, weight 214 lb (97.07 kg).  GENERAL:alert, no distress and comfortable SKIN: skin color, texture, turgor are normal, no rashes or significant lesions EYES: PERLA; Conjunctiva are pink and non-injected, sclera clear SINUSES: No redness or tenderness over maxillary or ethmoid sinuses OROPHARYNX:no exudate, no erythema on lips, buccal mucosa, or tongue. NECK: supple, thyroid normal size, non-tender, without nodularity. No masses CHEST: LifePort in place the right anterior chest. No gynecomastia. LYMPH:   no palpable lymphadenopathy in the cervical, axillary or inguinal LUNGS: clear to auscultation  and percussion with normal breathing effort HEART: regular rate & rhythm and no murmurs. ABDOMEN:abdomen soft, non-tender and normal bowel sounds MUSCULOSKELETAL:no cyanosis of digits and no clubbing. Range of motion normal.  NEURO: alert & oriented x 3 with fluent speech, no focal motor/sensory deficits GENITALIA: Absent left testis. Right testis without mass.   LABORATORY DATA:  Results for Devon, Bailey (MRN [id number]) as of 04/27/2014 09:31  Ref. Range 11/24/2013 08:43 12/15/2013 09:20 01/05/2014 09:32 01/26/2014 10:30 02/16/2014 09:47  Beta hCG, Tumor Marker Latest Range: < 5.0 mIU/mL 74601.0 (H) 406.6 (H) 14.8 (H) 2.6 < 2.0   Results for Devon, Bailey (MRN [id number]) as of 04/27/2014 09:31  Ref. Range 11/24/2013 08:43 12/15/2013 09:20 01/05/2014 09:32 01/26/2014 10:30 02/16/2014 09:47  AFP-Tumor Marker Latest Range: 0.0-8.0 ng/mL 74.6 (H) 17.7 (H) 7.4 9.2 (H) 6.1    Office Visit on 04/27/2014  Component Date Value Ref Range Status  . WBC 04/27/2014 5.7  4.0 - 10.5 K/uL Final  . RBC 04/27/2014 4.63  4.22 - 5.81 MIL/uL Final  . Hemoglobin 04/27/2014 14.0  13.0 - 17.0 g/dL Final  . HCT 04/27/2014 41.1  39.0 - 52.0 % Final  . MCV 04/27/2014 88.8  78.0 - 100.0 fL Final  . MCH 04/27/2014 30.2  26.0 - 34.0 pg Final  . MCHC 04/27/2014 34.1  30.0 - 36.0 g/dL Final  . RDW 04/27/2014 12.5  11.5 - 15.5 % Final  . Platelets 04/27/2014 220  150 - 400 K/uL Final  . Neutrophils Relative % 04/27/2014 56  43 - 77 % Final  . Neutro Abs 04/27/2014 3.2  1.7 - 7.7 K/uL Final  . Lymphocytes Relative 04/27/2014 25  12 - 46 % Final  . Lymphs Abs 04/27/2014 1.4  0.7 - 4.0 K/uL Final  . Monocytes Relative 04/27/2014 15* 3 - 12 % Final  . Monocytes Absolute 04/27/2014 0.8  0.1 - 1.0 K/uL Final  . Eosinophils Relative 04/27/2014 4  0 - 5 % Final  . Eosinophils Absolute 04/27/2014 0.2  0.0 - 0.7 K/uL Final  . Basophils  Relative 04/27/2014 0  0 - 1 % Final  . Basophils Absolute 04/27/2014 0.0  0.0 - 0.1 K/uL Final    PATHOLOGY: No new pathology.  Urinalysis    Component Value Date/Time   COLORURINE YELLOW 07/05/2013 2240   APPEARANCEUR CLEAR 07/05/2013 2240   LABSPEC 1.025 07/05/2013 2240   PHURINE 8.5* 07/05/2013 2240   GLUCOSEU NEGATIVE 07/05/2013 2240   HGBUR NEGATIVE 07/05/2013 [place] 07/05/2013 2240   KETONESUR NEGATIVE 07/05/2013 2240   PROTEINUR NEGATIVE 07/05/2013 2240   UROBILINOGEN 0.2 07/05/2013 2240   NITRITE NEGATIVE 07/05/2013 2240   LEUKOCYTESUR NEGATIVE 07/05/2013 2240    RADIOGRAPHIC STUDIES: No results found.  ASSESSMENT:  #1. Nonseminomatous germ cell tumor of the testis, stage IV, status post 4 cycles of chemotherapy, no evidence of disease on CT scan, was normal tumor markers done one month ago, for repeat studies today.  #2. Tenderness over the port, negative culture done 4 weeks ago, resolved.  #3. Allergic rhinitis, worsening, refusing to use recommended antihistamines.  #4. Mild mental retardation.  #5. 78% predicted DLCO on pulmonary function tests. This compares to 73% predicted done on 11/06/2013 prior to treatment with improvement to 86% on 12/17/2013. It was presumed that the original baseline abnormality was due to mediastinal and lymphatic involvement with tumor but with subsequent drop back to 78% on 01/01/2014, bleomycin was discontinued and the last 2 cycles of chemotherapy  given without it. DLCO has leveled off at 78% of predicted.  #6. Reemergence of facial acne    PLAN:  #1. If tumor markers are abnormal, a CT scan will be done. #2. Followup in 6 weeks with lab tests and physical exam.   All questions were answered. The patient knows to call the clinic with any problems, questions or concerns. We can certainly see the patient much sooner if necessary.   I spent 25 minutes counseling the patient face to face. The total time spent in the  appointment was 30 minutes.    Farrel Gobble, MD 04/27/2014 12:00 PM  DISCLAIMER:  This note was dictated with voice recognition software.  Similar sounding words can inadvertently be transcribed inaccurately and may not be corrected upon review.

## 2014-04-28 LAB — AFP TUMOR MARKER: AFP-Tumor Marker: 4.8 ng/mL (ref 0.0–8.0)

## 2014-04-30 LAB — BETA HCG QUANT (REF LAB): Beta hCG, Tumor Marker: <2 m[IU]/mL (ref <5.0)

## 2014-05-20 HISTORY — PX: PORT-A-CATH REMOVAL: SHX5289

## 2014-05-27 ENCOUNTER — Emergency Department (HOSPITAL_COMMUNITY)
Admission: EM | Admit: 2014-05-27 | Discharge: 2014-05-27 | Disposition: A | Discharge status: Home / Self Care | Payer: Medicaid Other | Attending: Emergency Medicine | Admitting: Emergency Medicine

## 2014-05-27 ENCOUNTER — Encounter (HOSPITAL_COMMUNITY): Payer: Self-pay | Admitting: Emergency Medicine

## 2014-05-27 DIAGNOSIS — F840 Autistic disorder: Secondary | ICD-10-CM | POA: Insufficient documentation

## 2014-05-27 DIAGNOSIS — Z79899 Other long term (current) drug therapy: Secondary | ICD-10-CM | POA: Insufficient documentation

## 2014-05-27 DIAGNOSIS — Y838 Other surgical procedures as the cause of abnormal reaction of the patient, or of later complication, without mention of misadventure at the time of the procedure: Secondary | ICD-10-CM | POA: Insufficient documentation

## 2014-05-27 DIAGNOSIS — F411 Generalized anxiety disorder: Secondary | ICD-10-CM | POA: Insufficient documentation

## 2014-05-27 DIAGNOSIS — Z95828 Presence of other vascular implants and grafts: Secondary | ICD-10-CM

## 2014-05-27 DIAGNOSIS — Z880 Allergy status to penicillin: Secondary | ICD-10-CM | POA: Insufficient documentation

## 2014-05-27 DIAGNOSIS — Z791 Long term (current) use of non-steroidal anti-inflammatories (NSAID): Secondary | ICD-10-CM | POA: Insufficient documentation

## 2014-05-27 DIAGNOSIS — T8579XA Infection and inflammatory reaction due to other internal prosthetic devices, implants and grafts, initial encounter: Secondary | ICD-10-CM | POA: Insufficient documentation

## 2014-05-27 NOTE — Discharge Instructions (Signed)
Followup at the cancer center tomorrow. Return here for fever, drainage from the port, or any other problems

## 2014-05-27 NOTE — ED Notes (Signed)
Pt getting chemo thru port, in May had what looked like a pimple, popped this week, area turned purple and rubber port is exposed now

## 2014-05-27 NOTE — ED Provider Notes (Signed)
CSN: [id number]     Arrival date & time 05/27/14  1852 History   First MD Initiated Contact with Patient 05/27/14 1923     Chief Complaint  Patient presents with  . port infection      (Consider location/radiation/quality/duration/timing/severity/associated sxs/prior Treatment) The history is provided by the patient.   patient here complaining of possible infected chemotherapy port on the right side of his chest. Denies any fever or chills. No redness or drainage from the area. Patient noted that he did have what looked to be a papule there which she ruptured last week and it did turn purple and now he has possible material from the port exposed. No shortness of breath. No chest pain. Symptoms persisted and he went to his coming care doctor who sent here for further evaluation  Past Medical History  Diagnosis Date  . Anxiety   . Seasonal allergies   . Mentally challenged     mother states mentally "slow"  . Autistic disorder    Past Surgical History  Procedure Laterality Date  . Orchiectomy Left 08/07/2013    Procedure: RADICAL ORCHIECTOMY;  Surgeon: Marissa Nestle, MD;  Location: [facility];  Service: Urology;  Laterality: Left;  . Radical orchiectomy Left 08/07/2013  . Portacath placement Right    Family History  Problem Relation Age of Onset  . Cancer Maternal Aunt   . Cancer Maternal Aunt    History  Substance Use Topics  . Smoking status: Never Smoker   . Smokeless tobacco: Never Used  . Alcohol Use: No    Review of Systems  All other systems reviewed and are negative.     Allergies  Penicillins  Home Medications   Prior to Admission medications   Medication Sig Start Date End Date Taking? Authorizing Provider  acetaminophen (TYLENOL) 325 MG tablet Take 650 mg by mouth. On the days of Bleomycin chemo, start taking 650mg  in the am and continue every 6 hours x 48 hours. This is to prevent hyperthermia reaction to the bleomycin per Dr. Barnet Glasgow.    Historical  Provider, MD  acetaminophen (TYLENOL) 500 MG tablet Take 500 mg by mouth every 6 (six) hours as needed for mild pain or moderate pain.     Historical Provider, MD  CISPLATIN IV Inject into the vein. To start chemo 11/24/12    Historical Provider, MD  ETOPOSIDE IV Inject into the vein. To start chemo 11/24/12.    Historical Provider, MD  fluticasone (FLONASE) 50 MCG/ACT nasal spray Two puffs in each nostril at bedtime daily. 01/20/14   Farrel Gobble, MD  HYDROcodone-acetaminophen (NORCO/VICODIN) 5-325 MG per tablet Take 1 tablet by mouth every 6 (six) hours as needed for moderate pain. 12/02/13   Baird Cancer, PA-C  ibuprofen (ADVIL,MOTRIN) 400 MG tablet Take 400 mg by mouth every 6 (six) hours as needed.    Historical Provider, MD  lidocaine-prilocaine (EMLA) cream Apply a quarter size amount to port site 1 hour prior to chemo. Do not rub in. Cover with plastic wrap. 04/27/14   Baird Cancer, PA-C  metoCLOPramide (REGLAN) 5 MG tablet Take 1 tablet (5 mg total) by mouth 4 (four) times daily as needed for nausea. 11/03/13   Farrel Gobble, MD  prochlorperazine (COMPAZINE) 10 MG tablet Take 1 tablet (10 mg total) by mouth 4 (four) times daily as needed for nausea or vomiting. 11/03/13   Farrel Gobble, MD   BP 141/82  Pulse 85  Temp(Src) 97.7 F (36.5 C) (Oral)  Resp  24  Ht 5\' 10"  (1.778 m)  Wt 216 lb (97.977 kg)  BMI 30.99 kg/m2  SpO2 98% Physical Exam  Nursing note and vitals reviewed. Constitutional: He is oriented to person, place, and time. He appears well-developed and well-nourished.  Non-toxic appearance. No distress.  HENT:  Head: Normocephalic and atraumatic.  Eyes: Conjunctivae, EOM and lids are normal. Pupils are equal, round, and reactive to light.  Neck: Normal range of motion. Neck supple. No tracheal deviation present. No mass present.  Cardiovascular: Normal rate, regular rhythm and normal heart sounds.  Exam reveals no gallop.   No murmur heard. Pulmonary/Chest:  Effort normal and breath sounds normal. No stridor. No respiratory distress. He has no decreased breath sounds. He has no wheezes. He has no rhonchi. He has no rales.    Abdominal: Soft. Normal appearance and bowel sounds are normal. He exhibits no distension. There is no tenderness. There is no rebound and no CVA tenderness.  Musculoskeletal: Normal range of motion. He exhibits no edema and no tenderness.  Neurological: He is alert and oriented to person, place, and time. He has normal strength. No cranial nerve deficit or sensory deficit. GCS eye subscore is 4. GCS verbal subscore is 5. GCS motor subscore is 6.  Skin: Skin is warm and dry. No abrasion and no rash noted.  Psychiatric: He has a normal mood and affect. His speech is normal and behavior is normal.    ED Course  Procedures (including critical care time) Labs Review Labs Reviewed - No data to display  Imaging Review No results found.   EKG Interpretation None      MDM   Final diagnoses:  None    The patient's skin is likely eroded to expose the patient's port. No active signs of infection at this time. He is afebrile. He will followup at the [facility]. His port is currently not being used    Leota Jacobsen, MD 05/27/14 [phone]

## 2014-05-28 ENCOUNTER — Other Ambulatory Visit (HOSPITAL_COMMUNITY): Payer: Self-pay | Admitting: Diagnostic Radiology

## 2014-05-28 ENCOUNTER — Encounter (HOSPITAL_COMMUNITY): Payer: Self-pay | Admitting: Pharmacy Technician

## 2014-05-28 ENCOUNTER — Other Ambulatory Visit (HOSPITAL_COMMUNITY): Payer: Self-pay | Admitting: Interventional Radiology

## 2014-05-28 DIAGNOSIS — C6290 Malignant neoplasm of unspecified testis, unspecified whether descended or undescended: Secondary | ICD-10-CM

## 2014-05-29 ENCOUNTER — Other Ambulatory Visit (HOSPITAL_COMMUNITY): Payer: Self-pay

## 2014-05-31 ENCOUNTER — Other Ambulatory Visit: Payer: Self-pay | Admitting: Radiology

## 2014-06-01 ENCOUNTER — Other Ambulatory Visit: Payer: Self-pay | Admitting: Radiology

## 2014-06-02 ENCOUNTER — Encounter (HOSPITAL_COMMUNITY): Payer: Self-pay | Admitting: Lab

## 2014-06-02 ENCOUNTER — Ambulatory Visit (HOSPITAL_COMMUNITY)
Admission: RE | Admit: 2014-06-02 | Discharge: 2014-06-02 | Disposition: A | Discharge status: Home / Self Care | Payer: Medicaid Other | Source: Ambulatory Visit | Attending: Diagnostic Radiology | Admitting: Diagnostic Radiology

## 2014-06-02 ENCOUNTER — Encounter (HOSPITAL_COMMUNITY): Payer: Self-pay

## 2014-06-02 DIAGNOSIS — Z452 Encounter for adjustment and management of vascular access device: Secondary | ICD-10-CM | POA: Insufficient documentation

## 2014-06-02 DIAGNOSIS — F411 Generalized anxiety disorder: Secondary | ICD-10-CM | POA: Insufficient documentation

## 2014-06-02 DIAGNOSIS — C6290 Malignant neoplasm of unspecified testis, unspecified whether descended or undescended: Secondary | ICD-10-CM

## 2014-06-02 LAB — CBC WITH DIFFERENTIAL/PLATELET
Basophils Absolute: 0 10*3/uL (ref 0.0–0.1)
Basophils Relative: 0 % (ref 0–1)
Eosinophils Absolute: 0.1 10*3/uL (ref 0.0–0.7)
Eosinophils Relative: 1 % (ref 0–5)
HEMATOCRIT: 42.5 % (ref 39.0–52.0)
Hemoglobin: 14.7 g/dL (ref 13.0–17.0)
LYMPHS ABS: 1.4 10*3/uL (ref 0.7–4.0)
Lymphocytes Relative: 25 % (ref 12–46)
MCH: 29.8 pg (ref 26.0–34.0)
MCHC: 34.6 g/dL (ref 30.0–36.0)
MCV: 86 fL (ref 78.0–100.0)
MONO ABS: 0.6 10*3/uL (ref 0.1–1.0)
MONOS PCT: 11 % (ref 3–12)
NEUTROS PCT: 63 % (ref 43–77)
Neutro Abs: 3.6 10*3/uL (ref 1.7–7.7)
Platelets: 196 10*3/uL (ref 150–400)
RBC: 4.94 MIL/uL (ref 4.22–5.81)
RDW: 12.1 % (ref 11.5–15.5)
WBC: 5.6 10*3/uL (ref 4.0–10.5)

## 2014-06-02 LAB — PROTIME-INR
INR: 1.05 (ref 0.00–1.49)
Prothrombin Time: 13.7 seconds (ref 11.6–15.2)

## 2014-06-02 LAB — APTT: aPTT: 32 seconds (ref 24–37)

## 2014-06-02 MED ORDER — MIDAZOLAM HCL 2 MG/2ML IJ SOLN
INTRAMUSCULAR | Status: AC | PRN
  Administered 2014-06-02: 2 mg via INTRAVENOUS

## 2014-06-02 MED ORDER — VANCOMYCIN HCL IN DEXTROSE 1-5 GM/200ML-% IV SOLN
1000.0000 mg | INTRAVENOUS | Status: AC
Start: 2014-06-02 — End: 2014-06-02
  Administered 2014-06-02: 1000 mg via INTRAVENOUS
  Filled 2014-06-02: qty 200

## 2014-06-02 MED ORDER — MIDAZOLAM HCL 2 MG/2ML IJ SOLN
INTRAMUSCULAR | Status: AC
  Filled 2014-06-02: qty 2

## 2014-06-02 MED ORDER — FENTANYL CITRATE 0.05 MG/ML IJ SOLN
INTRAMUSCULAR | Status: AC | PRN
  Administered 2014-06-02: 100 ug via INTRAVENOUS

## 2014-06-02 MED ORDER — SODIUM CHLORIDE 0.9 % IV SOLN
INTRAVENOUS | Status: DC
  Administered 2014-06-02: 14:00:00 via INTRAVENOUS

## 2014-06-02 MED ORDER — FENTANYL CITRATE 0.05 MG/ML IJ SOLN
INTRAMUSCULAR | Status: AC
  Filled 2014-06-02: qty 2

## 2014-06-02 NOTE — Discharge Instructions (Signed)
Conscious Sedation, Adult, Care After Refer to this sheet in the next few weeks. These instructions provide you with information on caring for yourself after your procedure. Your health care provider may also give you more specific instructions. Your treatment has been planned according to current medical practices, but problems sometimes occur. Call your health care provider if you have any problems or questions after your procedure. WHAT TO EXPECT AFTER THE PROCEDURE  After your procedure:  You may feel sleepy, clumsy, and have poor balance for several hours.  Vomiting may occur if you eat too soon after the procedure. HOME CARE INSTRUCTIONS  Do not participate in any activities where you could become injured for at least 24 hours. Do not:  Drive.  Swim.  Ride a bicycle.  Operate heavy machinery.  Cook.  Use power tools.  Climb ladders.  Work from a high place.  Do not make important decisions or sign legal documents until you are improved.  If you vomit, drink water, juice, or soup when you can drink without vomiting. Make sure you have little or no nausea before eating solid foods.  Only take over-the-counter or prescription medicines for pain, discomfort, or fever as directed by your health care provider.  Make sure you and your family fully understand everything about the medicines given to you, including what side effects may occur.  You should not drink alcohol, take sleeping pills, or take medicines that cause drowsiness for at least 24 hours.  If you smoke, do not smoke without supervision.  If you are feeling better, you may resume normal activities 24 hours after you were sedated.  Keep all appointments with your health care provider. SEEK MEDICAL CARE IF:  Your skin is pale or bluish in color.  You continue to feel nauseous or vomit.  Your pain is getting worse and is not helped by medicine.  You have bleeding or swelling.  You are still sleepy or  feeling clumsy after 24 hours. SEEK IMMEDIATE MEDICAL CARE IF:  You develop a rash.  You have difficulty breathing.  You develop any type of allergic problem.  You have a fever. MAKE SURE YOU:  Understand these instructions.  Will watch your condition.  Will get help right away if you are not doing well or get worse. Document Released: 08/27/2013 Document Reviewed: 08/27/2013 [facility] Patient Information 2015 [place], [place]. This information is not intended to replace advice given to you by your health care provider. Make sure you discuss any questions you have with your health care provider.  Incision and Drainage Care After Refer to this sheet in the next few weeks. These instructions provide you with information on caring for yourself after your procedure. Your caregiver may also give you more specific instructions. Your treatment has been planned according to current medical practices, but problems sometimes occur. Call your caregiver if you have any problems or questions after your procedure. HOME CARE INSTRUCTIONS   If antibiotic medicine is given, take it as directed. Finish it even if you start to feel better.  Only take over-the-counter or prescription medicines for pain, discomfort, or fever as directed by your caregiver.  Keep all follow-up appointments as directed by your caregiver.  Change any bandages (dressings) as directed by your caregiver. Replace old dressings with clean dressings.  Wash your hands before and after caring for your wound. You will receive specific instructions for cleansing and caring for your wound.  SEEK MEDICAL CARE IF:   You have increased pain, swelling,  or redness around the wound.  You have increased drainage, smell, or bleeding from the wound.  You have muscle aches, chills, or you feel generally sick.  You have a fever. MAKE SURE YOU:   Understand these instructions.  Will watch your condition.  Will get help right away if  you are not doing well or get worse. Document Released: 01/29/2012 Document Reviewed: 01/29/2012 [facility] Patient Information 2015 [place]. This information is not intended to replace advice given to you by your health care provider. Make sure you discuss any questions you have with your health care provider.

## 2014-06-02 NOTE — Progress Notes (Signed)
Called and faxed information to [facility] for daily dressing changes.

## 2014-06-02 NOTE — H&P (Signed)
Devon Bailey is an 23 y.o. male.   Chief Complaint: Pt with hx testicular cancer dx 2014 Port a cath was placed in IR 10/2013 Was used and did well for months Last chemo 01/2014 Has been flushing and doing well Last flush 04/28/2014 At that time did notice redness superior to PAC- no actual infection Developed into a "pimple" at same site Broke open just 3 weeks ago and now skin has eroded to be able to actually see purple tubing of PAC Scheduled now for removal  HPI: anxiety; mentally handicapped slightly  Past Medical History  Diagnosis Date  . Anxiety   . Seasonal allergies   . Mentally challenged     mother states mentally "slow"  . Autistic disorder     Past Surgical History  Procedure Laterality Date  . Orchiectomy Left 08/07/2013    Procedure: RADICAL ORCHIECTOMY;  Surgeon: Marissa Nestle, MD;  Location: [facility];  Service: Urology;  Laterality: Left;  . Radical orchiectomy Left 08/07/2013  . Portacath placement Right     Family History  Problem Relation Age of Onset  . Cancer Maternal Aunt   . Cancer Maternal Aunt    Social History:  reports that he has never smoked. He has never used smokeless tobacco. He reports that he does not drink alcohol or use illicit drugs.  Allergies:  Allergies  Allergen Reactions  . Penicillins Hives and Nausea And Vomiting     (Not in a hospital admission)  No results found for this or any previous visit (from the past 48 hour(s)). No results found.  Review of Systems  Constitutional: Negative for fever and weight loss.  Respiratory: Negative for cough and shortness of breath.   Cardiovascular: Positive for chest pain.       PAC site - tender  Gastrointestinal: Negative for nausea, vomiting and abdominal pain.  Musculoskeletal: Negative for back pain.  Neurological: Negative for weakness and headaches.  Psychiatric/Behavioral: Negative for substance abuse.    Blood pressure 131/86, pulse 84, temperature 98.1 F (36.7  C), temperature source Oral, resp. rate 18, height 5\' 10"  (1.778 m), weight 98.204 kg (216 lb 8 oz), SpO2 99.00%. Physical Exam  Constitutional: He is oriented to person, place, and time. He appears well-nourished.  Cardiovascular: Normal rate and regular rhythm.   No murmur heard. Respiratory: Effort normal and breath sounds normal. He has no wheezes.  GI: Soft. Bowel sounds are normal. There is no tenderness.  Musculoskeletal: Normal range of motion.  Neurological: He is alert and oriented to person, place, and time.  Skin: Skin is warm and dry.  PAC site- Rt chest: skin has eroded --can actually visualize purple tubing of PAC ---  Psychiatric: He has a normal mood and affect. His behavior is normal. Judgment and thought content normal.     Assessment/Plan PAC placed 10/2013 for chemo to treat testicular cancer Now PAC has become infected and healed but eroded skin at site Scheduled now for removal Pt and family aware of procedure benefits and risks and agreeable to proceed Consent signed and in chart  [place] A 06/02/2014, 1:27 PM

## 2014-06-02 NOTE — Procedures (Signed)
The port was exposed but did not appear to be frankly infected.  The right chest was prepped and skin was number with lidocaine 1%.  Small incision was made where the port was already exposed and the port was easily removed.  The pocket was irrigated with sterile saline.  Iodoform gauze was placed in the pocket and the covered with sterile gauze.  No immediate complication.  Will need to set up home health for daily dressing changes until the wound heals.

## 2014-06-03 ENCOUNTER — Other Ambulatory Visit (HOSPITAL_COMMUNITY): Payer: Self-pay | Admitting: Diagnostic Radiology

## 2014-06-03 DIAGNOSIS — C6290 Malignant neoplasm of unspecified testis, unspecified whether descended or undescended: Secondary | ICD-10-CM

## 2014-06-04 ENCOUNTER — Telehealth: Payer: Self-pay | Admitting: Radiology

## 2014-06-04 NOTE — Progress Notes (Signed)
I have called the patient today to follow up with him regarding recent port removal and gauze packing. I have spoke to a family member today who states the patient is doing well without fevers and c/o minimal pain only with movement. He states home health did arrive yesterday for gauze changes and that they stated the site looked like it was healing well. I gave him appointment information for his follow-up. The patient is to present to [facility] on Tuesday, 06/09/14 at 3pm for evaluation of site.  Tsosie Billing PA-C Interventional Radiology  06/04/2014 1:01 PM

## 2014-06-09 ENCOUNTER — Ambulatory Visit (HOSPITAL_COMMUNITY)
Admission: RE | Admit: 2014-06-09 | Discharge: 2014-06-09 | Disposition: A | Discharge status: Home / Self Care | Payer: Self-pay | Source: Ambulatory Visit | Attending: Diagnostic Radiology | Admitting: Diagnostic Radiology

## 2014-06-09 ENCOUNTER — Encounter (HOSPITAL_BASED_OUTPATIENT_CLINIC_OR_DEPARTMENT_OTHER): Payer: Medicaid Other

## 2014-06-09 ENCOUNTER — Encounter: Payer: Self-pay | Admitting: Diagnostic Radiology

## 2014-06-09 ENCOUNTER — Encounter (HOSPITAL_COMMUNITY): Payer: Self-pay

## 2014-06-09 ENCOUNTER — Encounter (HOSPITAL_COMMUNITY): Payer: Medicaid Other | Attending: Hematology and Oncology

## 2014-06-09 ENCOUNTER — Telehealth (HOSPITAL_COMMUNITY): Payer: Self-pay | Admitting: Emergency Medicine

## 2014-06-09 VITALS — BP 125/75 | HR 104 | Temp 98.4°F | Resp 16 | Wt 218.0 lb

## 2014-06-09 DIAGNOSIS — C6290 Malignant neoplasm of unspecified testis, unspecified whether descended or undescended: Secondary | ICD-10-CM

## 2014-06-09 DIAGNOSIS — C781 Secondary malignant neoplasm of mediastinum: Secondary | ICD-10-CM | POA: Diagnosis present

## 2014-06-09 LAB — COMPREHENSIVE METABOLIC PANEL
ALK PHOS: 82 U/L (ref 39–117)
ALT: 34 U/L (ref 0–53)
AST: 21 U/L (ref 0–37)
Albumin: 4.3 g/dL (ref 3.5–5.2)
Anion gap: 9 (ref 5–15)
BILIRUBIN TOTAL: 0.3 mg/dL (ref 0.3–1.2)
BUN: 15 mg/dL (ref 6–23)
CHLORIDE: 102 meq/L (ref 96–112)
CO2: 29 meq/L (ref 19–32)
Calcium: 9.4 mg/dL (ref 8.4–10.5)
Creatinine, Ser: 1.01 mg/dL (ref 0.50–1.35)
GFR calc Af Amer: >90 mL/min (ref >90)
GFR calc non Af Amer: >90 mL/min (ref >90)
GLUCOSE: 115 mg/dL — AB (ref 70–99)
POTASSIUM: 4.1 meq/L (ref 3.7–5.3)
Sodium: 140 mEq/L (ref 137–147)
Total Protein: 7.5 g/dL (ref 6.0–8.3)

## 2014-06-09 LAB — CBC WITH DIFFERENTIAL/PLATELET
Basophils Absolute: 0 10*3/uL (ref 0.0–0.1)
Basophils Relative: 0 % (ref 0–1)
Eosinophils Absolute: 0.1 10*3/uL (ref 0.0–0.7)
Eosinophils Relative: 2 % (ref 0–5)
HCT: 42.7 % (ref 39.0–52.0)
Hemoglobin: 14.4 g/dL (ref 13.0–17.0)
LYMPHS ABS: 1.4 10*3/uL (ref 0.7–4.0)
LYMPHS PCT: 30 % (ref 12–46)
MCH: 29.2 pg (ref 26.0–34.0)
MCHC: 33.7 g/dL (ref 30.0–36.0)
MCV: 86.6 fL (ref 78.0–100.0)
MONOS PCT: 9 % (ref 3–12)
Monocytes Absolute: 0.4 10*3/uL (ref 0.1–1.0)
NEUTROS ABS: 2.7 10*3/uL (ref 1.7–7.7)
NEUTROS PCT: 59 % (ref 43–77)
Platelets: 187 10*3/uL (ref 150–400)
RBC: 4.93 MIL/uL (ref 4.22–5.81)
RDW: 12.2 % (ref 11.5–15.5)
WBC: 4.5 10*3/uL (ref 4.0–10.5)

## 2014-06-09 LAB — LACTATE DEHYDROGENASE: LDH: 139 U/L (ref 94–250)

## 2014-06-09 LAB — AFP TUMOR MARKER: AFP-Tumor Marker: 5.3 ng/mL (ref 0.0–8.0)

## 2014-06-09 NOTE — Telephone Encounter (Signed)
Message copied by Elenor Legato on Tue Jun 09, 2014  4:41 PM ------      Message from: Baird Cancer      Created: Tue Jun 09, 2014  3:54 PM       WNL ------

## 2014-06-09 NOTE — Patient Instructions (Signed)
[facility] Discharge Instructions  RECOMMENDATIONS MADE BY THE CONSULTANT AND ANY TEST RESULTS WILL BE SENT TO YOUR REFERRING PHYSICIAN.  EXAM FINDINGS BY THE PHYSICIAN TODAY AND SIGNS OR SYMPTOMS TO REPORT TO CLINIC OR PRIMARY PHYSICIAN: You seen Dr Bubba Hales today.  Scheduled a chest/abd/pelvis CT scan, follow-up in 2 weeks with the doctor, increase fluid intake about a day before the CT scan  Thank you for choosing [facility] to provide your oncology and hematology care.  To afford each patient quality time with our providers, please arrive at least 15 minutes before your scheduled appointment time.  With your help, our goal is to use those 15 minutes to complete the necessary work-up to ensure our physicians have the information they need to help with your evaluation and healthcare recommendations.    Effective January 1st, 2014, we ask that you re-schedule your appointment with our physicians should you arrive 10 or more minutes late for your appointment.  We strive to give you quality time with our providers, and arriving late affects you and other patients whose appointments are after yours.    Again, thank you for choosing [facility].  Our hope is that these requests will decrease the amount of time that you wait before being seen by our physicians.       _____________________________________________________________  Should you have questions after your visit to [facility], please contact our office at [phone] between the hours of 8:30 a.m. and 5:00 p.m.  Voicemails left after 4:30 p.m. will not be returned until the following business day.  For prescription refill requests, have your pharmacy contact our office with your prescription refill request.

## 2014-06-09 NOTE — Progress Notes (Signed)
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 His tumor markers were elevated with an AFP of greater than 7000 and AFP of 75. These were normal when last obtained in March. He received 2 cycles of BEP and 2 cycles of EP due to DLCO abnormalities. He is currently asymptomatic from a pulmonary standpoint.  Port-A-cath was removed earlier this month after the device was starting to erode through the overlying skin. The wound is healing by secondary intention. He is undergoing regular dressing changes. He will see Dr. Anselm Pancoast, his surgeon, later this month.    MEDICAL HISTORY:  Past Medical History  Diagnosis Date  . Anxiety   . Seasonal allergies   . Mentally challenged     mother states mentally "slow"  . 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    ALLERGIES:  is allergic to penicillins.  MEDICATIONS: has a current medication list which includes the following prescription(s): acetaminophen, and the following Facility-Administered Medications: lorazepam and sodium chloride. All medications have been discontinued except for tylenol  FAMILY HISTORY: family history includes Cancer in his maternal aunt and maternal aunt.  REVIEW OF SYSTEMS:    SINCE YOUR LAST VISIT Been diagnosed  or treated for a new medical /surgical  problem or condition: No Any Recent Xrays or studies performed: No Any new prescription or OTC medications: No ECOG Perf Status: Fully active, able to carry on all pre-disease performance without restriction Problems sleeping: No Medications taken to help sleep: No How is your appetie: 100% normal Any Supplements: No Any trouble chewing or swallowing: No Any Nausea or Vomiting: No Any Bowel problems: No # Bowel Movements per week: 7 Any Urinary Issues: No Any Cardiac Problems: No Any Respiratory Issues: No Any Neurological Issues: No Do you live alone: No Feelings hopelessness: No You or your family have any concerns or Health changes: No Pain Assessment Pain Score: 1  Pain Location:  (incision from port-a-cath removal)  Other than that discussed above is noncontributory.    PHYSICAL EXAMINATION:   weight is 218 lb (98.884 kg). His oral temperature is 98.4 F (36.9 C). His blood pressure is 125/75 and his pulse is 104. His respiration is 16 and oxygen saturation is 100%.    GENERAL: alert, no distress and comfortable SKIN: skin color, texture, turgor are normal, no rashes. There is a dressing over the port site.No cyanosis of digits and no clubbing.  EYES: PERLA; Conjunctiva are pink and non-injected, sclera clear OROPHARYNX: no exudate, no erythema on lips, buccal mucosa, or tongue. NECK: supple, thyroid normal size, non-tender, without nodularity. No masses LYMPH:  no palpable lymphadenopathy in the cervical, axillary or inguinal LUNGS: clear to auscultation and percussion with normal breathing effort, no rales, ronchi, or wheezes present   HEART: regular rate & rhythm and no murmurs.  ABDOMEN: abdomen soft, non-tender  and normal bowel sounds, no masses palpated. MUSCULOSKELETAL/EXTREMITIES: Range of motion normal. No spine or CVA tenderness NEURO: alert & oriented x 3 with fluent speech, no focal motor/sensory deficits with decreased  reflexes.    LABORATORY DATA: Lab on 06/09/2014  Component Date Value Ref Range Status  . WBC 06/09/2014 4.5  4.0 - 10.5 K/uL Final  . RBC 06/09/2014 4.93  4.22 - 5.81 MIL/uL Final  . Hemoglobin 06/09/2014 14.4  13.0 - 17.0 g/dL Final  . HCT 06/09/2014 42.7  39.0 - 52.0 % Final  . MCV 06/09/2014 86.6  78.0 - 100.0 fL Final  . MCH 06/09/2014 29.2  26.0 - 34.0 pg Final  . MCHC 06/09/2014 33.7  30.0 - 36.0 g/dL Final  . RDW 06/09/2014 12.2  11.5 - 15.5 % Final  . Platelets 06/09/2014 187  150 - 400 K/uL Final  . Neutrophils Relative % 06/09/2014 59  43 - 77 % Final  . Neutro Abs 06/09/2014 2.7  1.7 - 7.7 K/uL Final  . Lymphocytes Relative 06/09/2014 30  12 - 46 % Final  . Lymphs Abs 06/09/2014 1.4  0.7 - 4.0 K/uL Final  . Monocytes Relative 06/09/2014 9  3 - 12 % Final  . Monocytes Absolute 06/09/2014 0.4  0.1 - 1.0 K/uL Final  . Eosinophils Relative 06/09/2014 2  0 - 5 % Final  . Eosinophils Absolute 06/09/2014 0.1  0.0 - 0.7 K/uL Final  . Basophils Relative 06/09/2014 0  0 - 1 % Final  . Basophils Absolute 06/09/2014 0.0  0.0 - 0.1 K/uL Final  . Sodium 06/09/2014 140  137 - 147 mEq/L Final  . Potassium 06/09/2014 4.1  3.7 - 5.3 mEq/L Final  . Chloride 06/09/2014 102  96 - 112 mEq/L Final  . CO2 06/09/2014 29  19 - 32 mEq/L Final  . Glucose, Bld 06/09/2014 115* 70 - 99 mg/dL Final  . BUN 06/09/2014 15  6 - 23 mg/dL Final  . Creatinine, Ser 06/09/2014 1.01  0.50 - 1.35 mg/dL Final  . Calcium 06/09/2014 9.4  8.4 - 10.5 mg/dL Final  . Total Protein 06/09/2014 7.5  6.0 - 8.3 g/dL Final  . Albumin 06/09/2014 4.3  3.5 - 5.2 g/dL Final  . AST 06/09/2014 21  0 - 37 U/L Final  . ALT 06/09/2014 34  0 - 53 U/L Final  . Alkaline Phosphatase 06/09/2014 82  39 - 117 U/L Final  . Total Bilirubin 06/09/2014 0.3  0.3 - 1.2 mg/dL Final  . GFR calc non Af Amer 06/09/2014 >90  >90 mL/min Final  . GFR calc Af Amer 06/09/2014 >90  >90 mL/min Final   Comment: (NOTE)                          The eGFR  has been calculated using the CKD EPI equation.                          This calculation has not been validated in all clinical situations.                          eGFR's persistently <90 mL/min signify possible Chronic Kidney                          Disease.  . Anion gap 06/09/2014 9  5 - 15 Final  . LDH  06/09/2014 139  94 - 250 U/L Final  Hospital Outpatient Visit on 06/02/2014  Component Date Value Ref Range Status  . WBC 06/02/2014 5.6  4.0 - 10.5 K/uL Final  . RBC 06/02/2014 4.94  4.22 - 5.81 MIL/uL Final  . Hemoglobin 06/02/2014 14.7  13.0 - 17.0 g/dL Final  . HCT 06/02/2014 42.5  39.0 - 52.0 % Final  . MCV 06/02/2014 86.0  78.0 - 100.0 fL Final  . MCH 06/02/2014 29.8  26.0 - 34.0 pg Final  . MCHC 06/02/2014 34.6  30.0 - 36.0 g/dL Final  . RDW 06/02/2014 12.1  11.5 - 15.5 % Final  . Platelets 06/02/2014 196  150 - 400 K/uL Final  . Neutrophils Relative % 06/02/2014 63  43 - 77 % Final  . Neutro Abs 06/02/2014 3.6  1.7 - 7.7 K/uL Final  . Lymphocytes Relative 06/02/2014 25  12 - 46 % Final  . Lymphs Abs 06/02/2014 1.4  0.7 - 4.0 K/uL Final  . Monocytes Relative 06/02/2014 11  3 - 12 % Final  . Monocytes Absolute 06/02/2014 0.6  0.1 - 1.0 K/uL Final  . Eosinophils Relative 06/02/2014 1  0 - 5 % Final  . Eosinophils Absolute 06/02/2014 0.1  0.0 - 0.7 K/uL Final  . Basophils Relative 06/02/2014 0  0 - 1 % Final  . Basophils Absolute 06/02/2014 0.0  0.0 - 0.1 K/uL Final  . Prothrombin Time 06/02/2014 13.7  11.6 - 15.2 seconds Final  . INR 06/02/2014 1.05  0.00 - 1.49 Final  . 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 PLEASE SEE ONCOLOGY TEMPLATE FOR DETAILS. Microscopic Comment ONCOLOGY TABLE - TESTIS 1. Specimen and laterality: Left testis. 2. Tumor focality: Unifocal. 3. Macroscopic extent of tumor: Confined within testis. 4. Maximum tumor size  (cm): 14 cm. 5. 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 6. Microscopic tumor extension: Limited to testis. 7. Spermatic cord and surgical margins: Negative. 8. Lymph-Vascular invasion: Present. 9. Intratubular germ cell neoplasia: N/A. 10. Lymph nodes: # examined: N/A; # positive: N/A 11. TNM code: pT2, pNX 12. Serum tumor markers: See patient's medical record 13. 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 Focal angiolymphatic invasion is present. Confirmatory immunostains were performed for CD30, beta hCG, AFP, CD117, PLAP, and Pan-Cytokeratin AE1/AE3 were performed and the immunostains highlight the different components of the tumor. Control stained appropriately. Dr Saralyn Pilar agrees. (HCL:ecj 08/12/2013) Aldona Bar MD Pathologist, Electronic Signature (Case signed 08/12/2013) Specimen Gross and Clinical Information Specimen(s) Obtained: Testis, tumor, left     RADIOGRAPHIC STUDIES: Ir Removal Tun Access W/ Port W/o Fl Mod Sed  06/02/2014   CLINICAL DATA:  23 year old with testicular cancer. The patient has a right chest Port-A-Cath which is no longer needed. Recently, the skin above the Port-A-Cath has eroded and now the Port-A-Cath is exposed.  EXAM: REMOVAL OF SUBCUTANEOUS PORT-A-CATH  Physician: Stephan Minister. Anselm Pancoast, MD  FLUOROSCOPY TIME:  None  MEDICATIONS: Vancomycin 1 g. 2 mg versed, 100 mcg fentanyl. A radiology nurse monitored the patient for moderate sedation.  ANESTHESIA/SEDATION: Moderate sedation time: 25 min  PROCEDURE: Informed consent was obtained for removal of the Port-A-Cath. Examination of the chest demonstrated exposure of the  Port-A-Cath. The right side of the chest and port area was prepped with Betadine. Maximal barrier sterile technique was utilized including caps, mask, sterile gowns,  sterile gloves, sterile drape, hand hygiene and skin antiseptic. The skin around the port was anesthetized with 1% lidocaine. A small incision was made over the port to complete the exposure of the port. Port was easily removed with minimal blunt dissection of the soft tissues. The entire port device was removed. The port pocket was irrigated with sterile saline. Subcutaneous tissue appeared to be healthy. However, the skin tissue was irregular with some purple discoloration. Because the port was exposed, the pocket was thought to be high risk for infection. As result, Iodoform gauze was placed within the subcutaneous pocket and a sterile gauze placed over the incision.  FINDINGS: A small area of the port was exposed, including one of the palpable bumpers. Small area of skin around the Port-A-Cath was discolored. There was no purulent drainage. The subcutaneous tissues appear to be healthy after removal of the Port-A-Cath.  COMPLICATIONS: None  IMPRESSION: Complete removal of the right chest Port-A-Cath.  Plan to close the wound with secondary intention due to the infection risk from the exposed Port-A-Cath. Will make arrangements for home health and regular dressing changes.   Electronically Signed   By: Markus Daft M.D.   On: 06/02/2014 17:35   CLINICAL DATA: Testicular carcinoma.  EXAM:  CT CHEST, ABDOMEN, AND PELVIS WITH CONTRAST  TECHNIQUE:  Multidetector CT imaging of the chest, abdomen and pelvis was  performed following the standard protocol during bolus  administration of intravenous contrast.  CONTRAST: 185m OMNIPAQUE IOHEXOL 300 MG/ML SOLN  FINDINGS:  CT CHEST FINDINGS  The chest wall is unremarkable and stable. A right-sided Port-A-Cath  is noted. No supraclavicular or axillary mass or adenopathy. The  bony thorax is intact. No destructive bone lesions or spinal canal  compromise.  The heart is normal in size. No pericardial effusion. There is a  small amount of fluid in the pericardial  recesses. The aorta is  normal in caliber. No dissection. The esophagus is normal. The prior  PET-CT showed bulky mediastinal and left hilar adenopathy and  pulmonary metastatic disease. There are a few scattered small  residual mediastinal and hilar lymph nodes but no overt adenopathy.  The sub carinal mass previously measured 3.9 x 3.4 cm and now  measures a maximum of 2.5 x 1.5 cm. There is a lobulated  low-attenuation lesion in the left lower lobe which measures 6.0 x  2.3 cm. This is adjacent to a cavitary area in the left lower lobe.  This is most likely treated/necrotic tumor. The pulmonary nodules  have resolved. No pleural effusion.  CT ABDOMEN AND PELVIS FINDINGS  The solid abdominal organs are unremarkable and stable. There is a  stable splenic lesion which is most likely a cyst. No mesenteric or  retroperitoneal mass or adenopathy. There is a small stable lymph  node near the medial limb of the left adrenal gland which is  unchanged. The aorta is normal in caliber. The major branch vessels  are normal. The major venous structures are patent. The stomach,  duodenum, small bowel and colon are unremarkable and stable. No  pelvic mass or adenopathy. No free pelvic fluid collections. No  inguinal mass or adenopathy.  The bony structures are unremarkable.  IMPRESSION:  1. Small residual lymph nodes noted in the mediastinal and hilar  regions. Findings suggest an excellent response to therapy. There is  a complex left lower lobe lung lesion which is largely cystic with a  cavitary component. This is most likely treated/necrotic tumor.  Continued surveillance is suggested.  2. Resolution of numerous small pulmonary nodules seen on the prior  study.  3. No findings for metastatic disease or adenopathy in the  abdomen/pelvis.  Electronically Signed  By: Kalman Jewels M.D.  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 Normal markers after chemotherapy. Residual subcarinal and left lower lobe necrotic lesion on prior CT scan otherwise pulmonary nodules and lymphadenopathy have resolved.           RECOMMENDATIONS:   Schedule patient for restaging CT scans. Tumor markers are being repeated. Consider PET scan if abnormalities persist or new findings seen.  Return appointment to the cancer clinic in 2 weeks.        Darrall Dears, MD 06/09/2014 12:43 PM

## 2014-06-09 NOTE — Progress Notes (Signed)
LABS DRAWN FOR CBCD,CMP,AFPTM,BHCGTM,LDH

## 2014-06-10 NOTE — Progress Notes (Signed)
Patient ID: Devon Bailey, male   DOB: [birth year], 23 y.o.   MRN: [id number] Mr. Bogue followed up in Interventional Radiology.  Right chest portacath removed last week and the pocket was left open and packed because the port had been exposed.  The packing is being changed every day with wet to dry dressings.  Home health nursing is checking the wound every 3 days.  Patient and family have no complaints or problems.    Removed the bandage.  The pocket has significantly decreased in size and the skin edges appear more healthy than when the port was removed.  The pocket tissue appears healthy.  New gauze was placed.  Continue daily dressing changes until wound completely heals.  Patient will follow up as needed.

## 2014-06-12 LAB — BETA HCG QUANT (REF LAB): Beta hCG, Tumor Marker: <2 m[IU]/mL (ref <5.0)

## 2014-06-15 ENCOUNTER — Ambulatory Visit (HOSPITAL_COMMUNITY)
Admission: RE | Admit: 2014-06-15 | Discharge: 2014-06-15 | Disposition: A | Discharge status: Home / Self Care | Payer: Medicaid Other | Source: Ambulatory Visit | Attending: Oncology | Admitting: Oncology

## 2014-06-15 ENCOUNTER — Encounter (HOSPITAL_COMMUNITY): Payer: Self-pay

## 2014-06-15 DIAGNOSIS — C6290 Malignant neoplasm of unspecified testis, unspecified whether descended or undescended: Secondary | ICD-10-CM

## 2014-06-15 DIAGNOSIS — J984 Other disorders of lung: Secondary | ICD-10-CM | POA: Insufficient documentation

## 2014-06-15 DIAGNOSIS — C781 Secondary malignant neoplasm of mediastinum: Secondary | ICD-10-CM

## 2014-06-15 HISTORY — DX: Malignant (primary) neoplasm, unspecified: C80.1

## 2014-06-15 MED ORDER — IOHEXOL 300 MG/ML  SOLN
100.0000 mL | Freq: Once | INTRAMUSCULAR | Status: AC | PRN
  Administered 2014-06-15: 100 mL via INTRAVENOUS

## 2014-06-25 ENCOUNTER — Ambulatory Visit (HOSPITAL_COMMUNITY): Payer: Self-pay

## 2014-06-26 NOTE — Progress Notes (Signed)
This encounter was created in error - please disregard.

## 2014-07-03 ENCOUNTER — Encounter (HOSPITAL_COMMUNITY): Payer: Self-pay | Attending: Hematology and Oncology

## 2014-07-03 ENCOUNTER — Encounter (HOSPITAL_COMMUNITY): Payer: Self-pay

## 2014-07-03 VITALS — BP 139/76 | HR 72 | Temp 98.5°F | Resp 18 | Wt 218.0 lb

## 2014-07-03 DIAGNOSIS — C781 Secondary malignant neoplasm of mediastinum: Secondary | ICD-10-CM | POA: Insufficient documentation

## 2014-07-03 DIAGNOSIS — C6292 Malignant neoplasm of left testis, unspecified whether descended or undescended: Secondary | ICD-10-CM

## 2014-07-03 DIAGNOSIS — J302 Other seasonal allergic rhinitis: Secondary | ICD-10-CM

## 2014-07-03 DIAGNOSIS — F79 Unspecified intellectual disabilities: Secondary | ICD-10-CM

## 2014-07-03 DIAGNOSIS — C6290 Malignant neoplasm of unspecified testis, unspecified whether descended or undescended: Secondary | ICD-10-CM | POA: Insufficient documentation

## 2014-07-03 DIAGNOSIS — J3089 Other allergic rhinitis: Secondary | ICD-10-CM

## 2014-07-03 NOTE — Patient Instructions (Signed)
..  [facility] Discharge Instructions  RECOMMENDATIONS MADE BY THE CONSULTANT AND ANY TEST RESULTS WILL BE SENT TO YOUR REFERRING PHYSICIAN.  EXAM FINDINGS BY THE PHYSICIAN TODAY AND SIGNS OR SYMPTOMS TO REPORT TO CLINIC OR PRIMARY PHYSICIAN: Exam and findings as discussed by Dr. Barnet Glasgow.  Notify us of new lumps, shortness of breath out of the ordinary, or new cough  INSTRUCTIONS/FOLLOW-UP: 3 months  Thank you for choosing [facility] to provide your oncology and hematology care.  To afford each patient quality time with our providers, please arrive at least 15 minutes before your scheduled appointment time.  With your help, our goal is to use those 15 minutes to complete the necessary work-up to ensure our physicians have the information they need to help with your evaluation and healthcare recommendations.    Effective January 1st, 2014, we ask that you re-schedule your appointment with our physicians should you arrive 10 or more minutes late for your appointment.  We strive to give you quality time with our providers, and arriving late affects you and other patients whose appointments are after yours.    Again, thank you for choosing [facility].  Our hope is that these requests will decrease the amount of time that you wait before being seen by our physicians.       _____________________________________________________________  Should you have questions after your visit to [facility], please contact our office at [phone] between the hours of 8:30 a.m. and 4:30 p.m.  Voicemails left after 4:30 p.m. will not be returned until the following business day.  For prescription refill requests, have your pharmacy contact our office with your prescription refill request.    _______________________________________________________________  We hope that we have given you very good care.  You may receive a patient satisfaction survey in  the mail, please complete it and return it as soon as possible.  We value your feedback!  _______________________________________________________________  Have you asked about our STAR program?  STAR stands for Survivorship Training and Rehabilitation, and this is a nationally recognized cancer care program that focuses on survivorship and rehabilitation.  Cancer and cancer treatments may cause problems, such as, pain, making you feel tired and keeping you from doing the things that you need or want to do. Cancer rehabilitation can help. Our goal is to reduce these troubling effects and help you have the best quality of life possible.  You may receive a survey from a nurse that asks questions about your current state of health.  Based on the survey results, all eligible patients will be referred to the [facility] program for an evaluation so we can better serve you!  A frequently asked questions sheet is available upon request.

## 2014-07-03 NOTE — Progress Notes (Signed)
[place] [facility]  OFFICE PROGRESS NOTE  Devon Berry, MD [address] [place] [address]  DIAGNOSIS: Cancer of testis, non-seminomatous, left  Other seasonal allergic rhinitis  Mental retardation  Chief Complaint  Patient presents with  . Follow-up  . 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  INTERVAL HISTORY: Devon Bailey 23 y.o. male returns for followup of stage IV nonseminomatous germ cell tumor, status post left radical orchiectomy followed by chemotherapy utilizing cisplatin, etoposide, and bleomycin with 2 cycles of BEP followed by 2 cycles of BEP due to abnormalities in DLCO  with last cycle of treatment beginning on 01/26/2014. Port-A-Cath was removed in July 2015 to 2 erosion through the overlying skin with wound healing by secondary intention utilizing regular dressing changes. Area of previous port placement is healing well with just a small area still not close. Appetite is excellent with no nasal symptoms, sore throat, cough, wheezing, shortness of breath on exertion, PND, orthopnea, palpitations, abnormalities on self testis exam, lower extremity swelling or redness, peripheral paresthesias, skin rash, headache, or seizures.  MEDICAL HISTORY: Past Medical History  Diagnosis Date  . Anxiety   . Seasonal allergies   . Mentally challenged     mother states mentally "slow"  . Autistic disorder   . 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    ALLERGIES:  is allergic to penicillins.  MEDICATIONS: has a current medication list which includes the following prescription(s): acetaminophen, and the following Facility-Administered  Medications: lorazepam and sodium chloride.  SURGICAL HISTORY:  Past Surgical History  Procedure Laterality Date  . Orchiectomy Left 08/07/2013    Procedure: RADICAL ORCHIECTOMY;  Surgeon: Marissa Nestle, MD;  Location: [facility];  Service: Urology;  Laterality: Left;  . Radical orchiectomy Left 08/07/2013  . Portacath placement Right   . Port-a-cath removal Right July 2015    FAMILY HISTORY: family history includes Cancer in his maternal aunt and maternal aunt.  SOCIAL HISTORY:  reports that he has never smoked. He has never used smokeless tobacco. He reports that he does not drink alcohol or use illicit drugs.  REVIEW OF SYSTEMS:  Other than that discussed above is noncontributory.  PHYSICAL EXAMINATION: ECOG PERFORMANCE STATUS: 0 - Asymptomatic  Blood pressure 139/76, pulse 72, temperature 98.5 F (36.9 C), temperature source Oral, resp. rate 18, weight 218 lb (98.884 kg).  GENERAL:alert, no distress and comfortable. Alopecia resolving. SKIN: skin color, texture, turgor are normal, no rashes or significant lesions EYES: PERLA; Conjunctiva are pink and non-injected, sclera clear SINUSES: No redness or tenderness over maxillary or ethmoid sinuses OROPHARYNX:no exudate, no erythema on lips, buccal mucosa, or tongue. NECK: supple, thyroid normal size, non-tender, without nodularity. No masses CHEST: Right chest wall light port site healing well. LYMPH:  no palpable lymphadenopathy in the cervical, axillary or inguinal LUNGS: clear to auscultation and percussion with normal breathing effort HEART: regular rate & rhythm and no murmurs. ABDOMEN:abdomen soft, non-tender and normal bowel sounds MUSCULOSKELETAL:no cyanosis of digits and no clubbing. Range of motion normal.  NEURO: alert & oriented x 3 with fluent speech, no focal motor/sensory deficits   LABORATORY DATA:Results for GAY, RAPE (MRN [id number]) as of 07/03/2014  08:41  Ref. Range 01/05/2014 09:32 01/26/2014 10:30  02/16/2014 09:47 04/27/2014 09:52 06/09/2014 09:34  Beta hCG, Tumor Marker Latest Range: < 5.0 mIU/mL 14.8 (H) 2.6 < 2.0 < 2.0 < 2.0   Results for TEDFORD, BERG (MRN [id number]) as of 07/03/2014 08:41  Ref. Range 01/05/2014 09:32 01/26/2014 10:30 02/16/2014 09:47 04/27/2014 09:52 06/09/2014 09:34  AFP-Tumor Marker Latest Range: 0.0-8.0 ng/mL 7.4 9.2 (H) 6.1 4.8 5.3      Lab on 06/09/2014  Component Date Value Ref Range Status  . WBC 06/09/2014 4.5  4.0 - 10.5 K/uL Final  . RBC 06/09/2014 4.93  4.22 - 5.81 MIL/uL Final  . Hemoglobin 06/09/2014 14.4  13.0 - 17.0 g/dL Final  . HCT 06/09/2014 42.7  39.0 - 52.0 % Final  . MCV 06/09/2014 86.6  78.0 - 100.0 fL Final  . MCH 06/09/2014 29.2  26.0 - 34.0 pg Final  . MCHC 06/09/2014 33.7  30.0 - 36.0 g/dL Final  . RDW 06/09/2014 12.2  11.5 - 15.5 % Final  . Platelets 06/09/2014 187  150 - 400 K/uL Final  . Neutrophils Relative % 06/09/2014 59  43 - 77 % Final  . Neutro Abs 06/09/2014 2.7  1.7 - 7.7 K/uL Final  . Lymphocytes Relative 06/09/2014 30  12 - 46 % Final  . Lymphs Abs 06/09/2014 1.4  0.7 - 4.0 K/uL Final  . Monocytes Relative 06/09/2014 9  3 - 12 % Final  . Monocytes Absolute 06/09/2014 0.4  0.1 - 1.0 K/uL Final  . Eosinophils Relative 06/09/2014 2  0 - 5 % Final  . Eosinophils Absolute 06/09/2014 0.1  0.0 - 0.7 K/uL Final  . Basophils Relative 06/09/2014 0  0 - 1 % Final  . Basophils Absolute 06/09/2014 0.0  0.0 - 0.1 K/uL Final  . Sodium 06/09/2014 140  137 - 147 mEq/L Final  . Potassium 06/09/2014 4.1  3.7 - 5.3 mEq/L Final  . Chloride 06/09/2014 102  96 - 112 mEq/L Final  . CO2 06/09/2014 29  19 - 32 mEq/L Final  . Glucose, Bld 06/09/2014 115* 70 - 99 mg/dL Final  . BUN 06/09/2014 15  6 - 23 mg/dL Final  . Creatinine, Ser 06/09/2014 1.01  0.50 - 1.35 mg/dL Final  . Calcium 06/09/2014 9.4  8.4 - 10.5 mg/dL Final  . Total Protein 06/09/2014 7.5  6.0 - 8.3 g/dL Final  . Albumin 06/09/2014 4.3  3.5 - 5.2 g/dL Final  . AST 06/09/2014 21   0 - 37 U/L Final  . ALT 06/09/2014 34  0 - 53 U/L Final  . Alkaline Phosphatase 06/09/2014 82  39 - 117 U/L Final  . Total Bilirubin 06/09/2014 0.3  0.3 - 1.2 mg/dL Final  . GFR calc non Af Amer 06/09/2014 >90  >90 mL/min Final  . GFR calc Af Amer 06/09/2014 >90  >90 mL/min Final   Comment: (NOTE)                          The eGFR has been calculated using the CKD EPI equation.                          This calculation has not been validated in all clinical situations.                          eGFR's persistently <90 mL/min signify possible Chronic Kidney  Disease.  . Anion gap 06/09/2014 9  5 - 15 Final  . AFP-Tumor Marker 06/09/2014 5.3  0.0 - 8.0 ng/mL Final   Comment: (NOTE)                          The [employer] Centaur AFP immunoassay method is used.  Results obtained                          with different assay methods or kits cannot be used interchangeably.                          AFP is a valuable aid in the management of nonseminomatous testicular                          cancer patients when used in conjunction with information available                          from the clinical evaluation and other diagnostic procedures.                          Increased serum AFP concentrations have also been observed in ataxia                          telangiectasia, hereditary tyrosinemia, primary hepatocellular                          carcinoma, teratocarcinoma, gastrointestinal tract cancers with and                          without liver metastases, and in benign hepatic conditions such as                          acute viral hepatitis, chronic active hepatitis, and cirrhosis.  This                          result cannot be interpreted as absolute evidence of the presence or                          absence of malignant disease.  This result is not interpretable in                          pregnant females.                          Performed at [facility]  . Beta hCG, Tumor Marker 06/09/2014 < 2.0  < 5.0 mIU/mL Final   Comment: (NOTE)                             Reference Range:                               Males: <5 mIU/mL  Females: Nonpregnant: <5 mIU/mL                           Values from different assay methods may vary. The use of this                           assay to monitor or to diagnose patients with cancer or any                           condition other than pregnancy has not been approved by the FDA                           of the manufacturer of the assay.                           This HCG assay is performed on the [employer]                           platform. Use of an alternative HCG assay provides corroboration                           potentially useful in distinguising true versus false elevations                           of HCG. To further rule out false elevations of HCG, also                           consider measurement of HCG after pretreatment for human                           anti-animal [mouse] antibody (HCG, Total, with HAMA treatment),                           order code 19720.                          Performed at [facility]  . LDH 06/09/2014 139  94 - 250 U/L Final    PATHOLOGY: No new pathology.  Urinalysis    Component Value Date/Time   COLORURINE YELLOW 07/05/2013 2240   APPEARANCEUR CLEAR 07/05/2013 2240   LABSPEC 1.025 07/05/2013 2240   PHURINE 8.5* 07/05/2013 2240   GLUCOSEU NEGATIVE 07/05/2013 2240   HGBUR NEGATIVE 07/05/2013 [place] 07/05/2013 2240   KETONESUR NEGATIVE 07/05/2013 2240   PROTEINUR NEGATIVE 07/05/2013 2240   UROBILINOGEN 0.2 07/05/2013 2240   NITRITE NEGATIVE 07/05/2013 2240   LEUKOCYTESUR NEGATIVE 07/05/2013 2240    RADIOGRAPHIC STUDIES: Ct Chest W Contrast   06/15/2014   CLINICAL DATA:  Restaging testicular cancer  EXAM: CT CHEST, ABDOMEN, AND PELVIS WITH CONTRAST  TECHNIQUE:  Multidetector CT imaging of the chest, abdomen and pelvis was performed following the standard protocol during bolus administration of intravenous contrast.  CONTRAST:  141m OMNIPAQUE IOHEXOL 300 MG/ML  SOLN  COMPARISON:  02/06/2014  FINDINGS: CT CHEST FINDINGS  There is no pleural effusion identified. No airspace consolidation identified. The cavitary lesion  within the left lower lobe measures 3.4 cm, image 40/series 2. Previously 4.9 cm. There is an adjacent bilobed lesion closer to the diaphragm which measures 4.5 x 1.7 cm, image 45/series 2. Previous 6.3 x 2.2 cm. There is no new suspicious nodule or mass. No enlarged mediastinal or hilar lymph nodes. No axillary or supraclavicular adenopathy. Review of the visualized bony structures is negative for aggressive lytic or sclerotic bone lesion.  CT ABDOMEN AND PELVIS FINDINGS  Bone there is no focal liver abnormality. The gallbladder is normal. No biliary dilatation. Low-attenuation structure within the spleen measures 2.3 cm, image 57/series 2 and is similar to previous exam.  The adrenal glands both appear normal. The kidneys are both on unremarkable. The urinary bladder appears normal. The prostate gland and seminal vesicles appear normal.  Normal caliber of the abdominal aorta. No aneurysm. No upper abdominal adenopathy. There is no pelvic or inguinal adenopathy identified.  The stomach appears normal. The small bowel loops have a normal course and caliber. No obstruction. The appendix is visualized and appears normal. The colon is on unremarkable.  There is no free fluid or fluid collections within the abdomen or pelvis. Review of the visualized osseous structures is negative. No aggressive lytic or sclerotic bone lesions.  IMPRESSION: 1. Pulmonary lesions within the left lower lobe have decreased in size from previous exam consistent with response to therapy. 2. No new or progressive disease identified.   Electronically Signed   By: Kerby Moors M.D.   On:  06/15/2014 13:49   Ct Abdomen Pelvis W Contrast  06/15/2014   CLINICAL DATA:  Restaging testicular cancer  EXAM: CT CHEST, ABDOMEN, AND PELVIS WITH CONTRAST  TECHNIQUE: Multidetector CT imaging of the chest, abdomen and pelvis was performed following the standard protocol during bolus administration of intravenous contrast.  CONTRAST:  159m OMNIPAQUE IOHEXOL 300 MG/ML  SOLN  COMPARISON:  02/06/2014  FINDINGS: CT CHEST FINDINGS  There is no pleural effusion identified. No airspace consolidation identified. The cavitary lesion within the left lower lobe measures 3.4 cm, image 40/series 2. Previously 4.9 cm. There is an adjacent bilobed lesion closer to the diaphragm which measures 4.5 x 1.7 cm, image 45/series 2. Previous 6.3 x 2.2 cm. There is no new suspicious nodule or mass. No enlarged mediastinal or hilar lymph nodes. No axillary or supraclavicular adenopathy. Review of the visualized bony structures is negative for aggressive lytic or sclerotic bone lesion.  CT ABDOMEN AND PELVIS FINDINGS  Bone there is no focal liver abnormality. The gallbladder is normal. No biliary dilatation. Low-attenuation structure within the spleen measures 2.3 cm, image 57/series 2 and is similar to previous exam.  The adrenal glands both appear normal. The kidneys are both on unremarkable. The urinary bladder appears normal. The prostate gland and seminal vesicles appear normal.  Normal caliber of the abdominal aorta. No aneurysm. No upper abdominal adenopathy. There is no pelvic or inguinal adenopathy identified.  The stomach appears normal. The small bowel loops have a normal course and caliber. No obstruction. The appendix is visualized and appears normal. The colon is on unremarkable.  There is no free fluid or fluid collections within the abdomen or pelvis. Review of the visualized osseous structures is negative. No aggressive lytic or sclerotic bone lesions.  IMPRESSION: 1. Pulmonary lesions within the left lower lobe have  decreased in size from previous exam consistent with response to therapy. 2. No new or progressive disease identified.   Electronically Signed   By: TKerby Moors  M.D.   On: 06/15/2014 13:49    ASSESSMENT:  #1. Nonseminomatous germ cell tumor of the testis, stage IV, status post 4 cycles of chemotherapy, improved CT scan, with normal tumor markers done 06/09/2014.  #2. Allergic rhinitis, worsening, refusing to use recommended antihistamines.  #3. Mild mental retardation.  #4. 78% predicted DLCO on pulmonary function tests. This compares to 73% predicted done on 11/06/2013 prior to treatment with improvement to 86% on 12/17/2013. It was presumed that the original baseline abnormality was due to mediastinal and lymphatic involvement with tumor but with subsequent drop back to 78% on 01/01/2014, bleomycin was discontinued and the last 2 cycles of chemotherapy given without it. DLCO has leveled off at 78% of predicted.      PLAN:  #1. Continue watchful expectation with followup in 3 months at which time CBC, chem profile, LDH, alpha-fetoprotein, and beta hCG will be done. In the presence of his mom, he was told to call should any new symptoms occur that are troublesome and/or persistent.   All questions were answered. The patient knows to call the clinic with any problems, questions or concerns. We can certainly see the patient much sooner if necessary.   I spent 30 minutes counseling the patient face to face. The total time spent in the appointment was 25 minutes.    Doroteo Bradford, MD 07/03/2014 10:19 AM  DISCLAIMER:  This note was dictated with voice recognition software.  Similar sounding words can inadvertently be transcribed inaccurately and may not be corrected upon review.

## 2014-10-01 ENCOUNTER — Encounter (HOSPITAL_COMMUNITY): Payer: Self-pay

## 2014-10-01 ENCOUNTER — Encounter (HOSPITAL_COMMUNITY): Payer: Self-pay | Attending: Hematology and Oncology

## 2014-10-01 VITALS — BP 116/75 | HR 88 | Temp 99.0°F | Resp 16 | Wt 216.4 lb

## 2014-10-01 DIAGNOSIS — C6292 Malignant neoplasm of left testis, unspecified whether descended or undescended: Secondary | ICD-10-CM

## 2014-10-01 DIAGNOSIS — J302 Other seasonal allergic rhinitis: Secondary | ICD-10-CM

## 2014-10-01 DIAGNOSIS — F79 Unspecified intellectual disabilities: Secondary | ICD-10-CM

## 2014-10-01 DIAGNOSIS — C6290 Malignant neoplasm of unspecified testis, unspecified whether descended or undescended: Secondary | ICD-10-CM | POA: Insufficient documentation

## 2014-10-01 LAB — CBC WITH DIFFERENTIAL/PLATELET
BASOS PCT: 0 % (ref 0–1)
Basophils Absolute: 0 10*3/uL (ref 0.0–0.1)
Eosinophils Absolute: 0.1 10*3/uL (ref 0.0–0.7)
Eosinophils Relative: 1 % (ref 0–5)
HCT: 42.3 % (ref 39.0–52.0)
Hemoglobin: 14.9 g/dL (ref 13.0–17.0)
LYMPHS PCT: 25 % (ref 12–46)
Lymphs Abs: 1.3 10*3/uL (ref 0.7–4.0)
MCH: 30.8 pg (ref 26.0–34.0)
MCHC: 35.2 g/dL (ref 30.0–36.0)
MCV: 87.6 fL (ref 78.0–100.0)
MONOS PCT: 11 % (ref 3–12)
Monocytes Absolute: 0.6 10*3/uL (ref 0.1–1.0)
NEUTROS ABS: 3.2 10*3/uL (ref 1.7–7.7)
Neutrophils Relative %: 63 % (ref 43–77)
Platelets: 189 10*3/uL (ref 150–400)
RBC: 4.83 MIL/uL (ref 4.22–5.81)
RDW: 12.3 % (ref 11.5–15.5)
WBC: 5.2 10*3/uL (ref 4.0–10.5)

## 2014-10-01 LAB — COMPREHENSIVE METABOLIC PANEL
ALT: 17 U/L (ref 0–53)
ANION GAP: 12 (ref 5–15)
AST: 15 U/L (ref 0–37)
Albumin: 4.5 g/dL (ref 3.5–5.2)
Alkaline Phosphatase: 86 U/L (ref 39–117)
BILIRUBIN TOTAL: 0.5 mg/dL (ref 0.3–1.2)
BUN: 15 mg/dL (ref 6–23)
CO2: 28 meq/L (ref 19–32)
CREATININE: 1.11 mg/dL (ref 0.50–1.35)
Calcium: 9.8 mg/dL (ref 8.4–10.5)
Chloride: 102 mEq/L (ref 96–112)
GFR calc Af Amer: >90 mL/min (ref >90)
Glucose, Bld: 96 mg/dL (ref 70–99)
Potassium: 4.2 mEq/L (ref 3.7–5.3)
Sodium: 142 mEq/L (ref 137–147)
Total Protein: 8 g/dL (ref 6.0–8.3)

## 2014-10-01 LAB — LACTATE DEHYDROGENASE: LDH: 133 U/L (ref 94–250)

## 2014-10-01 NOTE — Progress Notes (Signed)
LABS FOR CBCD,CMP,LDH,AFPTM,HCGTM

## 2014-10-01 NOTE — Patient Instructions (Signed)
[facility] Discharge Instructions  RECOMMENDATIONS MADE BY THE CONSULTANT AND ANY TEST RESULTS WILL BE SENT TO YOUR REFERRING PHYSICIAN.  EXAM FINDINGS BY THE PHYSICIAN TODAY AND SIGNS OR SYMPTOMS TO REPORT TO CLINIC OR PRIMARY PHYSICIAN: Exam and findings as discussed by Dr. Barnet Glasgow.  You are doing well.  If any issues with your labs we will contact you.  Will do ultrasound of your other testis in January.     INSTRUCTIONS/FOLLOW-UP: Ultrasound in January Labs and office visit in 3 months.  Thank you for choosing [facility] to provide your oncology and hematology care.  To afford each patient quality time with our providers, please arrive at least 15 minutes before your scheduled appointment time.  With your help, our goal is to use those 15 minutes to complete the necessary work-up to ensure our physicians have the information they need to help with your evaluation and healthcare recommendations.    Effective January 1st, 2014, we ask that you re-schedule your appointment with our physicians should you arrive 10 or more minutes late for your appointment.  We strive to give you quality time with our providers, and arriving late affects you and other patients whose appointments are after yours.    Again, thank you for choosing [facility]  Our hope is that these requests will decrease the amount of time that you wait before being seen by our physicians.       _____________________________________________________________  Should you have questions after your visit to [facility], please contact our office at [phone] between the hours of 8:30 a.m. and 4:30 p.m.  Voicemails left after 4:30 p.m. will not be returned until the following business day.  For prescription refill requests, have your pharmacy contact our office with your prescription refill request.     _______________________________________________________________  We hope that we have given you very good care.  You may receive a patient satisfaction survey in the mail, please complete it and return it as soon as possible.  We value your feedback!  _______________________________________________________________  Have you asked about our STAR program?  STAR stands for Survivorship Training and Rehabilitation, and this is a nationally recognized cancer care program that focuses on survivorship and rehabilitation.  Cancer and cancer treatments may cause problems, such as, pain, making you feel tired and keeping you from doing the things that you need or want to do. Cancer rehabilitation can help. Our goal is to reduce these troubling effects and help you have the best quality of life possible.  You may receive a survey from a nurse that asks questions about your current state of health.  Based on the survey results, all eligible patients will be referred to the [facility] program for an evaluation so we can better serve you!  A frequently asked questions sheet is available upon request.

## 2014-10-01 NOTE — Progress Notes (Signed)
[place]  OFFICE PROGRESS NOTE  [name], MD [address] [place] [address]  DIAGNOSIS: Cancer of testis, non-seminomatous, left - Plan: CBC with Differential, Comprehensive metabolic panel, Lactate dehydrogenase, AFP tumor marker, HCG, tumor marker, US Scrotum  Mental retardation  Other seasonal allergic rhinitis  Chief Complaint  Patient presents with  . Cancer of testis, non-seminomatous, left  . 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  INTERVAL HISTORY: Devon Bailey 23 y.o. male returns forfollowup of stage IV nonseminomatous germ cell tumor, status post left radical orchiectomy followed by chemotherapy utilizing cisplatin, etoposide, and bleomycin with 2 cycles of BEP followed by 4 cycles of EP due to abnormalities in DLCO with last cycle of treatment beginning on 01/26/2014. Port-A-Cath was removed in July 2015 to 2 erosion through the overlying skin with wound healing by secondary intention utilizing regular dressing changes. Area where LifePort previously was placed does have times of minimal pain without swelling or redness. Appetite is good with some shortness of breath on exertion. He denies any cough, wheezing, chest pain, abdominal pain, nausea, vomiting, diarrhea, constipation, melena, hematochezia, hematuria, urinary hesitancy, lower extremity swelling or redness, peripheral paresthesias, skin rash, headache, or seizures. He does practice self testicle examination.  MEDICAL HISTORY: Past Medical History  Diagnosis Date  . Anxiety   . Seasonal allergies   . Mentally challenged     mother states mentally "slow"  . Autistic disorder   . 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   Oncology History    Patient experienced trauma 1-2 years prior to diagnosis. A Bard enlarging mass until mother discovered it. Underwent left radical orchiectomy on 08/07/2013. No scans or repeat lab tests have been performed until the time he presented at the [facility] for evaluation.     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  Pre op a-FP =1620 on 07/22/2013  B-HCG=3225   11/24/2013 - 12/29/2013 Chemotherapy BEP 2, discontinued because of decrease in DLCO   01/05/2014 - 01/26/2014 Chemotherapy EP 4 with normalization of beta-hCG and alpha-fetoprotein     ALLERGIES:  is allergic to penicillins.  MEDICATIONS: has a current medication list which includes the following prescription(s): acetaminophen, and the following Facility-Administered Medications: lorazepam and sodium chloride.  SURGICAL HISTORY:  Past Surgical History  Procedure Laterality Date  . Orchiectomy Left 08/07/2013    Procedure: RADICAL ORCHIECTOMY;  Surgeon: Marissa Nestle, MD;  Location: [facility];  Service: Urology;  Laterality: Left;  . Radical orchiectomy Left 08/07/2013  . Portacath placement Right   . Port-a-cath removal Right July 2015    FAMILY HISTORY: family history includes Cancer in his maternal aunt and maternal aunt.  SOCIAL HISTORY:  reports that he has never smoked. He has never used smokeless tobacco. He reports that he does not drink alcohol or use illicit drugs.  REVIEW OF SYSTEMS:  Other than that discussed above is noncontributory.  PHYSICAL EXAMINATION: ECOG PERFORMANCE STATUS: 0 - Asymptomatic  Blood pressure 116/75, pulse 88, temperature 99 F (37.2 C), temperature source Oral, resp.  rate 16, weight 216 lb 6.4 oz (98.158 kg), SpO2 100 %.  GENERAL:alert, no distress and  comfortabledid resolve alopecia. SKIN: skin color, texture, turgor are normal, no rashes or significant lesions EYES: PERLA; Conjunctiva are pink and non-injected, sclera clear SINUSES: No redness or tenderness over maxillary or ethmoid sinuses OROPHARYNX:no exudate, no erythema on lips, buccal mucosa, or tongue. NECK: supple, thyroid normal size, non-tender, without nodularity. No masses CHEST: status post life port removal site with no evidence of purulence or redness. LYMPH:  no palpable lymphadenopathy in the cervical, axillary or inguinal LUNGS: clear to auscultation and percussion with normal breathing effort HEART: regular rate & rhythm and no murmurs. ABDOMEN:abdomen soft, non-tender and normal bowel sounds MUSCULOSKELETAL:no cyanosis of digits and no clubbing. Range of motion normal.  NEURO: alert & oriented x 3 with fluent speech, no focal motor/sensory deficits GENITALIA: absent left testis. Right testis with no mass.  LABORATORY DATA:  11/24/2013: alpha-fetoprotein 74.6, beta-hCG 74601 _0  of chemotherapy  Results for Devon, Bailey (MRN [id number]) as of 10/01/2014 09:25  Ref. Range 01/05/2014 09:32 01/26/2014 10:30 02/16/2014 09:47 04/27/2014 09:52 06/09/2014 09:34  AFP Tumor Marker Latest Range: 0.0-8.0 ng/mL 7.4 9.2 (H) 6.1 4.8 5.3  Beta hCG, Tumor Marker Latest Range: < 5.0 mIU/mL 14.8 (H) 2.6 < 2.0 < 2.0 < 2.0    Lab on 10/01/2014  Component Date Value Ref Range Status  . WBC 10/01/2014 5.2  4.0 - 10.5 K/uL Final  . RBC 10/01/2014 4.83  4.22 - 5.81 MIL/uL Final  . Hemoglobin 10/01/2014 14.9  13.0 - 17.0 g/dL Final  . HCT 10/01/2014 42.3  39.0 - 52.0 % Final  . MCV 10/01/2014 87.6  78.0 - 100.0 fL Final  . MCH 10/01/2014 30.8  26.0 - 34.0 pg Final  . MCHC 10/01/2014 35.2  30.0 - 36.0 g/dL Final  . RDW 10/01/2014 12.3  11.5 - 15.5 % Final  . Platelets 10/01/2014 189  150 - 400 K/uL Final  . Neutrophils Relative % 10/01/2014 63  43 - 77 % Final  . Neutro Abs 10/01/2014  3.2  1.7 - 7.7 K/uL Final  . Lymphocytes Relative 10/01/2014 25  12 - 46 % Final  . Lymphs Abs 10/01/2014 1.3  0.7 - 4.0 K/uL Final  . Monocytes Relative 10/01/2014 11  3 - 12 % Final  . Monocytes Absolute 10/01/2014 0.6  0.1 - 1.0 K/uL Final  . Eosinophils Relative 10/01/2014 1  0 - 5 % Final  . Eosinophils Absolute 10/01/2014 0.1  0.0 - 0.7 K/uL Final  . Basophils Relative 10/01/2014 0  0 - 1 % Final  . Basophils Absolute 10/01/2014 0.0  0.0 - 0.1 K/uL Final  . Sodium 10/01/2014 142  137 - 147 mEq/L Final  . Potassium 10/01/2014 4.2  3.7 - 5.3 mEq/L Final  . Chloride 10/01/2014 102  96 - 112 mEq/L Final  . CO2 10/01/2014 28  19 - 32 mEq/L Final  . Glucose, Bld 10/01/2014 96  70 - 99 mg/dL Final  . BUN 10/01/2014 15  6 - 23 mg/dL Final  . Creatinine, Ser 10/01/2014 1.11  0.50 - 1.35 mg/dL Final  . Calcium 10/01/2014 9.8  8.4 - 10.5 mg/dL Final  . Total Protein 10/01/2014 8.0  6.0 - 8.3 g/dL Final  . Albumin 10/01/2014 4.5  3.5 - 5.2 g/dL Final  . AST 10/01/2014 15  0 - 37 U/L Final  . ALT 10/01/2014 17  0 - 53 U/L Final  . Alkaline Phosphatase 10/01/2014  86  39 - 117 U/L Final  . Total Bilirubin 10/01/2014 0.5  0.3 - 1.2 mg/dL Final  . GFR calc non Af Amer 10/01/2014 >90  >90 mL/min Final  . GFR calc Af Amer 10/01/2014 >90  >90 mL/min Final   Comment: (NOTE) The eGFR has been calculated using the CKD EPI equation. This calculation has not been validated in all clinical situations. eGFR's persistently <90 mL/min signify possible Chronic Kidney Disease.   . Anion gap 10/01/2014 12  5 - 15 Final  . LDH 10/01/2014 133  94 - 250 U/L Final   PATHOLOGY:  FINAL for Devon, Bailey ([id number]) Patient: Devon, Bailey Collected: 08/07/2013 Client: [facility] Accession: [id number] Received: 08/07/2013 Ricki Miller DOB: [birth year] Age: 28 Gender: M Reported: 08/12/2013 [address] Patient Ph: [phone] MRN #: [id number] Linna Hoff [place] [address] Visit #: [id number]  Chart #: Phone: [phone] Fax: CC: REPORT OF SURGICAL PATHOLOGY FINAL DIAGNOSIS Diagnosis Testis, tumor, left - MIXED GERM CELL TUMOR, 14 CM. PLEASE SEE ONCOLOGY TEMPLATE FOR DETAILS. Microscopic Comment ONCOLOGY TABLE - TESTIS 1. Specimen and laterality: Left testis. 2. Tumor focality: Unifocal. 3. Macroscopic extent of tumor: Confined within testis. 4. Maximum tumor size (cm): 14 cm. 5. 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 6. Microscopic tumor extension: Limited to testis. 7. Spermatic cord and surgical margins: Negative. 8. Lymph-Vascular invasion: Present. 9. Intratubular germ cell neoplasia: N/A. 10. Lymph nodes: # examined: N/A; # positive: N/A 11. TNM code: pT2, pNX 12. Serum tumor markers: See patient's medical record 13. 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 Focal angiolymphatic invasion is present. Confirmatory immunostains were performed for CD30, beta hCG, AFP, CD117, PLAP, and Pan-Cytokeratin AE1/AE3 were performed and the immunostains highlight the different components of the tumor. Control stained appropriately. Dr Saralyn Pilar agrees. (HCL:ecj 08/12/2013) Aldona Bar MD Pathologist, Electronic Signature (Case signed 08/12/2013) Specimen Gross and Clinical Information Specimen(s) Obtained: Testis, tumor, left 1 of 2 FINAL for Devon, Bailey [phone]) Specimen Clinical Information left testicular tumor Gross Received in formalin is a 784-gram, 14.4 x 10.3 x 8.5 cm testicle, which includes a 10 cm segment of spermatic cord. The tunica vaginalis is translucent tan-pink. The tunica albuginea is smooth tan-white. The cut surface of the testicle shows complete replacement by a firm, tan-white to pale yellow tumor. There are small cystic spaces present measuring up to 0.7  cm. The cut surface of the epididymis is unremarkable. Sections are submitted in six cassettes: A = spermatic cord margin. B - F = tumor. (GRP:ecj 08/08/2013) Stain(s) used in Diagnosis: The following stain(s) were used in diagnosing the case: Alpha-Feto Protein, CD117 (C-KIT), CK AE1AE3, Beta Human Chorionic Gonadotrophin, Placental Alkaline Phosphatase, CD 30. The control(s) stained appropriately. Disclaimer Some of these immunohistochemical stains may have been developed and the performance characteristics determined by [facility]. Some may not have been cleared or approved by the U.S. Food and Drug Administration. The FDA has determined that such clearance or approval is not necessary. This test is used for clinical purposes. It should not be regarded as investigational or for research. This laboratory is certified under the [place] (CLIA-88) as qualified to perform high complexity clinical laboratory testing. Report signed out from the following location(s) Technical Component performed at [place], [place] [address] CLIA:  [id number]., Technical Component performed at [facility], [place], [place] [address]. CLIA #: [id number], Interpretation performed at [place], [place], [place] [address]. CLIA  Urinalysis    Component Value Date/Time   COLORURINE YELLOW 07/05/2013 2240   APPEARANCEUR CLEAR 07/05/2013 2240   LABSPEC 1.025 07/05/2013 2240   PHURINE 8.5* 07/05/2013 2240   GLUCOSEU NEGATIVE 07/05/2013 2240   HGBUR NEGATIVE 07/05/2013 2240   BILIRUBINUR NEGATIVE 07/05/2013 2240   KETONESUR NEGATIVE 07/05/2013 2240   PROTEINUR NEGATIVE 07/05/2013 2240   UROBILINOGEN 0.2 07/05/2013 2240   NITRITE NEGATIVE 07/05/2013 2240   LEUKOCYTESUR NEGATIVE 07/05/2013 2240    RADIOGRAPHIC STUDIES: No results found. Pelvis W Contrast    Status: Final result       PACS Images     Show images for CT Abdomen Pelvis W Contrast     Study Result     CLINICAL DATA: Restaging testicular cancer  EXAM: CT CHEST, ABDOMEN, AND PELVIS WITH CONTRAST  TECHNIQUE: Multidetector CT imaging of the chest, abdomen and pelvis was performed following the standard protocol during bolus administration of intravenous contrast.  CONTRAST: 147m OMNIPAQUE IOHEXOL 300 MG/ML SOLN  COMPARISON: 02/06/2014  FINDINGS: CT CHEST FINDINGS  There is no pleural effusion identified. No airspace consolidation identified. The cavitary lesion within the left lower lobe measures 3.4 cm, image 40/series 2. Previously 4.9 cm. There is an adjacent bilobed lesion closer to the diaphragm which measures 4.5 x 1.7 cm, image 45/series 2. Previous 6.3 x 2.2 cm. There is no new suspicious nodule or mass. No enlarged mediastinal or hilar lymph nodes. No axillary or supraclavicular adenopathy. Review of the visualized bony structures is negative for aggressive lytic or sclerotic bone lesion.  CT ABDOMEN AND PELVIS FINDINGS  Bone there is no focal liver abnormality. The gallbladder is normal. No biliary dilatation. Low-attenuation structure within the spleen measures 2.3 cm, image 57/series 2 and is similar to previous exam.  The adrenal glands both appear normal. The kidneys are both on unremarkable. The urinary bladder appears normal. The prostate gland and seminal vesicles appear normal.  Normal caliber of the abdominal aorta. No aneurysm. No upper abdominal adenopathy. There is no pelvic or inguinal adenopathy identified.  The stomach appears normal. The small bowel loops have a normal course and caliber. No obstruction. The appendix is visualized and appears normal. The colon is on unremarkable.  There is no free fluid or fluid collections within the abdomen or pelvis. Review of the visualized osseous structures is negative.  No aggressive lytic or sclerotic bone lesions.  IMPRESSION: 1. Pulmonary lesions within the left lower lobe have decreased in size from previous exam consistent with response to therapy. 2. No new or progressive disease identified.   Electronically Signed  By: TKerby MoorsM.D.  On: 06/15/2014 13    ASSESSMENT:  #1. Nonseminomatous germ cell tumor of the testis, stage IV, status post 4 cycles of chemotherapy, improved CT scan, with normal tumor markers done 06/09/2014.  #2. Allergic rhinitis, worsening, refusing to use recommended antihistamines.  #3. Mild mental retardation.  #4. 78% predicted DLCO on pulmonary function tests. This compares to 73% predicted done on 11/06/2013 prior to treatment with improvement to 86% on 12/17/2013. It was presumed that the original baseline abnormality was due to mediastinal and lymphatic involvement with tumor but with subsequent drop back to 78% on 01/01/2014, bleomycin was discontinued and the last 2 cycles of chemotherapy given without it. DLCO has leveled off at  78% of predicted.   PLAN:  #1. Continue watchful expectation and monthly self testicle exam.  #2. Ultrasound scrotum in January 2016. #3. Follow-up in 3 months with CBC, chem profile, alpha-fetoprotein, beta-hCG.   All questions were answered. The patient knows to call the clinic with any problems, questions or concerns. We can certainly see the patient much sooner if necessary.   I spent 25 minutes counseling the patient face to face. The total time spent in the appointment was 30 minutes.    Doroteo Bradford, MD 10/01/2014 10:09 AM  DISCLAIMER:  This note was dictated with voice recognition software.  Similar sounding words can inadvertently be transcribed inaccurately and may not be corrected upon review.

## 2014-10-02 LAB — AFP TUMOR MARKER: AFP-Tumor Marker: 5.3 ng/mL (ref <6.1)

## 2014-10-02 LAB — BETA HCG QUANT (REF LAB): Beta hCG, Tumor Marker: <2 m[IU]/mL (ref <5.0)

## 2014-11-13 ENCOUNTER — Other Ambulatory Visit (HOSPITAL_COMMUNITY): Payer: Self-pay | Admitting: Hematology and Oncology

## 2014-11-13 DIAGNOSIS — C6292 Malignant neoplasm of left testis, unspecified whether descended or undescended: Secondary | ICD-10-CM

## 2014-11-24 ENCOUNTER — Ambulatory Visit (HOSPITAL_COMMUNITY)
Admission: RE | Admit: 2014-11-24 | Discharge: 2014-11-24 | Disposition: A | Discharge status: Home / Self Care | Payer: Self-pay | Source: Ambulatory Visit | Attending: Hematology and Oncology | Admitting: Hematology and Oncology

## 2014-11-24 DIAGNOSIS — C6292 Malignant neoplasm of left testis, unspecified whether descended or undescended: Secondary | ICD-10-CM | POA: Insufficient documentation

## 2014-11-24 DIAGNOSIS — Z9079 Acquired absence of other genital organ(s): Secondary | ICD-10-CM | POA: Insufficient documentation

## 2015-01-01 ENCOUNTER — Ambulatory Visit (HOSPITAL_COMMUNITY): Payer: Self-pay | Admitting: Hematology & Oncology

## 2015-01-01 ENCOUNTER — Other Ambulatory Visit (HOSPITAL_COMMUNITY): Payer: Self-pay

## 2015-01-18 ENCOUNTER — Ambulatory Visit (HOSPITAL_COMMUNITY): Payer: Self-pay | Admitting: Hematology & Oncology

## 2015-01-18 ENCOUNTER — Other Ambulatory Visit (HOSPITAL_COMMUNITY): Payer: Self-pay

## 2015-03-01 ENCOUNTER — Encounter (HOSPITAL_COMMUNITY): Payer: Self-pay | Attending: Hematology & Oncology | Admitting: Hematology & Oncology

## 2015-03-01 VITALS — BP 113/72 | HR 83 | Temp 98.8°F | Resp 18 | Wt 227.9 lb

## 2015-03-01 DIAGNOSIS — C6292 Malignant neoplasm of left testis, unspecified whether descended or undescended: Secondary | ICD-10-CM | POA: Insufficient documentation

## 2015-03-01 DIAGNOSIS — C7800 Secondary malignant neoplasm of unspecified lung: Secondary | ICD-10-CM

## 2015-03-01 LAB — COMPREHENSIVE METABOLIC PANEL
ALK PHOS: 79 U/L (ref 39–117)
ALT: 25 U/L (ref 0–53)
ANION GAP: 9 (ref 5–15)
AST: 24 U/L (ref 0–37)
Albumin: 4.8 g/dL (ref 3.5–5.2)
BUN: 17 mg/dL (ref 6–23)
CO2: 24 mmol/L (ref 19–32)
CREATININE: 1.01 mg/dL (ref 0.50–1.35)
Calcium: 9.8 mg/dL (ref 8.4–10.5)
Chloride: 108 mmol/L (ref 96–112)
GFR calc non Af Amer: >90 mL/min (ref >90)
Glucose, Bld: 94 mg/dL (ref 70–99)
POTASSIUM: 3.9 mmol/L (ref 3.5–5.1)
Sodium: 141 mmol/L (ref 135–145)
TOTAL PROTEIN: 7.9 g/dL (ref 6.0–8.3)
Total Bilirubin: 0.4 mg/dL (ref 0.3–1.2)

## 2015-03-01 LAB — CBC WITH DIFFERENTIAL/PLATELET
BASOS PCT: 0 % (ref 0–1)
Basophils Absolute: 0 10*3/uL (ref 0.0–0.1)
EOS ABS: 0.1 10*3/uL (ref 0.0–0.7)
EOS PCT: 1 % (ref 0–5)
HCT: 41.9 % (ref 39.0–52.0)
Hemoglobin: 14.4 g/dL (ref 13.0–17.0)
Lymphocytes Relative: 26 % (ref 12–46)
Lymphs Abs: 1.6 10*3/uL (ref 0.7–4.0)
MCH: 29.4 pg (ref 26.0–34.0)
MCHC: 34.4 g/dL (ref 30.0–36.0)
MCV: 85.5 fL (ref 78.0–100.0)
MONOS PCT: 10 % (ref 3–12)
Monocytes Absolute: 0.6 10*3/uL (ref 0.1–1.0)
NEUTROS ABS: 3.8 10*3/uL (ref 1.7–7.7)
NEUTROS PCT: 63 % (ref 43–77)
Platelets: 207 10*3/uL (ref 150–400)
RBC: 4.9 MIL/uL (ref 4.22–5.81)
RDW: 12.3 % (ref 11.5–15.5)
WBC: 6 10*3/uL (ref 4.0–10.5)

## 2015-03-01 LAB — LACTATE DEHYDROGENASE: LDH: 183 U/L (ref 94–250)

## 2015-03-01 NOTE — Progress Notes (Signed)
Devon Berry, MD [address] [place] [address]    DIAGNOSIS: Cancer of testis, non-seminomatous   Staging form: Testis, AJCC 7th Edition     Clinical stage from 08/07/2013: Stage IB (T2, NX, M0) - Unsigned     Pathologic: Stage IIIC (T2, NX, M1b) - Unsigned  Stage IV nonseminomatous testicular cancer with mediastinal and lung metastases, status post chemotherapy completed 01/30/2014. He received 2 cycles of BEP and 2 cycles of EP due to DLCO abnormalities   SUMMARY OF ONCOLOGIC HISTORY: Oncology History    Patient experienced trauma 1-2 years prior to diagnosis. A Bard enlarging mass until mother discovered it. Underwent left radical orchiectomy on 08/07/2013. No scans or repeat lab tests have been performed until the time he presented at the [facility] for evaluation.     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  Pre op a-FP =1620 on 07/22/2013  B-HCG=3225   11/24/2013 - 12/29/2013 Chemotherapy BEP 2, discontinued because of decrease in DLCO   01/05/2014 - 01/26/2014 Chemotherapy EP 4 with normalization of beta-hCG and alpha-fetoprotein    CURRENT THERAPY: Observation  INTERVAL HISTORY: Devon Bailey 24 y.o. male returns for follow-up of testicular cancer. He completed chemotherapy in March of last year.  He has no major complaints.  His mother reports he is doing well. His appetite is good. He denies any significant change in his energy level. He has no new fatigue. He denies SOB or cough  MEDICAL HISTORY: Past Medical History  Diagnosis Date  . Anxiety   . Seasonal allergies   . Mentally challenged     mother states mentally "slow"  . Autistic disorder   . 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     is allergic to penicillins.  Mr. Yom does not currently have medications on file.  SURGICAL HISTORY: Past Surgical History  Procedure Laterality Date  . Orchiectomy Left 08/07/2013    Procedure: RADICAL ORCHIECTOMY;  Surgeon: Marissa Nestle, MD;  Location: [facility];  Service: Urology;  Laterality: Left;  . Radical orchiectomy Left 08/07/2013  . Portacath placement Right   . Port-a-cath removal Right July 2015    SOCIAL HISTORY: History   Social History  . Marital Status: Single    Spouse Name: N/A  . Number of Children: N/A  . Years of Education: N/A   Occupational History  . Not on file.   Social History Main Topics  . Smoking status: Never Smoker   . Smokeless tobacco: Never Used  . Alcohol Use: No  . Drug Use: No  . Sexual Activity: Not on file   Other Topics Concern  . Not on file   Social History Narrative    FAMILY HISTORY: Family History  Problem Relation Age of Onset  . Cancer Maternal Aunt   . Cancer Maternal Aunt     Review of Systems  Constitutional: Negative for fever, chills, weight loss and malaise/fatigue.  HENT: Negative for congestion, hearing loss, nosebleeds, sore throat and tinnitus.   Eyes: Negative for blurred vision, double vision, pain and discharge.  Respiratory: Negative for cough, hemoptysis, sputum production, shortness of breath and wheezing.   Cardiovascular: Negative for chest  pain, palpitations, claudication, leg swelling and PND.  Gastrointestinal: Negative for heartburn, nausea, vomiting, abdominal pain, diarrhea, constipation, blood in stool and melena.  Genitourinary: Negative for dysuria, urgency, frequency and hematuria.  Musculoskeletal: Negative for myalgias, joint pain and falls.  Skin: Negative for itching and rash.  Neurological: Negative for dizziness, tingling, tremors, sensory change, speech change, focal weakness, seizures, loss  of consciousness, weakness and headaches.  Endo/Heme/Allergies: Does not bruise/bleed easily.  Psychiatric/Behavioral: Negative for depression, suicidal ideas, memory loss and substance abuse. The patient is not nervous/anxious and does not have insomnia.     PHYSICAL EXAMINATION  ECOG PERFORMANCE STATUS: 0 - Asymptomatic  There were no vitals filed for this visit.  Physical Exam  Constitutional: He is oriented to person, place, and time and well-developed, well-nourished, and in no distress.  HENT:  Head: Normocephalic and atraumatic.  Nose: Nose normal.  Mouth/Throat: Oropharynx is clear and moist. No oropharyngeal exudate.  Eyes: Conjunctivae and EOM are normal. Pupils are equal, round, and reactive to light. Right eye exhibits no discharge. Left eye exhibits no discharge. No scleral icterus.  Neck: Normal range of motion. Neck supple. No tracheal deviation present. No thyromegaly present.  Cardiovascular: Normal rate, regular rhythm and normal heart sounds.  Exam reveals no gallop and no friction rub.   No murmur heard. Pulmonary/Chest: Effort normal and breath sounds normal. He has no wheezes. He has no rales.  Abdominal: Soft. Bowel sounds are normal. He exhibits no distension and no mass. There is no tenderness. There is no rebound and no guarding.  Musculoskeletal: Normal range of motion. He exhibits no edema.  Lymphadenopathy:    He has no cervical adenopathy.  Neurological: He is alert and oriented to person, place, and time. He has normal reflexes. No cranial nerve deficit. Gait normal. Coordination normal.  Skin: Skin is warm and dry. No rash noted.  Psychiatric: Mood, memory, affect and judgment normal.  Nursing note and vitals reviewed.   LABORATORY DATA:  CBC    Component Value Date/Time   WBC 5.2 10/01/2014 0914   RBC 4.83 10/01/2014 0914   RBC 4.88 10/22/2013 1530   HGB 14.9 10/01/2014 0914   HCT 42.3 10/01/2014 0914   PLT 189 10/01/2014 0914   MCV 87.6  10/01/2014 0914   MCH 30.8 10/01/2014 0914   MCHC 35.2 10/01/2014 0914   RDW 12.3 10/01/2014 0914   LYMPHSABS 1.3 10/01/2014 0914   MONOABS 0.6 10/01/2014 0914   EOSABS 0.1 10/01/2014 0914   BASOSABS 0.0 10/01/2014 0914   CMP     Component Value Date/Time   NA 142 10/01/2014 0914   K 4.2 10/01/2014 0914   CL 102 10/01/2014 0914   CO2 28 10/01/2014 0914   GLUCOSE 96 10/01/2014 0914   BUN 15 10/01/2014 0914   CREATININE 1.11 10/01/2014 0914   CALCIUM 9.8 10/01/2014 0914   PROT 8.0 10/01/2014 0914   ALBUMIN 4.5 10/01/2014 0914   AST 15 10/01/2014 0914   ALT 17 10/01/2014 0914   ALKPHOS 86 10/01/2014 0914   BILITOT 0.5 10/01/2014 0914   GFRNONAA >90 10/01/2014 0914   GFRAA >90 10/01/2014 0914    Results for JOZIYAH, ROBLERO (MRN [id number]) as of 03/28/2015 17:47  Ref. 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 He received 2 cycles of BEP and 2 cycles of EP due to DLCO abnormalities R Testicular microlithiasis Left pulmonary lower lobe cavitary lesion  He still had abnormal CT imaging last year although his tumor markers have normalized. This has to be followed as he may have a residual component of germ cell tumor.  I have recommended repeating his CT scans and will advise the patient and his mother of the results.  If he has residual abnormality in the lung on repeat imaging we will discuss how to proceed. I will bring the patient back into the clinic for fruther advisement.  Otherwise, should his scans be WNL, we will continue with observation in accordance with NCCN guidelines.    All questions were answered. The patient knows to call the clinic with any problems, questions or concerns. We can certainly see the patient  much sooner if necessary. This note was electronically signed. Molli Hazard MD 03/01/2015

## 2015-03-01 NOTE — Patient Instructions (Signed)
[place] at [facility] Discharge Instructions  RECOMMENDATIONS MADE BY THE CONSULTANT AND ANY TEST RESULTS WILL BE SENT TO YOUR REFERRING PHYSICIAN.  Exam and discussion by Dr. Whitney Muse. Will check some labs today and do scans of your chest abdomen and pelvis. If there are any issues with your labs or scans we will contact you. Call with any concerns.  CT of chest, abdomen and pelvis on 4?21 at 10:15 am.  See prep instructions Follow-up in 4 months.   Thank you for choosing [place] at [facility] to provide your oncology and hematology care.  To afford each patient quality time with our provider, please arrive at least 15 minutes before your scheduled appointment time.    You need to re-schedule your appointment should you arrive 10 or more minutes late.  We strive to give you quality time with our providers, and arriving late affects you and other patients whose appointments are after yours.  Also, if you no show three or more times for appointments you may be dismissed from the clinic at the providers discretion.     Again, thank you for choosing [facility].  Our hope is that these requests will decrease the amount of time that you wait before being seen by our physicians.       _____________________________________________________________  Should you have questions after your visit to [facility], please contact our office at [phone] between the hours of 8:30 a.m. and 4:30 p.m.  Voicemails left after 4:30 p.m. will not be returned until the following business day.  For prescription refill requests, have your pharmacy contact our office.

## 2015-03-02 LAB — AFP TUMOR MARKER: AFP-Tumor Marker: 5.5 ng/mL (ref 0.0–8.3)

## 2015-03-04 LAB — BETA HCG QUANT (REF LAB): Beta hCG, Tumor Marker: <2 m[IU]/mL (ref <5.0)

## 2015-03-11 ENCOUNTER — Ambulatory Visit (HOSPITAL_COMMUNITY)
Admission: RE | Admit: 2015-03-11 | Discharge: 2015-03-11 | Disposition: A | Discharge status: Home / Self Care | Payer: Medicaid Other | Source: Ambulatory Visit | Attending: Hematology & Oncology | Admitting: Hematology & Oncology

## 2015-03-11 DIAGNOSIS — Z923 Personal history of irradiation: Secondary | ICD-10-CM | POA: Insufficient documentation

## 2015-03-11 DIAGNOSIS — Z9221 Personal history of antineoplastic chemotherapy: Secondary | ICD-10-CM | POA: Insufficient documentation

## 2015-03-11 DIAGNOSIS — C6292 Malignant neoplasm of left testis, unspecified whether descended or undescended: Secondary | ICD-10-CM

## 2015-03-11 MED ORDER — IOHEXOL 300 MG/ML  SOLN
100.0000 mL | Freq: Once | INTRAMUSCULAR | Status: AC | PRN
  Administered 2015-03-11: 100 mL via INTRAVENOUS

## 2015-03-28 ENCOUNTER — Encounter (HOSPITAL_COMMUNITY): Payer: Self-pay | Admitting: Hematology & Oncology

## 2015-04-19 ENCOUNTER — Other Ambulatory Visit (HOSPITAL_COMMUNITY): Payer: Self-pay | Admitting: Hematology & Oncology

## 2015-04-19 DIAGNOSIS — C781 Secondary malignant neoplasm of mediastinum: Secondary | ICD-10-CM

## 2015-04-19 DIAGNOSIS — C6290 Malignant neoplasm of unspecified testis, unspecified whether descended or undescended: Secondary | ICD-10-CM

## 2015-06-01 IMAGING — US US ART/VEN ABD/PELV/SCROTUM DOPPLER LTD
1 series · 13 of 25 positions shown · non-contrast
Comparison: None

CLINICAL DATA: Swollen and painful left testicle, evaluate for
torsion

SCROTAL ULTRASOUND
DOPPLER ULTRASOUND OF THE TESTICLES
TECHNIQUE: Complete ultrasound examination of the testicles,
epididymis, and other scrotal structures was performed.  Color and
spectral Doppler ultrasound were also utilized to evaluate blood
flow to the testicles.

[Series 1: us art/ven abd/pelv/scrotum doppler ltd · 0.08mm/px · 73 acquisitions, 13 frames shown]
[im 1/73]
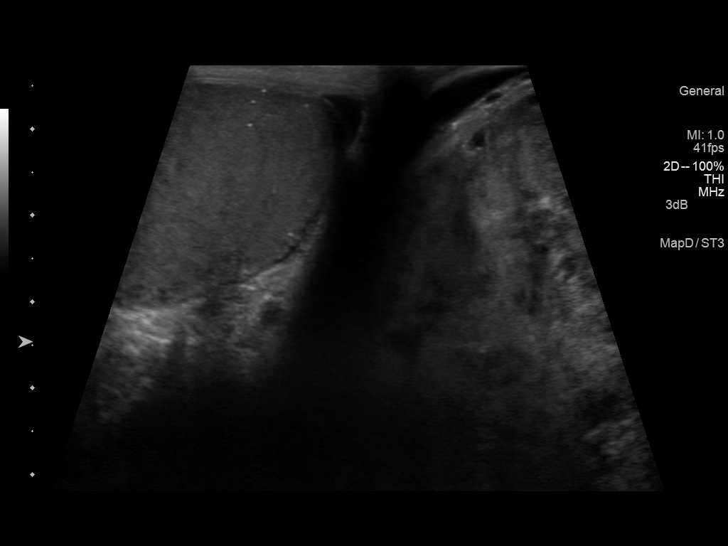
[im 7/73]
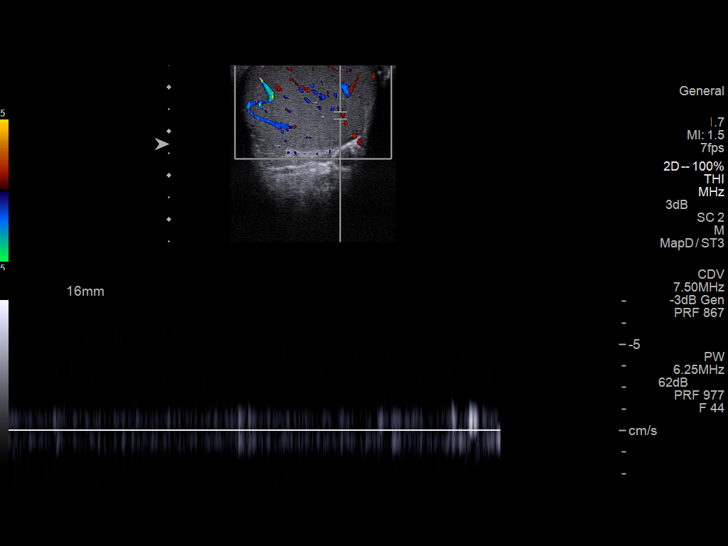
[im 13/73]
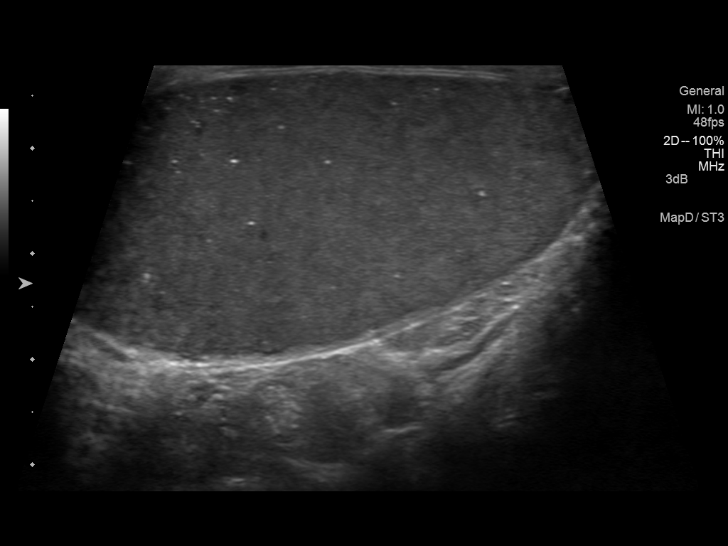
[im 19/73]
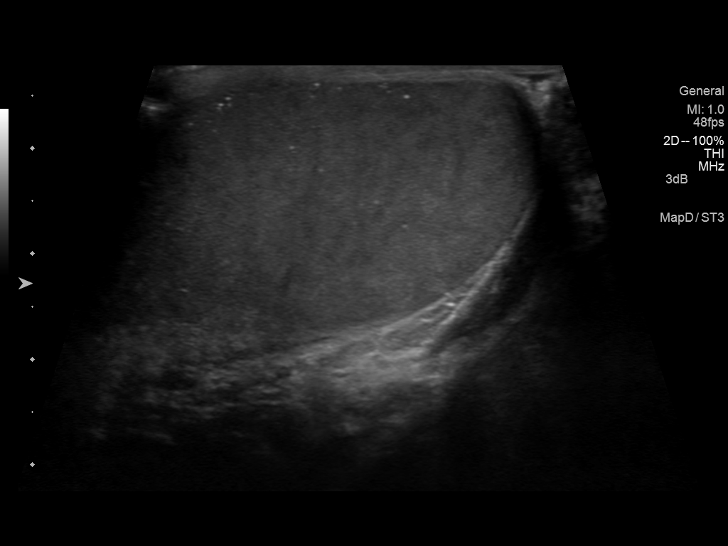
[im 25/73]
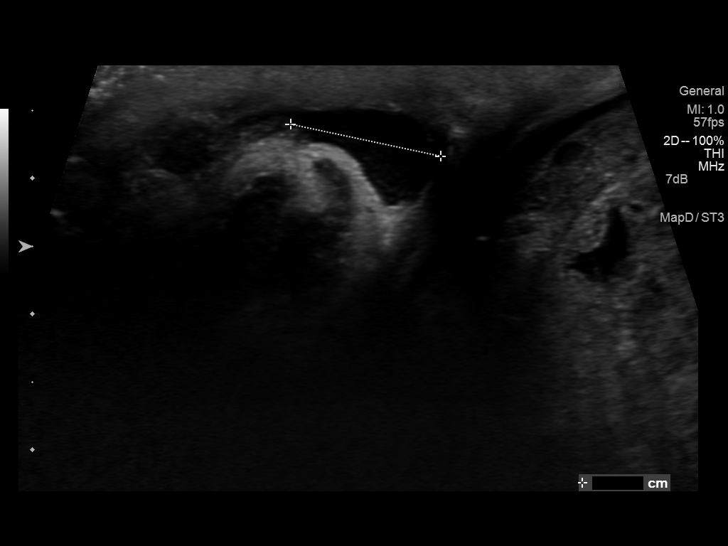
[im 31/73]
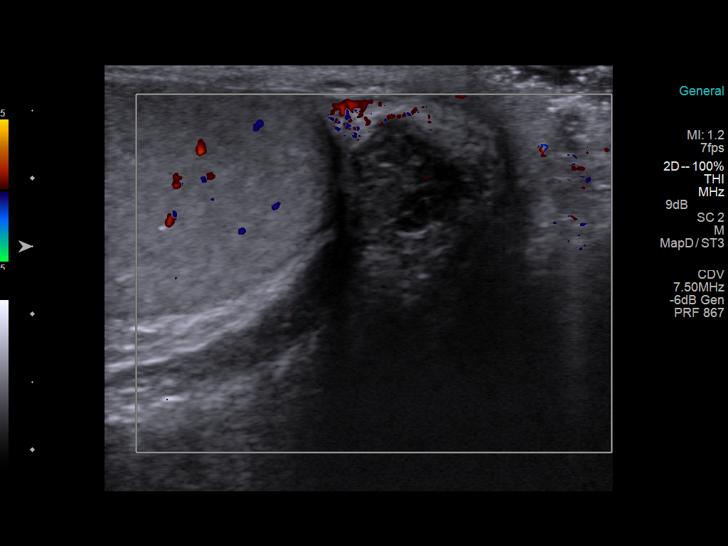
[im 37/73]
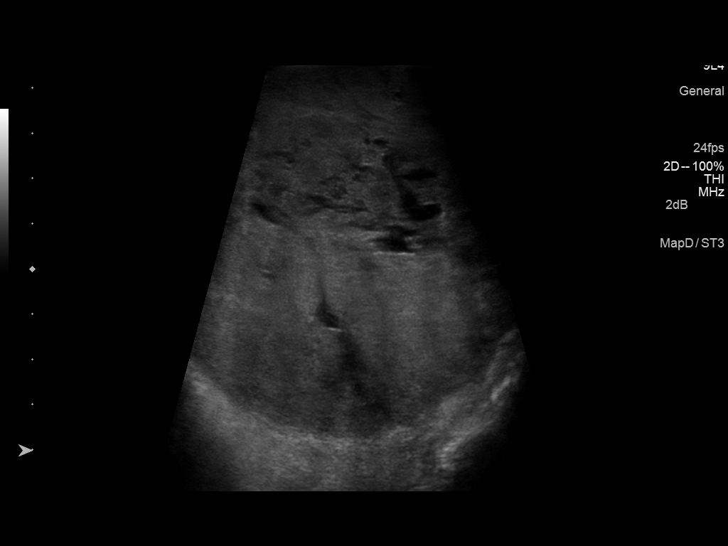
[im 43/73]
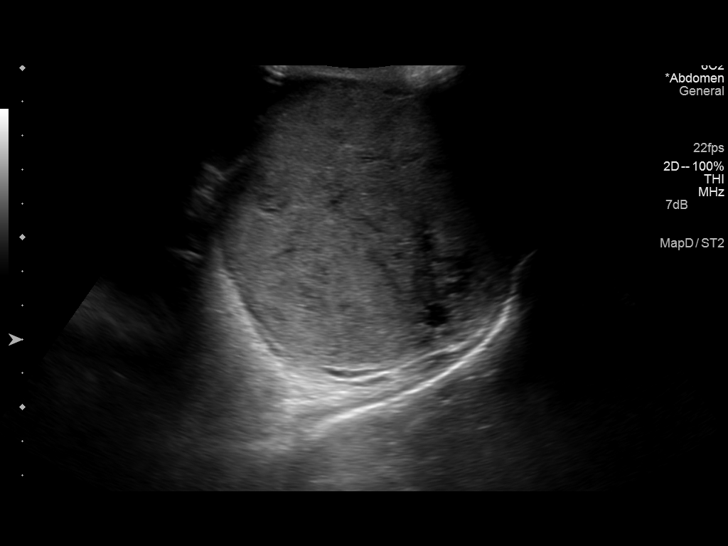
[im 49/73]
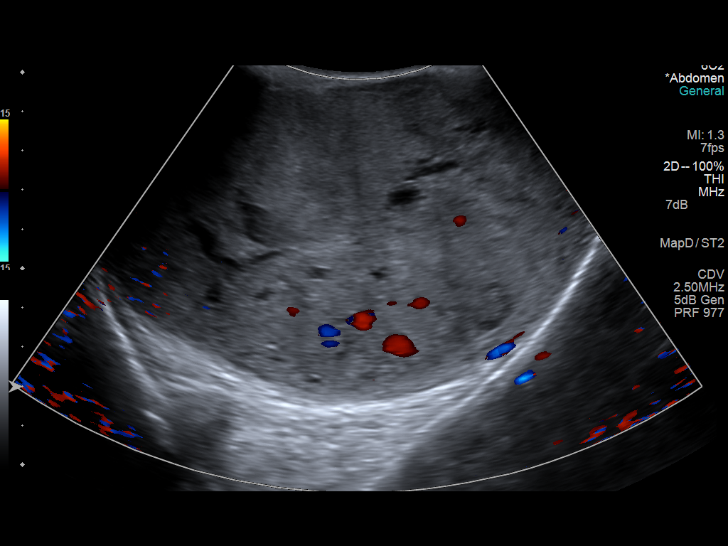
[im 55/73]
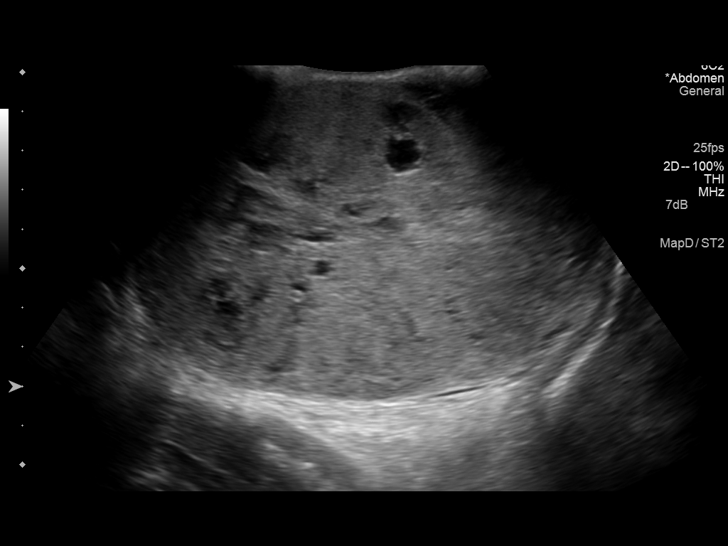
[im 61/73]
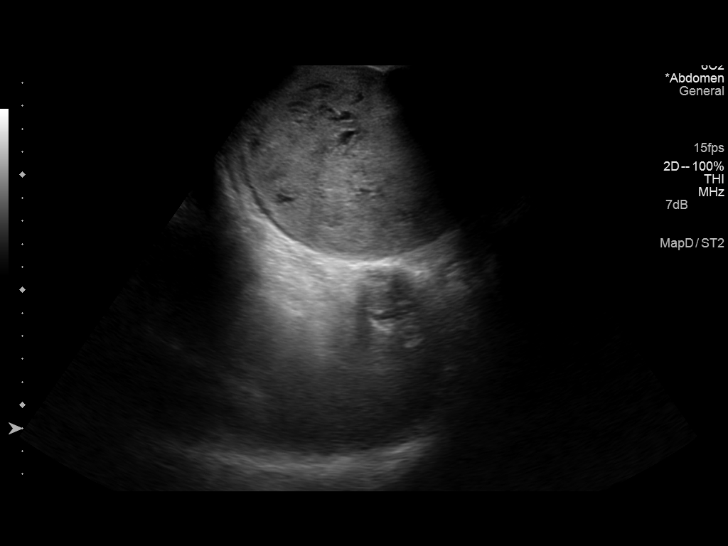
[im 67/73]
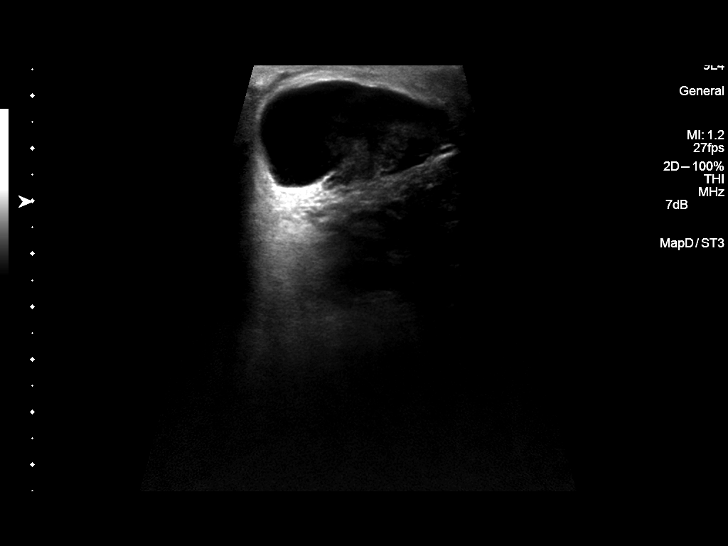
[im 73/73]
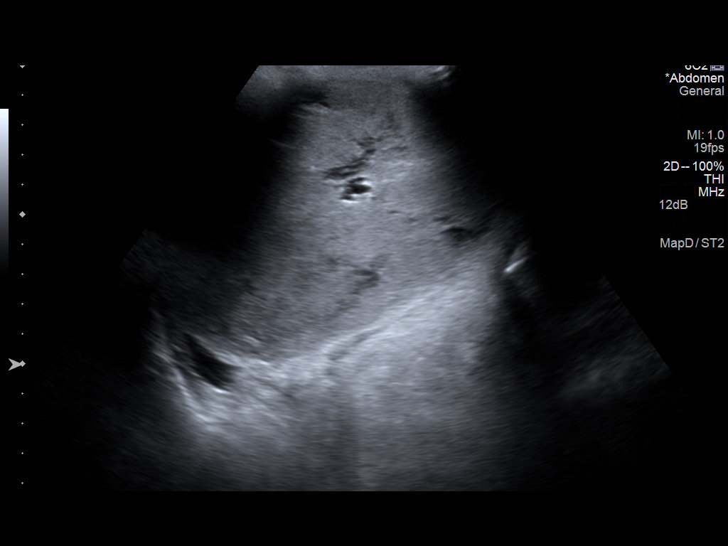

[13 of 25 positions shown; findings below may reference images not displayed]

FINDINGS: Right testis:  The right testicle is within normal limits for size
at 5.1 x 2.7 x 3.3 cm.  Numerous small echogenic reflectors
consistent with microlithiasis noted scattered throughout the
testicular parenchyma.  Both arterial and venous flow are
demonstrated on color and pulsed Doppler imaging.

Left testis:  Diffusely abnormal appearing left testicle.  The
testicle measures approximately 11.6 x 8.1 x 9.1 cm and is
diffusely heterogeneous in appearance.  Both arterial and venous
flow are identified on color and pulsed Doppler imaging. Scattered
hypoechoic cystic/tubular structures are noted scattered throughout
the testicular parenchyma.  No discrete mass lesion identified.

Right epididymis:  Normal in size and appearance.

Left epididymis:  Not seen secondary to diffuse marginal left
testicle.

Hydrocele:  Trace bilateral hydroceles.

Varicocele:  None present.

Pulsed Doppler interrogation of both testes demonstrates low
resistance flow bilaterally.
IMPRESSION: 1.  Diffusely abnormal appearance of the markedly enlarged left
testicle.  The testicle is heterogeneous with multiple small
cystic/tubular areas throughout the parenchyma.  No discrete mass
lesion is identified.  Differential considerations include left
testicular mass/neoplasm replacing the entirety of the testicle,
highly atypical appearance of tubular ectasia of the rete testis,
chronic or granulomatous orchitis, and sequela of prior testicular
trauma. Recommend non emergent urological consultation.

2.  No sonographic evidence of torsion.

3.  Trace bilateral hydroceles.

4.  Right testicular microlithiasis. Current literature suggests
that testicular microlithiasis is not a significant independent
risk factor for development of testicular carcinoma, and that
followup imaging is not warranted in the absence of other risk
factors.  Monthly testicular self-examination and annual physical
exams are considered appropriate surveillance.  If patient has
other risk factors for testicular carcinoma, then referral to
Urology should be considered.  (Reference:  Delowr, et al.:  A 5-
Year Followup Study of Asymptomatic Men with Testicular
Microlithiasis.  J Urol 2664; [id number].)

## 2015-06-25 ENCOUNTER — Ambulatory Visit (HOSPITAL_COMMUNITY)
Admission: RE | Admit: 2015-06-25 | Discharge: 2015-06-25 | Disposition: A | Discharge status: Home / Self Care | Payer: Self-pay | Source: Ambulatory Visit | Attending: Hematology & Oncology | Admitting: Hematology & Oncology

## 2015-06-25 DIAGNOSIS — R918 Other nonspecific abnormal finding of lung field: Secondary | ICD-10-CM | POA: Insufficient documentation

## 2015-06-25 DIAGNOSIS — C781 Secondary malignant neoplasm of mediastinum: Secondary | ICD-10-CM

## 2015-06-25 DIAGNOSIS — Z923 Personal history of irradiation: Secondary | ICD-10-CM | POA: Insufficient documentation

## 2015-06-25 DIAGNOSIS — C6290 Malignant neoplasm of unspecified testis, unspecified whether descended or undescended: Secondary | ICD-10-CM | POA: Insufficient documentation

## 2015-06-25 DIAGNOSIS — Z9221 Personal history of antineoplastic chemotherapy: Secondary | ICD-10-CM | POA: Insufficient documentation

## 2015-06-25 DIAGNOSIS — R590 Localized enlarged lymph nodes: Secondary | ICD-10-CM | POA: Insufficient documentation

## 2015-06-25 MED ORDER — SODIUM CHLORIDE 0.9 % IJ SOLN
INTRAMUSCULAR | Status: AC
  Filled 2015-06-25: qty 30

## 2015-06-25 MED ORDER — IOHEXOL 300 MG/ML  SOLN
75.0000 mL | Freq: Once | INTRAMUSCULAR | Status: AC | PRN
  Administered 2015-06-25: 75 mL via INTRAVENOUS

## 2015-07-01 ENCOUNTER — Other Ambulatory Visit (HOSPITAL_COMMUNITY): Payer: Medicaid Other

## 2015-07-01 ENCOUNTER — Ambulatory Visit (HOSPITAL_COMMUNITY): Payer: Medicaid Other | Admitting: Oncology

## 2015-07-20 ENCOUNTER — Other Ambulatory Visit (HOSPITAL_COMMUNITY): Payer: Self-pay

## 2015-07-20 DIAGNOSIS — C6290 Malignant neoplasm of unspecified testis, unspecified whether descended or undescended: Secondary | ICD-10-CM

## 2015-07-20 DIAGNOSIS — C781 Secondary malignant neoplasm of mediastinum: Secondary | ICD-10-CM

## 2015-07-28 ENCOUNTER — Encounter (HOSPITAL_COMMUNITY): Payer: Self-pay | Admitting: Oncology

## 2015-07-28 ENCOUNTER — Encounter (HOSPITAL_COMMUNITY): Payer: Self-pay | Attending: Oncology

## 2015-07-28 DIAGNOSIS — C781 Secondary malignant neoplasm of mediastinum: Secondary | ICD-10-CM

## 2015-07-28 DIAGNOSIS — C6290 Malignant neoplasm of unspecified testis, unspecified whether descended or undescended: Secondary | ICD-10-CM

## 2015-07-28 LAB — COMPREHENSIVE METABOLIC PANEL
ALBUMIN: 4.6 g/dL (ref 3.5–5.0)
ALK PHOS: 83 U/L (ref 38–126)
ALT: 32 U/L (ref 17–63)
AST: 25 U/L (ref 15–41)
Anion gap: 5 (ref 5–15)
BUN: 11 mg/dL (ref 6–20)
CALCIUM: 9.6 mg/dL (ref 8.9–10.3)
CO2: 29 mmol/L (ref 22–32)
CREATININE: 1.07 mg/dL (ref 0.61–1.24)
Chloride: 105 mmol/L (ref 101–111)
GFR calc Af Amer: >60 mL/min (ref >60)
GFR calc non Af Amer: >60 mL/min (ref >60)
Glucose, Bld: 93 mg/dL (ref 65–99)
Potassium: 4.5 mmol/L (ref 3.5–5.1)
SODIUM: 139 mmol/L (ref 135–145)
Total Bilirubin: 0.6 mg/dL (ref 0.3–1.2)
Total Protein: 8.1 g/dL (ref 6.5–8.1)

## 2015-07-28 LAB — LACTATE DEHYDROGENASE: LDH: 169 U/L (ref 98–192)

## 2015-07-28 NOTE — Assessment & Plan Note (Addendum)
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 He received 2 cycles of BEP and 2 cycles of EP due to DLCO abnormalities  Oncology history updated.  Staging by Dr. Barnet Glasgow corrected.  Since treatment, we have been following a pulmonary lesion that has been stable, and proven again to be stable on recent CT imaging.  We will continue with surveillance of this lesion with a repeat CT Chest in 6 months.  CT abd/pelvis in 6 months for surveillance of testicular ca.  Labs in 3 months: CBC diff, CMET, AFP, HCG  Return in 3 months for follow-up.

## 2015-07-28 NOTE — Progress Notes (Signed)
Rescheduled

## 2015-07-28 NOTE — Progress Notes (Signed)
PROVIDER NOT IN SYSTEM No address on file  Cancer of testis, non-seminomatous, unspecified laterality - Plan: ibuprofen (ADVIL,MOTRIN) 200 MG tablet, CT Abdomen Pelvis W Contrast, CT Chest W Contrast, CBC with Differential, Comprehensive metabolic panel, AFP tumor marker, HCG, tumor marker  CURRENT THERAPY: Surveillance per NCCN guidelines.  INTERVAL HISTORY: Devon Bailey 24 y.o. 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 He received 2 cycles of BEP and 2 cycles of EP due to DLCO abnormalities  Oncology History    Patient experienced trauma 1-2 years prior to diagnosis. A Bard enlarging mass until mother discovered it. Underwent left radical orchiectomy on 08/07/2013. No scans or repeat lab tests have been performed until the time he presented at the [facility] for evaluation.     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  Pre op a-FP =1620 on 07/22/2013  B-HCG=3225   11/24/2013 - 12/29/2013 Chemotherapy BEP 2, discontinued because of decrease in DLCO   01/05/2014 - 01/26/2014 Chemotherapy EP 4 with normalization of beta-hCG and alpha-fetoprotein   06/15/2014 Imaging CT C/A/P Pulmonary lesions within the left lower lobe have decreased in size from previous exam consistent with response to therapy. 2. No new or progressive disease identified.   03/11/2015 Imaging CT CAP- Status post left orchiectomy. New complete resolution of prior left lower lobe mass. Mild residual scarring/opacity measuring 1.5 x 1.6 cm. Mildly prominent mediastinal lymph nodes, indeterminate.No evidence of new/progressive metastatic disease   06/25/2015 Imaging CT chest- Stable ill-defined left lower lobe nodular opacity measuring 1.5 x 1.6 cm.  Stable mildly enlarged partially calcified mediastinal lymph nodes. No new or progressive disease identified within the thorax.    I personally reviewed and went over laboratory results with the patient.  The results are noted within this dictation.  I personally reviewed and went over radiographic studies with the patient.  The results are noted within this dictation.  CT of chest in August 2016 was stable without any evidence of new or progressive disease.  He denies any complaints today.  Past Medical History  Diagnosis Date  . Anxiety   . Seasonal allergies   . Mentally challenged     mother states mentally "slow"  . Autistic disorder   . 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     is allergic to penicillins.  Current Outpatient Prescriptions on File Prior to Visit  Medication Sig Dispense Refill  . acetaminophen (TYLENOL) 500 MG tablet Take 1,000 mg by mouth every 6 (six) hours as needed for mild pain.     Current Facility-Administered Medications on File Prior to Visit  Medication Dose Route Frequency Provider Last Rate Last Dose  . LORazepam (ATIVAN) injection 0.5 mg  0.5 mg Intravenous Once Farrel Gobble, MD      . sodium chloride 0.9 % injection 10 mL  10 mL Intracatheter PRN Farrel Gobble, MD        Past Surgical History  Procedure Laterality Date  . Orchiectomy Left 08/07/2013    Procedure: RADICAL ORCHIECTOMY;  Surgeon: Marissa Nestle, MD;  Location: [facility];  Service: Urology;  Laterality: Left;  .  Radical orchiectomy Left 08/07/2013  . Portacath placement Right   . Port-a-cath removal Right July 2015    Denies any headaches, dizziness, double vision, fevers, chills, night sweats, nausea, vomiting, diarrhea, constipation, chest pain, heart palpitations, shortness of breath, blood in  stool, black tarry stool, urinary pain, urinary burning, urinary frequency, hematuria.   PHYSICAL EXAMINATION  ECOG PERFORMANCE STATUS: 0 - Asymptomatic  Filed Vitals:   08/04/15 1020  BP: 118/71  Pulse: 88  Temp: 98.2 F (36.8 C)  Resp: 18    GENERAL:alert, no distress, well nourished, well developed, comfortable, cooperative, obese, smiling and accompanied by his mother. SKIN: skin color, texture, turgor are normal, no rashes or significant lesions HEAD: Normocephalic, No masses, lesions, tenderness or abnormalities EYES: normal, PERRLA, EOMI, Conjunctiva are pink and non-injected EARS: External ears normal OROPHARYNX:lips, buccal mucosa, and tongue normal and mucous membranes are moist  NECK: supple, no adenopathy, thyroid normal size, non-tender, without nodularity, no stridor, non-tender, trachea midline LYMPH:  no palpable lymphadenopathy BREAST:not examined LUNGS: clear to auscultation and percussion HEART: regular rate & rhythm, no murmurs, no gallops, S1 normal and S2 normal ABDOMEN:abdomen soft, non-tender, obese and normal bowel sounds BACK: Back symmetric, no curvature. EXTREMITIES:less then 2 second capillary refill, no joint deformities, effusion, or inflammation, no skin discoloration, no cyanosis  NEURO: alert & oriented x 3 with fluent speech, no focal motor/sensory deficits, gait normal    LABORATORY DATA: CBC    Component Value Date/Time   WBC 6.0 03/01/2015 1500   RBC 4.90 03/01/2015 1500   RBC 4.88 10/22/2013 1530   HGB 14.4 03/01/2015 1500   HCT 41.9 03/01/2015 1500   PLT 207 03/01/2015 1500   MCV 85.5 03/01/2015 1500   MCH 29.4 03/01/2015 1500   MCHC 34.4 03/01/2015 1500   RDW 12.3 03/01/2015 1500   LYMPHSABS 1.6 03/01/2015 1500   MONOABS 0.6 03/01/2015 1500   EOSABS 0.1 03/01/2015 1500   BASOSABS 0.0 03/01/2015 1500      Chemistry      Component Value Date/Time   NA 139 07/28/2015 1319   K 4.5 07/28/2015 1319   CL 105 07/28/2015 1319     CO2 29 07/28/2015 1319   BUN 11 07/28/2015 1319   CREATININE 1.07 07/28/2015 1319      Component Value Date/Time   CALCIUM 9.6 07/28/2015 1319   ALKPHOS 83 07/28/2015 1319   AST 25 07/28/2015 1319   ALT 32 07/28/2015 1319   BILITOT 0.6 07/28/2015 1319     Results for Devon, Bailey (MRN [id number]) as of 08/04/2015 11:59  Ref. Range 07/28/2015 13:19  LDH Latest Ref Range: 98-192 U/L 169    Results for Devon, Bailey (MRN [id number]) as of 08/04/2015 11:59  Ref. Range 07/28/2015 13:19  AFP Tumor Marker Latest Ref Range: 0.0-8.3 ng/mL 6.1  Beta hCG, Tumor Marker Latest Ref Range: 0-3 mIU/mL <1    PENDING LABS:   RADIOGRAPHIC STUDIES:  No results found.   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 He received 2 cycles of BEP and 2 cycles of EP due to DLCO abnormalities  Oncology history updated.  Staging by Dr. Barnet Glasgow corrected.  Since treatment, we have been following a pulmonary lesion that has been stable, and proven again to be stable on recent CT imaging.  We will continue with surveillance of this lesion with a repeat CT Chest in  6 months.  CT abd/pelvis in 6 months for surveillance of testicular ca.  Labs in 3 months: CBC diff, CMET, AFP, HCG  Return in 3 months for follow-up.    THERAPY PLAN:  NCCN guidelines surveillance for Stage IA nonseminomatous testicular cancer recommends the following surveillance:  A. Year 1: H+P and markers every 2 months, abdominal/pelvic CT every 6 months, Chest X-ray every 6 months.  2. Year 2: H+P and markers every 3 months, abdominal/pelvic CT annually, chest x-ray every 6 months.  3. Year 3: H+P and markers every 6 months, and chest x-ray annually.   4. Year 4: H+P and markers every 6 months and chest x-ray annually.  5. Year 5: H+P and markers every 6 months.     All questions were answered. The patient knows to call the clinic with any problems, questions or concerns. We can certainly see the patient much sooner if necessary.  Patient and plan discussed with Dr. Ancil Linsey and she is in agreement with the aforementioned.   This note is electronically signed by: Devon Bailey 08/04/2015 12:13 PM

## 2015-07-29 LAB — AFP TUMOR MARKER: AFP-Tumor Marker: 6.1 ng/mL (ref 0.0–8.3)

## 2015-07-29 LAB — BETA HCG QUANT (REF LAB): Beta hCG, Tumor Marker: <1 m[IU]/mL (ref 0–3)

## 2015-07-29 NOTE — Progress Notes (Signed)
Labs drawn

## 2015-08-04 ENCOUNTER — Encounter (HOSPITAL_COMMUNITY): Payer: Self-pay | Admitting: Oncology

## 2015-08-04 ENCOUNTER — Encounter (HOSPITAL_BASED_OUTPATIENT_CLINIC_OR_DEPARTMENT_OTHER): Payer: Self-pay | Admitting: Oncology

## 2015-08-04 VITALS — BP 118/71 | HR 88 | Temp 98.2°F | Resp 18 | Wt 227.3 lb

## 2015-08-04 DIAGNOSIS — C6290 Malignant neoplasm of unspecified testis, unspecified whether descended or undescended: Secondary | ICD-10-CM

## 2015-08-04 NOTE — Patient Instructions (Addendum)
..  [place] at [facility] Discharge Instructions  RECOMMENDATIONS MADE BY THE CONSULTANT AND ANY TEST RESULTS WILL BE SENT TO YOUR REFERRING PHYSICIAN.  Labs in 3 months CT scan in 6 months  Return in 3 months to see Dr. Whitney Muse  Thank you for choosing [place] at [facility] to provide your oncology and hematology care.  To afford each patient quality time with our provider, please arrive at least 15 minutes before your scheduled appointment time.    You need to re-schedule your appointment should you arrive 10 or more minutes late.  We strive to give you quality time with our providers, and arriving late affects you and other patients whose appointments are after yours.  Also, if you no show three or more times for appointments you may be dismissed from the clinic at the providers discretion.     Again, thank you for choosing [facility].  Our hope is that these requests will decrease the amount of time that you wait before being seen by our physicians.       _____________________________________________________________  Should you have questions after your visit to [facility], please contact our office at [phone] between the hours of 8:30 a.m. and 4:30 p.m.  Voicemails left after 4:30 p.m. will not be returned until the following business day.  For prescription refill requests, have your pharmacy contact our office.

## 2015-11-04 ENCOUNTER — Encounter (HOSPITAL_COMMUNITY): Payer: Self-pay | Admitting: Hematology & Oncology

## 2015-11-04 ENCOUNTER — Encounter (HOSPITAL_COMMUNITY): Payer: Self-pay

## 2015-11-04 ENCOUNTER — Encounter (HOSPITAL_COMMUNITY): Payer: Self-pay | Attending: Hematology & Oncology | Admitting: Hematology & Oncology

## 2015-11-04 ENCOUNTER — Encounter (HOSPITAL_BASED_OUTPATIENT_CLINIC_OR_DEPARTMENT_OTHER): Payer: Self-pay

## 2015-11-04 VITALS — BP 132/83 | HR 86 | Temp 98.0°F | Resp 18 | Wt 239.4 lb

## 2015-11-04 DIAGNOSIS — Z23 Encounter for immunization: Secondary | ICD-10-CM

## 2015-11-04 DIAGNOSIS — C781 Secondary malignant neoplasm of mediastinum: Secondary | ICD-10-CM

## 2015-11-04 DIAGNOSIS — Z0000 Encounter for general adult medical examination without abnormal findings: Secondary | ICD-10-CM

## 2015-11-04 DIAGNOSIS — C6290 Malignant neoplasm of unspecified testis, unspecified whether descended or undescended: Secondary | ICD-10-CM

## 2015-11-04 DIAGNOSIS — R918 Other nonspecific abnormal finding of lung field: Secondary | ICD-10-CM

## 2015-11-04 DIAGNOSIS — C6212 Malignant neoplasm of descended left testis: Secondary | ICD-10-CM

## 2015-11-04 DIAGNOSIS — C7800 Secondary malignant neoplasm of unspecified lung: Secondary | ICD-10-CM

## 2015-11-04 DIAGNOSIS — C771 Secondary and unspecified malignant neoplasm of intrathoracic lymph nodes: Secondary | ICD-10-CM

## 2015-11-04 LAB — COMPREHENSIVE METABOLIC PANEL
ALK PHOS: 73 U/L (ref 38–126)
ALT: 30 U/L (ref 17–63)
ANION GAP: 6 (ref 5–15)
AST: 22 U/L (ref 15–41)
Albumin: 4.5 g/dL (ref 3.5–5.0)
BILIRUBIN TOTAL: 0.5 mg/dL (ref 0.3–1.2)
BUN: 16 mg/dL (ref 6–20)
CALCIUM: 9.7 mg/dL (ref 8.9–10.3)
CO2: 29 mmol/L (ref 22–32)
CREATININE: 1 mg/dL (ref 0.61–1.24)
Chloride: 103 mmol/L (ref 101–111)
GFR calc non Af Amer: >60 mL/min (ref >60)
Glucose, Bld: 90 mg/dL (ref 65–99)
Potassium: 4.1 mmol/L (ref 3.5–5.1)
Sodium: 138 mmol/L (ref 135–145)
Total Protein: 7.7 g/dL (ref 6.5–8.1)

## 2015-11-04 LAB — CBC WITH DIFFERENTIAL/PLATELET
Basophils Absolute: 0 10*3/uL (ref 0.0–0.1)
Basophils Relative: 0 %
EOS ABS: 0.1 10*3/uL (ref 0.0–0.7)
Eosinophils Relative: 1 %
HEMATOCRIT: 44.3 % (ref 39.0–52.0)
HEMOGLOBIN: 14.9 g/dL (ref 13.0–17.0)
Lymphocytes Relative: 23 %
Lymphs Abs: 1.4 10*3/uL (ref 0.7–4.0)
MCH: 29.2 pg (ref 26.0–34.0)
MCHC: 33.6 g/dL (ref 30.0–36.0)
MCV: 86.7 fL (ref 78.0–100.0)
MONOS PCT: 11 %
Monocytes Absolute: 0.6 10*3/uL (ref 0.1–1.0)
NEUTROS ABS: 3.8 10*3/uL (ref 1.7–7.7)
Neutrophils Relative %: 65 %
Platelets: 228 10*3/uL (ref 150–400)
RBC: 5.11 MIL/uL (ref 4.22–5.81)
RDW: 12.8 % (ref 11.5–15.5)
WBC: 5.9 10*3/uL (ref 4.0–10.5)

## 2015-11-04 MED ORDER — INFLUENZA VAC SPLIT QUAD 0.5 ML IM SUSY
0.5000 mL | PREFILLED_SYRINGE | Freq: Once | INTRAMUSCULAR | Status: AC
  Administered 2015-11-04: 0.5 mL via INTRAMUSCULAR
  Filled 2015-11-04: qty 0.5

## 2015-11-04 NOTE — Progress Notes (Signed)
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 He received 2 cycles of BEP and 2 cycles of EP due to DLCO abnormalities   SUMMARY OF ONCOLOGIC HISTORY: Oncology History   Patient experienced trauma 1-2 years prior to diagnosis. A Bard enlarging mass until mother discovered it. Underwent left radical orchiectomy on 08/07/2013. No scans or repeat lab tests have been performed until the time he presented at the [facility] for evaluation.     Cancer of testis, non-seminomatous ([place])   08/07/2013 Initial Diagnosis Cancer of testis, non-seminomatous   08/07/2013 Surgery Left radical orchictomy pT2NxMx--mixed with 70% yolk sac and , 30% embryonal carcinoma.  Pre op a-FP =1620 on 07/22/2013  B-HCG=3225   11/24/2013 - 12/29/2013 Chemotherapy BEP 2, discontinued because of decrease in DLCO   01/05/2014 - 01/26/2014 Chemotherapy EP 4 with normalization of beta-hCG and alpha-fetoprotein   06/15/2014 Imaging CT C/A/P Pulmonary lesions within the left lower lobe have decreased in size from previous exam consistent with response to therapy. 2. No new or progressive disease identified.   03/11/2015 Imaging CT CAP- Status post left orchiectomy. New complete resolution of prior left lower lobe mass. Mild residual scarring/opacity measuring 1.5 x 1.6 cm. Mildly prominent mediastinal lymph nodes, indeterminate.No evidence of new/progressive metastatic disease   06/25/2015 Imaging CT chest- Stable ill-defined left lower lobe nodular opacity measuring 1.5 x 1.6 cm. Stable mildly enlarged partially calcified mediastinal lymph nodes. No new or progressive disease identified within the thorax.     CURRENT THERAPY: Observation  INTERVAL HISTORY: SAKAI LEAZER 24 y.o. male returns for follow-up of testicular cancer. He completed chemotherapy in March of last year.  He has no major complaints.  His mother reports he is doing well. His appetite is good. He denies any significant change in his energy level. He has no new fatigue. He denies SOB or cough  Mr. Vigna is accompanied by his mother today. He is feeling fine and eating well. He experiences some discomfort in his right chest wall with exertion or when he breathes in cold air. He previously had some chapped red skin on his hands, however this has resolved. Denies neuropathy.    MEDICAL HISTORY: Past Medical History  Diagnosis Date  . Anxiety   . Seasonal allergies   . Mentally challenged     mother states mentally "slow"  . Autistic disorder   . 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     is allergic to penicillins.  We administered Influenza vac split quadrivalent PF.  SURGICAL HISTORY: Past Surgical History  Procedure Laterality Date  . Orchiectomy Left 08/07/2013    Procedure: RADICAL ORCHIECTOMY;  Surgeon: Marissa Nestle, MD;  Location: [facility];  Service: Urology;  Laterality: Left;  . Radical orchiectomy Left 08/07/2013  . Portacath placement Right   . Port-a-cath removal Right July 2015    SOCIAL HISTORY: Social History   Social History  . Marital Status: Single    Spouse Name: N/A  .  Number of Children: N/A  . Years of Education: N/A   Occupational History  . Not on file.   Social History Main Topics  . Smoking status: Never Smoker   . Smokeless tobacco: Never Used  . Alcohol Use: No  . Drug Use: No  . Sexual Activity: Not on file   Other Topics Concern  . Not on file   Social History Narrative     FAMILY HISTORY: Family History  Problem Relation Age of Onset  . Cancer Maternal Aunt   . Cancer Maternal Aunt     Review of Systems  Constitutional: Negative for fever, chills, weight loss and malaise/fatigue.  HENT: Negative for congestion, hearing loss, nosebleeds, sore throat and tinnitus.   Eyes: Negative for blurred vision, double vision, pain and discharge.  Respiratory: Negative for cough, hemoptysis, sputum production, shortness of breath and wheezing.   Cardiovascular: Positive for chest pain. Negative for palpitations, claudication, leg swelling and PND.       Right chest wall discomfort with exertion or while breathing in cold air. Near previous port site region.  Gastrointestinal: Negative for heartburn, nausea, vomiting, abdominal pain, diarrhea, constipation, blood in stool and melena.  Genitourinary: Negative for dysuria, urgency, frequency and hematuria.  Musculoskeletal: Negative for myalgias, joint pain and falls.  Skin: Negative for itching and rash.  Neurological: Negative for dizziness, tingling, tremors, sensory change, speech change, focal weakness, seizures, loss of consciousness, weakness and headaches.  Endo/Heme/Allergies: Does not bruise/bleed easily.  Psychiatric/Behavioral: Negative for depression, suicidal ideas, memory loss and substance abuse. The patient is not nervous/anxious and does not have insomnia.     PHYSICAL EXAMINATION  ECOG PERFORMANCE STATUS: 0 - Asymptomatic  Filed Vitals:   11/04/15 1300  BP: 132/83  Pulse: 86  Temp: 98 F (36.7 C)  Resp: 18    Physical Exam  Constitutional: He is oriented to person, place, and time and well-developed, well-nourished, and in no distress.  HENT:  Head: Normocephalic and atraumatic.  Nose: Nose normal.  Mouth/Throat: Oropharynx is clear and moist. No oropharyngeal exudate.  Eyes: Conjunctivae and EOM are normal. Pupils are equal, round, and reactive to light. Right eye exhibits no  discharge. Left eye exhibits no discharge. No scleral icterus.  Neck: Normal range of motion. Neck supple. No tracheal deviation present. No thyromegaly present.  Cardiovascular: Normal rate, regular rhythm and normal heart sounds.  Exam reveals no gallop and no friction rub.   No murmur heard. Pulmonary/Chest: Effort normal and breath sounds normal. He has no wheezes. He has no rales.  Abdominal: Soft. Bowel sounds are normal. He exhibits no distension and no mass. There is no tenderness. There is no rebound and no guarding.  Musculoskeletal: Normal range of motion. He exhibits no edema.  Lymphadenopathy:    He has no cervical adenopathy.  Neurological: He is alert and oriented to person, place, and time. He has normal reflexes. No cranial nerve deficit. Gait normal. Coordination normal.  Skin: Skin is warm and dry. No rash noted.  Psychiatric: Mood, memory, affect and judgment normal.  Nursing note and vitals reviewed.   LABORATORY DATA: I have reviewed the data as listed. CBC    Component Value Date/Time   WBC 5.9 11/04/2015 1235   RBC 5.11 11/04/2015 1235   RBC 4.88 10/22/2013 1530   HGB 14.9 11/04/2015 1235   HCT 44.3 11/04/2015 1235   PLT 228 11/04/2015 1235   MCV 86.7 11/04/2015 1235   MCH 29.2 11/04/2015 1235   MCHC 33.6  11/04/2015 1235   RDW 12.8 11/04/2015 1235   LYMPHSABS 1.4 11/04/2015 1235   MONOABS 0.6 11/04/2015 1235   EOSABS 0.1 11/04/2015 1235   BASOSABS 0.0 11/04/2015 1235   CMP     Component Value Date/Time   NA 138 11/04/2015 1235   K 4.1 11/04/2015 1235   CL 103 11/04/2015 1235   CO2 29 11/04/2015 1235   GLUCOSE 90 11/04/2015 1235   BUN 16 11/04/2015 1235   CREATININE 1.00 11/04/2015 1235   CALCIUM 9.7 11/04/2015 1235   PROT 7.7 11/04/2015 1235   ALBUMIN 4.5 11/04/2015 1235   AST 22 11/04/2015 1235   ALT 30 11/04/2015 1235   ALKPHOS 73 11/04/2015 1235   BILITOT 0.5 11/04/2015 1235   GFRNONAA >60 11/04/2015 1235   GFRAA >60 11/04/2015 1235     Results for SIER, RICKLEFS (MRN [id number])   Ref. 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 He received 2 cycles of BEP and 2 cycles of EP due to DLCO abnormalities R Testicular microlithiasis Left pulmonary lower lobe cavitary lesion  LLL mass at diagnosis on PET was 9.0 cm. On review of his records he has a residual LLL mass measuring 1.5 x 1.6 cm. I am repeating imaging if it persists we will send him for resection. I discussed this with the patient and his mother today. They are agreeable. Last set of tumor marker were WNL.  Otherwise, should his scans are WNL, we will continue with observation in accordance with NCCN guidelines.    Mr. Zornes received a flu shot today.  All questions were answered. The patient knows to call the clinic with any problems, questions or concerns. We can certainly see the patient much sooner if necessary.  This document serves as a record of services personally performed by Ancil Linsey, MD. It was created on her behalf by Arlyce Harman, a trained medical scribe. The creation of this record is based on the scribe's personal observations and the provider's statements to them. This document has been checked and approved by the attending provider.  I have reviewed the above documentation for accuracy and completeness, and I agree with the above.  This note was electronically signed. Molli Hazard MD 11/04/2015

## 2015-11-04 NOTE — Progress Notes (Signed)
Devon Bailey Flu vaccine given

## 2015-11-04 NOTE — Patient Instructions (Addendum)
[place] at [facility] Discharge Instructions  RECOMMENDATIONS MADE BY THE CONSULTANT AND ANY TEST RESULTS WILL BE SENT TO YOUR REFERRING PHYSICIAN.   Exam completed by Dr Whitney Muse today CT scan scheduled Flu shot today Lab work today  Return to see the doctor in 4 months with lab work Please call the clinic if you have any questions or concerns    Thank you for choosing [place] at [facility] to provide your oncology and hematology care.  To afford each patient quality time with our provider, please arrive at least 15 minutes before your scheduled appointment time.    You need to re-schedule your appointment should you arrive 10 or more minutes late.  We strive to give you quality time with our providers, and arriving late affects you and other patients whose appointments are after yours.  Also, if you no show three or more times for appointments you may be dismissed from the clinic at the providers discretion.     Again, thank you for choosing [facility].  Our hope is that these requests will decrease the amount of time that you wait before being seen by our physicians.       _____________________________________________________________  Should you have questions after your visit to [facility], please contact our office at [phone] between the hours of 8:30 a.m. and 4:30 p.m.  Voicemails left after 4:30 p.m. will not be returned until the following business day.  For prescription refill requests, have your pharmacy contact our office.

## 2015-11-05 LAB — AFP TUMOR MARKER: AFP TUMOR MARKER: 7.7 ng/mL (ref 0.0–8.3)

## 2015-11-05 LAB — BETA HCG QUANT (REF LAB): Beta hCG, Tumor Marker: <1 m[IU]/mL (ref 0–3)

## 2015-11-05 NOTE — Progress Notes (Signed)
Labs drawn

## 2015-11-10 ENCOUNTER — Ambulatory Visit (HOSPITAL_COMMUNITY)
Admission: RE | Admit: 2015-11-10 | Discharge: 2015-11-10 | Disposition: A | Discharge status: Home / Self Care | Payer: Self-pay | Source: Ambulatory Visit | Attending: Oncology | Admitting: Oncology

## 2015-11-10 DIAGNOSIS — J984 Other disorders of lung: Secondary | ICD-10-CM | POA: Insufficient documentation

## 2015-11-10 DIAGNOSIS — C6290 Malignant neoplasm of unspecified testis, unspecified whether descended or undescended: Secondary | ICD-10-CM | POA: Insufficient documentation

## 2015-11-10 DIAGNOSIS — Z9079 Acquired absence of other genital organ(s): Secondary | ICD-10-CM | POA: Insufficient documentation

## 2015-11-10 DIAGNOSIS — D739 Disease of spleen, unspecified: Secondary | ICD-10-CM | POA: Insufficient documentation

## 2015-11-10 DIAGNOSIS — Z9221 Personal history of antineoplastic chemotherapy: Secondary | ICD-10-CM | POA: Insufficient documentation

## 2015-11-10 DIAGNOSIS — Z923 Personal history of irradiation: Secondary | ICD-10-CM | POA: Insufficient documentation

## 2015-11-10 MED ORDER — IOHEXOL 300 MG/ML  SOLN
100.0000 mL | Freq: Once | INTRAMUSCULAR | Status: AC | PRN
  Administered 2015-11-10: 100 mL via INTRAVENOUS

## 2016-01-24 ENCOUNTER — Other Ambulatory Visit (HOSPITAL_COMMUNITY): Payer: Self-pay

## 2016-03-05 NOTE — Progress Notes (Signed)
No show

## 2016-03-05 NOTE — Assessment & Plan Note (Deleted)
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 He received 2 cycles of BEP and 2 cycles of EP due to DLCO abnormalities (treated by Dr. Barnet Glasgow)  Oncology history updated.  Since treatment, we have been following a pulmonary lesion that has been stable in LLL of lung.  Most recent imaging in December 2016 demonstrates resolution of this 1.5 cm opacity.  According to NCCN guidelines, we are to move on to annual chest xray, but I would like to perform another CT of chest in 6 months for a final follow-up CT imaging of LLL nodule to confirm continued resolution.  If negative, will move on with annual chest imaging with plain films as recommended by NCCN guidelines.  Labs today: CBC diff, CMET, LDH, AFP, Beta-HCG.  Labs in 6 months: CBC diff, CMET, LDH, AFP, Beta-HCG.  Return in 6 months for follow-up, in accordance with year 3 surveillance recommendations by NCCN guidelines.

## 2016-03-06 ENCOUNTER — Ambulatory Visit (HOSPITAL_COMMUNITY): Payer: Self-pay | Admitting: Oncology

## 2016-03-20 NOTE — Assessment & Plan Note (Deleted)
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 He received 2 cycles of BEP and 2 cycles of EP due to DLCO abnormalities (treated by Dr. Barnet Glasgow)  Oncology history updated.  Since treatment, we have been following a pulmonary lesion that has been stable in LLL of lung.  Most recent imaging in December 2016 demonstrates resolution of this 1.5 cm opacity.  According to NCCN guidelines, we are to move on to annual chest xray, but I would like to perform another CT of chest in 6 months for a final follow-up CT imaging of LLL nodule to confirm continued resolution.  If negative, will move on with annual chest imaging with plain films as recommended by NCCN guidelines.  Labs today: CBC diff, CMET, LDH, AFP, Beta-HCG.  Labs in 6 months: CBC diff, CMET, LDH, AFP, Beta-HCG.  Return in 6 months for follow-up, in accordance with year 3 surveillance recommendations by NCCN guidelines.

## 2016-03-20 NOTE — Progress Notes (Signed)
NO SHOW. This encounter was created in error - please disregard.

## 2016-03-21 ENCOUNTER — Ambulatory Visit (HOSPITAL_COMMUNITY): Payer: Self-pay | Admitting: Oncology

## 2016-09-12 ENCOUNTER — Encounter (HOSPITAL_COMMUNITY): Payer: Self-pay | Admitting: Oncology

## 2016-09-12 ENCOUNTER — Encounter (HOSPITAL_COMMUNITY): Payer: Self-pay | Attending: Oncology | Admitting: Oncology

## 2016-09-12 ENCOUNTER — Encounter (HOSPITAL_COMMUNITY): Payer: Self-pay

## 2016-09-12 VITALS — BP 120/72 | HR 77 | Temp 98.3°F | Resp 18 | Wt 232.4 lb

## 2016-09-12 DIAGNOSIS — Z0000 Encounter for general adult medical examination without abnormal findings: Secondary | ICD-10-CM

## 2016-09-12 DIAGNOSIS — Z8547 Personal history of malignant neoplasm of testis: Secondary | ICD-10-CM

## 2016-09-12 DIAGNOSIS — C6212 Malignant neoplasm of descended left testis: Secondary | ICD-10-CM | POA: Insufficient documentation

## 2016-09-12 DIAGNOSIS — Z23 Encounter for immunization: Secondary | ICD-10-CM

## 2016-09-12 DIAGNOSIS — R918 Other nonspecific abnormal finding of lung field: Secondary | ICD-10-CM

## 2016-09-12 LAB — CBC WITH DIFFERENTIAL/PLATELET
Basophils Absolute: 0 10*3/uL (ref 0.0–0.1)
Basophils Relative: 0 %
Eosinophils Absolute: 0.1 10*3/uL (ref 0.0–0.7)
Eosinophils Relative: 3 %
HCT: 39.8 % (ref 39.0–52.0)
Hemoglobin: 13.8 g/dL (ref 13.0–17.0)
Lymphocytes Relative: 28 %
Lymphs Abs: 1.5 10*3/uL (ref 0.7–4.0)
MCH: 29.7 pg (ref 26.0–34.0)
MCHC: 34.7 g/dL (ref 30.0–36.0)
MCV: 85.6 fL (ref 78.0–100.0)
MONOS PCT: 13 %
Monocytes Absolute: 0.7 10*3/uL (ref 0.1–1.0)
NEUTROS PCT: 56 %
Neutro Abs: 3.2 10*3/uL (ref 1.7–7.7)
Platelets: 210 10*3/uL (ref 150–400)
RBC: 4.65 MIL/uL (ref 4.22–5.81)
RDW: 12.7 % (ref 11.5–15.5)
WBC: 5.6 10*3/uL (ref 4.0–10.5)

## 2016-09-12 LAB — COMPREHENSIVE METABOLIC PANEL
ALK PHOS: 73 U/L (ref 38–126)
ALT: 23 U/L (ref 17–63)
AST: 26 U/L (ref 15–41)
Albumin: 4.6 g/dL (ref 3.5–5.0)
Anion gap: 7 (ref 5–15)
BILIRUBIN TOTAL: 0.7 mg/dL (ref 0.3–1.2)
BUN: 14 mg/dL (ref 6–20)
CALCIUM: 9.3 mg/dL (ref 8.9–10.3)
CO2: 24 mmol/L (ref 22–32)
Chloride: 104 mmol/L (ref 101–111)
Creatinine, Ser: 1.01 mg/dL (ref 0.61–1.24)
GFR calc non Af Amer: >60 mL/min (ref >60)
GLUCOSE: 108 mg/dL — AB (ref 65–99)
Potassium: 3.6 mmol/L (ref 3.5–5.1)
Sodium: 135 mmol/L (ref 135–145)
TOTAL PROTEIN: 7.7 g/dL (ref 6.5–8.1)

## 2016-09-12 LAB — LACTATE DEHYDROGENASE: LDH: 144 U/L (ref 98–192)

## 2016-09-12 MED ORDER — INFLUENZA VAC SPLIT QUAD 0.5 ML IM SUSY
0.5000 mL | PREFILLED_SYRINGE | Freq: Once | INTRAMUSCULAR | Status: AC
  Administered 2016-09-12: 0.5 mL via INTRAMUSCULAR

## 2016-09-12 NOTE — Progress Notes (Signed)
Devon Bailey presents today for injection per MD orders. Flu Vaccine administered IM in right Upper Arm. Administration without incident. Patient tolerated well.

## 2016-09-12 NOTE — Progress Notes (Signed)
PROVIDER NOT IN SYSTEM No address on file  Malignant neoplasm of descended left testis [facility]) - Plan: CT Abdomen Pelvis W Contrast, CT Chest W Contrast  Preventative health care - Plan: Influenza vac split quadrivalent PF (FLUARIX) injection 0.5 mL  CURRENT THERAPY: Surveillance per NCCN guidelines  INTERVAL HISTORY: Devon Bailey 25 y.o. 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 He received 2 cycles of BEP and 2 cycles of EP due to DLCO abnormalities  Oncology History   Patient experienced trauma 1-2 years prior to diagnosis. A Bard enlarging mass until mother discovered it. Underwent left radical orchiectomy on 08/07/2013. No scans or repeat lab tests have been performed until the time he presented at the [facility] for evaluation.     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  Pre op a-FP =1620 on 07/22/2013  B-HCG=3225      11/24/2013 - 12/29/2013 Chemotherapy    BEP 2, discontinued because of decrease in DLCO      01/05/2014 - 01/26/2014 Chemotherapy    EP 4 with normalization of beta-hCG and alpha-fetoprotein      06/15/2014 Imaging    CT C/A/P Pulmonary lesions within the left lower lobe have decreased in size from previous exam consistent with response to therapy. 2. No new or progressive disease identified.      03/11/2015 Imaging    CT CAP- Status post left orchiectomy. New complete resolution of prior left lower lobe mass. Mild residual scarring/opacity measuring 1.5 x 1.6 cm. Mildly prominent mediastinal lymph nodes, indeterminate.No evidence of new/progressive metastatic disease      06/25/2015 Imaging    CT chest- Stable ill-defined left lower lobe nodular opacity  measuring 1.5 x 1.6 cm. Stable mildly enlarged partially calcified mediastinal lymph nodes. No new or progressive disease identified within the thorax.      11/10/2015 Imaging    CT CAP- No evidence of metastatic disease. Scarring in the left lower lobe at the site of previous metastasis      He notes intermittent chest pain at the site of his previous port-a-cath.  He notes that it comes and goes, no pattern, no radiation of pain, no shortness of breath, no sweating.  He notes that it has been ongoing for months- years.  He is doing self testicular exams and he denies any new changes or abnormalities.  Review of Systems  Constitutional: Negative.  Negative for chills, fever, malaise/fatigue and weight loss.  HENT: Negative.   Eyes: Negative.  Negative for blurred vision and double vision.  Respiratory: Negative.  Negative for cough, hemoptysis, sputum production, shortness of breath and wheezing.   Cardiovascular: Positive for chest pain.  Gastrointestinal: Negative.  Negative for abdominal pain, constipation, diarrhea, nausea and vomiting.  Genitourinary: Negative.  Negative for dysuria.  Musculoskeletal: Negative.   Skin: Negative.   Neurological: Negative.  Negative for weakness and headaches.  Endo/Heme/Allergies: Negative.   Psychiatric/Behavioral: The patient is nervous/anxious.     Past Medical History:  Diagnosis Date  . Anxiety   . Autistic disorder   . Cancer ([place])    testicular  . Mentally challenged    mother states mentally "slow"  . Seasonal allergies     Past  Surgical History:  Procedure Laterality Date  . ORCHIECTOMY Left 08/07/2013   Procedure: RADICAL ORCHIECTOMY;  Surgeon: Marissa Nestle, MD;  Location: [facility];  Service: Urology;  Laterality: Left;  . PORT-A-CATH REMOVAL Right July 2015  . PORTACATH PLACEMENT Right   . RADICAL ORCHIECTOMY Left 08/07/2013    Family History  Problem Relation Age of Onset  . Cancer Maternal Aunt   . Cancer Maternal  Aunt     Social History   Social History  . Marital status: Single    Spouse name: N/A  . Number of children: N/A  . Years of education: N/A   Social History Main Topics  . Smoking status: Never Smoker  . Smokeless tobacco: Never Used  . Alcohol use No  . Drug use: No  . Sexual activity: Not Asked   Other Topics Concern  . None   Social History Narrative  . None     PHYSICAL EXAMINATION  ECOG PERFORMANCE STATUS: 1 - Symptomatic but completely ambulatory  Vitals:   09/12/16 1446  BP: 120/72  Pulse: 77  Resp: 18  Temp: 98.3 F (36.8 C)    GENERAL:alert, no distress, well nourished, well developed, comfortable, cooperative, obese, smiling and accompanied by mother. SKIN: skin color, texture, turgor are normal, no rashes or significant lesions HEAD: Normocephalic, No masses, lesions, tenderness or abnormalities EYES: normal, EOMI, Conjunctiva are pink and non-injected EARS: External ears normal OROPHARYNX:lips, buccal mucosa, and tongue normal and mucous membranes are moist  NECK: supple, no adenopathy, trachea midline LYMPH:  no palpable lymphadenopathy, no hepatosplenomegaly BREAST:not examined LUNGS: clear to auscultation and percussion HEART: regular rate & rhythm, no murmurs, no gallops, S1 normal and S2 normal ABDOMEN:abdomen soft, non-tender, obese, normal bowel sounds and no masses or organomegaly BACK: Back symmetric, no curvature., No CVA tenderness EXTREMITIES:less then 2 second capillary refill, no joint deformities, effusion, or inflammation, no edema, no skin discoloration, no clubbing, no cyanosis  NEURO: alert & oriented x 3 with fluent speech, no focal motor/sensory deficits, gait normal TESTICULAR: Right testicle without mass or lesion, left testicle surgically absent.   LABORATORY DATA: CBC    Component Value Date/Time   WBC 5.6 09/12/2016 1614   RBC 4.65 09/12/2016 1614   HGB 13.8 09/12/2016 1614   HCT 39.8 09/12/2016 1614   PLT 210  09/12/2016 1614   MCV 85.6 09/12/2016 1614   MCH 29.7 09/12/2016 1614   MCHC 34.7 09/12/2016 1614   RDW 12.7 09/12/2016 1614   LYMPHSABS 1.5 09/12/2016 1614   MONOABS 0.7 09/12/2016 1614   EOSABS 0.1 09/12/2016 1614   BASOSABS 0.0 09/12/2016 1614      Chemistry      Component Value Date/Time   NA 135 09/12/2016 1614   K 3.6 09/12/2016 1614   CL 104 09/12/2016 1614   CO2 24 09/12/2016 1614   BUN 14 09/12/2016 1614   CREATININE 1.01 09/12/2016 1614      Component Value Date/Time   CALCIUM 9.3 09/12/2016 1614   ALKPHOS 73 09/12/2016 1614   AST 26 09/12/2016 1614   ALT 23 09/12/2016 1614   BILITOT 0.7 09/12/2016 1614        PENDING LABS:   RADIOGRAPHIC STUDIES:  No results found.   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 He received 2 cycles of BEP and 2 cycles of EP due to DLCO abnormalities  Oncology  history updated.  Labs today: CBC diff, CMET, AFP, HCG  Final CT imaging of Abd/pelvis in December 2017.  Will add CT of chest to follow-up on pulmonary nodules.  Influenza vaccine given today.  Chest pain is sounds musculoskeletal/neurologic in nature secondary to port placement.  I have recommended OTC Tylenol/Ibuprofen/Aleve for this discomfort.    Return in ~ 2-3 months for follow-up and review of labs and scan results.  If all is well at that time, then labs and follow-up every 6 months per NCCN guidelines would be appropriate with annual chest xray.   ORDERS PLACED FOR THIS ENCOUNTER: Orders Placed This Encounter  Procedures  . CT Abdomen Pelvis W Contrast  . CT Chest W Contrast    MEDICATIONS PRESCRIBED THIS ENCOUNTER: Meds ordered this encounter  Medications  . Influenza vac split quadrivalent PF (FLUARIX) injection 0.5 mL    THERAPY PLAN:  NCCN guidelines surveillance for Stage  II-III nonseminomatous testicular cancer recommends the following surveillance (2.2017):  A. Year 1: H+P and markers every 2 months, abdominal/pelvic CT every 6 months, Chest X-ray every 6 months.  2. Year 2: H+P and markers every 3 months, abdominal/pelvic CT annually, chest x-ray every 6 months.  3. Year 3: H+P and markers every 6 months, and chest x-ray annually.   4. Year 4: H+P and markers every 6 months and chest x-ray annually.  5. Year 5: H+P and markers every 6 months.    All questions were answered. The patient knows to call the clinic with any problems, questions or concerns. We can certainly see the patient much sooner if necessary.  Patient and plan discussed with Dr. Ancil Linsey and she is in agreement with the aforementioned.   This note is electronically signed by: Doy Mince 09/12/2016 5:56 PM

## 2016-09-12 NOTE — Assessment & Plan Note (Addendum)
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 He received 2 cycles of BEP and 2 cycles of EP due to DLCO abnormalities  Oncology history updated.  Labs today: CBC diff, CMET, AFP, HCG  Final CT imaging of Abd/pelvis in December 2017.  Will add CT of chest to follow-up on pulmonary nodules.  Influenza vaccine given today.  Chest pain is sounds musculoskeletal/neurologic in nature secondary to port placement.  I have recommended OTC Tylenol/Ibuprofen/Aleve for this discomfort.    Return in ~ 2-3 months for follow-up and review of labs and scan results.  If all is well at that time, then labs and follow-up every 6 months per NCCN guidelines would be appropriate with annual chest xray.

## 2016-09-12 NOTE — Patient Instructions (Addendum)
[place] at [facility] Discharge Instructions  RECOMMENDATIONS MADE BY THE CONSULTANT AND ANY TEST RESULTS WILL BE SENT TO YOUR REFERRING PHYSICIAN.  You saw Kirby Crigler, PA-C, today. Labs today. CT scans in December 2017 Follow up after scans. You had flu shot today.  Thank you for choosing [place] at [facility] to provide your oncology and hematology care.  To afford each patient quality time with our provider, please arrive at least 15 minutes before your scheduled appointment time.   Beginning January 23rd 2017 lab work for the [facility] will be done in the  Main lab at [facility] on 1st floor. If you have a lab appointment with the [facility] please come in thru the  Main Entrance and check in at the main information desk  You need to re-schedule your appointment should you arrive 10 or more minutes late.  We strive to give you quality time with our providers, and arriving late affects you and other patients whose appointments are after yours.  Also, if you no show three or more times for appointments you may be dismissed from the clinic at the providers discretion.     Again, thank you for choosing [facility].  Our hope is that these requests will decrease the amount of time that you wait before being seen by our physicians.       _____________________________________________________________  Should you have questions after your visit to [facility], please contact our office at [phone] between the hours of 8:30 a.m. and 4:30 p.m.  Voicemails left after 4:30 p.m. will not be returned until the following business day.  For prescription refill requests, have your pharmacy contact our office.         Resources For Cancer Patients and their Caregivers ? American Cancer Society: Can assist with transportation, wigs, general needs, runs Look Good Feel Better.        (901)385-0475 ? Cancer  Care: Provides financial assistance, online support groups, medication/co-pay assistance.  1-800-813-HOPE 4700984143) ? [place] Assists [place] cancer patients and their families through emotional , educational and financial support.  863 789 8958 ? [place] DSS Where to apply for food stamps, Medicaid and utility assistance. (712)802-5764 ? RCATS: Transportation to medical appointments. 858-680-1624 ? Social Security Administration: May apply for disability if have a Stage IV cancer. 563-171-3915 786-650-6748 ? [employer], Disability and Transit Services: Assists with nutrition, care and transit needs. [place] Support Programs: @10RELATIVEDAYS @ > Cancer Support Group  2nd Tuesday of the month 1pm-2pm, Journey Room  > Creative Journey  3rd Tuesday of the month 1130am-1pm, Journey Room  > Look Good Feel Better  1st Wednesday of the month 10am-12 noon, Journey Room (Call American Cancer Society to register [phone])   Influenza Virus Vaccine injection (Fluarix) What is this medicine? INFLUENZA VIRUS VACCINE (in floo EN zuh VAHY ruhs vak SEEN) helps to reduce the risk of getting influenza also known as the flu. This medicine may be used for other purposes; ask your health care provider or pharmacist if you have questions. What should I tell my health care provider before I take this medicine? They need to know if you have any of these conditions: -bleeding disorder like hemophilia -fever or infection -Guillain-Barre syndrome or other neurological problems -immune system problems -infection with the human immunodeficiency virus (HIV) or AIDS -low blood platelet counts -multiple sclerosis -an unusual or  allergic reaction to influenza virus vaccine, eggs, chicken proteins, latex, gentamicin, other medicines, foods, dyes or preservatives -pregnant or trying to get pregnant -breast-feeding How should I use this  medicine? This vaccine is for injection into a muscle. It is given by a health care professional. A copy of Vaccine Information Statements will be given before each vaccination. Read this sheet carefully each time. The sheet may change frequently. Talk to your pediatrician regarding the use of this medicine in children. Special care may be needed. Overdosage: If you think you have taken too much of this medicine contact a poison control center or emergency room at once. NOTE: This medicine is only for you. Do not share this medicine with others. What if I miss a dose? This does not apply. What may interact with this medicine? -chemotherapy or radiation therapy -medicines that lower your immune system like etanercept, anakinra, infliximab, and adalimumab -medicines that treat or prevent blood clots like warfarin -phenytoin -steroid medicines like prednisone or cortisone -theophylline -vaccines This list may not describe all possible interactions. Give your health care provider a list of all the medicines, herbs, non-prescription drugs, or dietary supplements you use. Also tell them if you smoke, drink alcohol, or use illegal drugs. Some items may interact with your medicine. What should I watch for while using this medicine? Report any side effects that do not go away within 3 days to your doctor or health care professional. Call your health care provider if any unusual symptoms occur within 6 weeks of receiving this vaccine. You may still catch the flu, but the illness is not usually as bad. You cannot get the flu from the vaccine. The vaccine will not protect against colds or other illnesses that may cause fever. The vaccine is needed every year. What side effects may I notice from receiving this medicine? Side effects that you should report to your doctor or health care professional as soon as possible: -allergic reactions like skin rash, itching or hives, swelling of the face, lips, or  tongue Side effects that usually do not require medical attention (report to your doctor or health care professional if they continue or are bothersome): -fever -headache -muscle aches and pains -pain, tenderness, redness, or swelling at site where injected -weak or tired This list may not describe all possible side effects. Call your doctor for medical advice about side effects. You may report side effects to FDA at 1-800-FDA-1088. Where should I keep my medicine? This vaccine is only given in a clinic, pharmacy, doctor's office, or other health care setting and will not be stored at home. NOTE: This sheet is a summary. It may not cover all possible information. If you have questions about this medicine, talk to your doctor, pharmacist, or health care provider.    2016, Elsevier/Gold Standard. (2008-06-03 09:30:40)

## 2016-09-13 LAB — AFP TUMOR MARKER: AFP-Tumor Marker: 6.2 ng/mL (ref 0.0–8.3)

## 2016-09-13 LAB — BETA HCG QUANT (REF LAB)

## 2016-11-08 ENCOUNTER — Ambulatory Visit (HOSPITAL_COMMUNITY)
Admission: RE | Admit: 2016-11-08 | Discharge: 2016-11-08 | Disposition: A | Discharge status: Home / Self Care | Payer: Self-pay | Source: Ambulatory Visit | Attending: Oncology | Admitting: Oncology

## 2016-11-08 DIAGNOSIS — C6212 Malignant neoplasm of descended left testis: Secondary | ICD-10-CM | POA: Insufficient documentation

## 2016-11-08 MED ORDER — IOPAMIDOL (ISOVUE-300) INJECTION 61%
100.0000 mL | Freq: Once | INTRAVENOUS | Status: AC | PRN
Start: 2016-11-08 — End: 2016-11-08
  Administered 2016-11-08: 100 mL via INTRAVENOUS

## 2016-11-09 ENCOUNTER — Encounter (HOSPITAL_COMMUNITY): Payer: Self-pay | Attending: Oncology | Admitting: Adult Health

## 2016-11-09 ENCOUNTER — Encounter (HOSPITAL_COMMUNITY): Payer: Self-pay | Admitting: Adult Health

## 2016-11-09 VITALS — BP 105/72 | HR 85 | Temp 98.0°F | Resp 16 | Wt 237.4 lb

## 2016-11-09 DIAGNOSIS — Z8547 Personal history of malignant neoplasm of testis: Secondary | ICD-10-CM

## 2016-11-09 DIAGNOSIS — C6212 Malignant neoplasm of descended left testis: Secondary | ICD-10-CM | POA: Insufficient documentation

## 2016-11-09 NOTE — Patient Instructions (Addendum)
[place] at [facility] Discharge Instructions  RECOMMENDATIONS MADE BY THE CONSULTANT AND ANY TEST RESULTS WILL BE SENT TO YOUR REFERRING PHYSICIAN.  You saw Mike Craze, NP, today Follow up in 6 months with labs and CXR. See Amy at checkout for appointments.  Thank you for choosing [place] at [facility] to provide your oncology and hematology care.  To afford each patient quality time with our provider, please arrive at least 15 minutes before your scheduled appointment time.   Beginning January 23rd 2017 lab work for the [facility] will be done in the  Main lab at [facility] on 1st floor. If you have a lab appointment with the [place] please come in thru the  Main Entrance and check in at the main information desk  You need to re-schedule your appointment should you arrive 10 or more minutes late.  We strive to give you quality time with our providers, and arriving late affects you and other patients whose appointments are after yours.  Also, if you no show three or more times for appointments you may be dismissed from the clinic at the providers discretion.     Again, thank you for choosing [facility].  Our hope is that these requests will decrease the amount of time that you wait before being seen by our physicians.       _____________________________________________________________  Should you have questions after your visit to [facility], please contact our office at [phone] between the hours of 8:30 a.m. and 4:30 p.m.  Voicemails left after 4:30 p.m. will not be returned until the following business day.  For prescription refill requests, have your pharmacy contact our office.         Resources For Cancer Patients and their Caregivers ? American Cancer Society: Can assist with transportation, wigs, general needs, runs Look Good Feel Better.        718-508-4404 ? Cancer  Care: Provides financial assistance, online support groups, medication/co-pay assistance.  1-800-813-HOPE (502)420-7400) ? [place] Assists [place] cancer patients and their families through emotional , educational and financial support.  782-686-6690 ? [place] DSS Where to apply for food stamps, Medicaid and utility assistance. 4153306766 ? RCATS: Transportation to medical appointments. (616)194-4988 ? Social Security Administration: May apply for disability if have a Stage IV cancer. ([phone] 670-376-8036 ? [employer], Disability and Transit Services: Assists with nutrition, care and transit needs. [place] Support Programs: @10RELATIVEDAYS @ > Cancer Support Group  2nd Tuesday of the month 1pm-2pm, Journey Room  > Creative Journey  3rd Tuesday of the month 1130am-1pm, Journey Room  > Look Good Feel Better  1st Wednesday of the month 10am-12 noon, Journey Room (Call [facility] to register [phone])

## 2016-11-09 NOTE — Progress Notes (Signed)
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 He received 2 cycles of BEP and 2 cycles of EP due to DLCO abnormalities  Oncology History   Patient experienced trauma 1-2 years prior to diagnosis. A Bard enlarging mass until mother discovered it. Underwent left radical orchiectomy on 08/07/2013. No scans or repeat lab tests have been performed until the time he presented at the [facility] for evaluation.     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  Pre op a-FP =1620 on 07/22/2013  B-HCG=3225      11/24/2013 - 12/29/2013 Chemotherapy    BEP 2, discontinued because of decrease in DLCO      01/05/2014 - 01/26/2014 Chemotherapy    EP 4 with normalization of beta-hCG and alpha-fetoprotein      06/15/2014 Imaging    CT C/A/P Pulmonary lesions within the left lower lobe have decreased in size from previous exam consistent with response to therapy. 2. No new or progressive disease identified.      03/11/2015 Imaging    CT CAP- Status post left orchiectomy. New complete resolution of prior left lower lobe mass. Mild residual scarring/opacity measuring 1.5 x 1.6 cm. Mildly prominent mediastinal lymph nodes, indeterminate.No evidence of new/progressive metastatic disease      06/25/2015 Imaging    CT chest- Stable ill-defined left lower lobe nodular opacity measuring 1.5 x 1.6 cm. Stable mildly enlarged partially calcified mediastinal lymph nodes. No new or progressive disease identified within the  thorax.      11/10/2015 Imaging    CT CAP- No evidence of metastatic disease. Scarring in the left lower lobe at the site of previous metastasis      11/08/2016 Imaging    CT CAP- Stable exam. No evidence of metastatic disease or other acute findings within the chest, abdomen, or pelvis.      Mr. Devon Bailey returns to the [place] today accompanied by his mother.  He reports intermittent pain in his right upper chest around port site scar. This pain is worse after exercise which involves the chest muscles. He also reports shocks of pain to the left upper chest. His mother attributes this pain to increase in exercise. His mother speaks about the exercise regimen which includes increasing resistance training exercises every 6 weeks. He is requesting pain medication; his mother will give him Tylenol/Ibuprofen, an ice pack, or heat pack for pain. He denies any other pain.  He reports diarrhea secondary to drinking contrast with CT scans. Diarrhea has since resolved.  He denies any other bowel issues.   He denies urinary issues. He denies any abnormal bleeding or cough.   He reports shortness of breath with exercise and when around cigarette smoke. Otherwise, no dyspnea. He also reports tightness in the chest when breathing in cold air.   He has gained 5 lbs since 08/04/15.  He receives primary care with [facility].  He is here today to review recent CT imaging studies and continued surveillance.   Review of Systems  Constitutional: Negative.  Negative for chills, fever, malaise/fatigue and weight loss.  HENT: Negative.   Eyes: Negative.  Negative for blurred vision and double vision.  Respiratory: Negative.  Negative for cough, hemoptysis, sputum production, shortness of breath and wheezing.   Cardiovascular: Positive for chest pain.       Intermittent right upper chest pain at port-a-cath scar. Intermittent 'shocking' pain in left upper chest pain associated  with exercise  Gastrointestinal: Positive for diarrhea (secondary to drinking contrast with CT scans). Negative for abdominal pain, constipation, nausea and vomiting.  Genitourinary: Negative.  Negative for dysuria.  Musculoskeletal: Negative.   Skin: Negative.   Neurological: Negative.  Negative for weakness and headaches.  Endo/Heme/Allergies: Negative.   Psychiatric/Behavioral: The patient is nervous/anxious.     Past Medical History:  Diagnosis Date  . Anxiety   . Autistic disorder   . Cancer ([place])    testicular  . Mentally challenged    mother states mentally "slow"  . Seasonal allergies     Past Surgical History:  Procedure Laterality Date  . ORCHIECTOMY Left 08/07/2013   Procedure: RADICAL ORCHIECTOMY;  Surgeon: Marissa Nestle, MD;  Location: [facility];  Service: Urology;  Laterality: Left;  . PORT-A-CATH REMOVAL Right July 2015  . PORTACATH PLACEMENT Right   . RADICAL ORCHIECTOMY Left 08/07/2013    Family History  Problem Relation Age of Onset  . Cancer Maternal Aunt   . Cancer Maternal Aunt     Social History   Social History  . Marital status: Single    Spouse name: N/A  . Number of children: N/A  . Years of education: N/A   Social History Main Topics  . Smoking status: Never Smoker  . Smokeless tobacco: Never Used  . Alcohol use No  . Drug use: No  . Sexual activity: Not Asked   Other Topics Concern  . None   Social History Narrative  . None     PHYSICAL EXAMINATION  ECOG PERFORMANCE STATUS: 1 - Symptomatic but completely ambulatory  Vitals:   11/09/16 1342  BP: 105/72  Pulse: 85  Resp: 16  Temp: 98 F (36.7 C)   General: Male in no acute distress.  Accompanied by by his mother.   HEENT: Head is normocephalic.  Pupils equal and reactive to light. Conjunctivae clear without exudate.  Sclerae anicteric. Oral mucosa is pink and moist without lesions. Oropharynx is pink and moist without lesions. Lymph: No cervical, supraclavicular,  infraclavicular, or axillary lymphadenopathy noted on palpation.   Cardiovascular: Normal rate and rhythm. Respiratory: Clear to auscultation bilaterally. Chest expansion symmetric without accessory muscle use. Breathing non-labored.    GU: Testicular exam performed by Kirby Crigler, PA-C (per patient wishes); reportedly no palpable nodularity or masses to right testicle; no abnormal findings. Left testicle surgically absent.  GI: Soft, non-tender abdomen. Normoactive bowel sounds.  Neuro: No focal deficits. Steady gait.   Psych: Normal mood and affect for situation. Extremities: No edema, cyanosis, or clubbing.   Skin: Warm and dry. Right upper chest with healed scar s/p port-a-cath removal; slightly tender to palpation. Left upper chest also slightly tender to palpation at pectoralis muscle (correlates with mother's report that his pain occurs with increased weight lighting/training exercises).      LABORATORY DATA: I have reviewed the data as listed. CBC    Component Value Date/Time   WBC 5.6 09/12/2016 1614   RBC 4.65 09/12/2016 1614   HGB 13.8 09/12/2016 1614   HCT 39.8 09/12/2016 1614  PLT 210 09/12/2016 1614   MCV 85.6 09/12/2016 1614   MCH 29.7 09/12/2016 1614   MCHC 34.7 09/12/2016 1614   RDW 12.7 09/12/2016 1614   LYMPHSABS 1.5 09/12/2016 1614   MONOABS 0.7 09/12/2016 1614   EOSABS 0.1 09/12/2016 1614   BASOSABS 0.0 09/12/2016 1614      Chemistry      Component Value Date/Time   NA 135 09/12/2016 1614   K 3.6 09/12/2016 1614   CL 104 09/12/2016 1614   CO2 24 09/12/2016 1614   BUN 14 09/12/2016 1614   CREATININE 1.01 09/12/2016 1614      Component Value Date/Time   CALCIUM 9.3 09/12/2016 1614   ALKPHOS 73 09/12/2016 1614   AST 26 09/12/2016 1614   ALT 23 09/12/2016 1614   BILITOT 0.7 09/12/2016 1614      PENDING LABS: None.   RADIOGRAPHIC STUDIES: I have personally reviewed the radiological images as listed and agreed with the findings in the  report.  CT chest/abd/pelvis: 11/08/16     PATHOLOGY:    ASSESSMENT AND PLAN:  Devon Bailey is a pleasant 25 y.o. 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 He received 2 cycles of BEP and 2 cycles of EP due to DLCO abnormalities  Left testicular cancer:  Restaging CT chest/abd/pelvis from 11/08/16 reviewed in detail with patient and his mother.  Stable exam with no evidence of metastatic disease or other acute findings within the chest, abdomen, or pelvis. Since his surveillance CT scan and physical exam are negative, he will follow-up at the cancer center in 6 months for continued surveillance (based on NCCN guidelines) with AFP and beta HCG tumor markers.    We discussed that NCCN also recommends annual chest x-ray. I will place orders for him to have the x-ray completed before his next visit in 6 months.  Going forward, his annual CXR will be due in June each year.    Intermittent chest pain:  I have low suspicion this is cardiac in origin. The pain is reproducible, which is encouraging and more likely supports a musculoskeletal etiology.  Encouraged him to let us know if the pain does not improve and we can certainly place referral to cardiology, if needed.  Vital signs are stable and cardiac exam benign.  I do not think referral  is necessary at this time.    Dispo:  -Return to cancer center for continued surveillance in 6 months labs and exam; he will get CXR as well in 6 months before this visit.    ORDERS PLACED FOR THIS ENCOUNTER: No orders of the defined types were placed in this encounter.   MEDICATIONS PRESCRIBED THIS ENCOUNTER: No orders of the defined types were placed in this encounter.   THERAPY PLAN:  NCCN guidelines surveillance for Stage II-III nonseminomatous testicular cancer recommends the following surveillance (2.2017):  A. Year 1: H+P and markers  every 2 months, abdominal/pelvic CT every 6 months, Chest X-ray every 6 months.  2. Year 2: H+P and markers every 3 months, abdominal/pelvic CT annually, chest x-ray every 6 months.  3. Year 3: H+P and markers every 6 months, and chest x-ray annually.   4. Year 4: H+P and markers every 6 months and chest x-ray annually.  5. Year 5: H+P and markers every 6 months.    All questions were answered. The patient knows to call the clinic with any problems, questions or concerns. We can  certainly see the patient much sooner if necessary.  This document serves as a record of services personally performed by Mike Craze NP. It was created on her behalf by Arlyce Harman, a trained medical scribe. The creation of this record is based on the scribe's personal observations and the provider's statements to them. This document has been checked and approved by the attending provider.  Patient and plan discussed with Dr. Ancil Linsey and she is in agreement with the aforementioned.    A total of 35 minutes was spent in the face-to-face care with this patient with greater than 50% of this time spent in counseling and care-coordination.   Mike Craze, NP [place] [phone]

## 2017-05-10 ENCOUNTER — Encounter (HOSPITAL_COMMUNITY): Payer: Self-pay | Admitting: Oncology

## 2017-05-10 ENCOUNTER — Encounter (HOSPITAL_COMMUNITY): Payer: Self-pay | Attending: Oncology

## 2017-05-10 ENCOUNTER — Ambulatory Visit (HOSPITAL_COMMUNITY): Payer: Self-pay

## 2017-05-10 ENCOUNTER — Encounter (HOSPITAL_BASED_OUTPATIENT_CLINIC_OR_DEPARTMENT_OTHER): Payer: Self-pay | Admitting: Oncology

## 2017-05-10 VITALS — BP 113/76 | HR 89 | Temp 98.3°F | Resp 16 | Wt 245.6 lb

## 2017-05-10 DIAGNOSIS — C6290 Malignant neoplasm of unspecified testis, unspecified whether descended or undescended: Secondary | ICD-10-CM

## 2017-05-10 DIAGNOSIS — Z8547 Personal history of malignant neoplasm of testis: Secondary | ICD-10-CM

## 2017-05-10 DIAGNOSIS — C6212 Malignant neoplasm of descended left testis: Secondary | ICD-10-CM

## 2017-05-10 LAB — CBC WITH DIFFERENTIAL/PLATELET
BASOS ABS: 0 10*3/uL (ref 0.0–0.1)
Basophils Relative: 0 %
EOS PCT: 2 %
Eosinophils Absolute: 0.1 10*3/uL (ref 0.0–0.7)
HEMATOCRIT: 41.3 % (ref 39.0–52.0)
Hemoglobin: 14 g/dL (ref 13.0–17.0)
LYMPHS ABS: 1.5 10*3/uL (ref 0.7–4.0)
LYMPHS PCT: 26 %
MCH: 29 pg (ref 26.0–34.0)
MCHC: 33.9 g/dL (ref 30.0–36.0)
MCV: 85.5 fL (ref 78.0–100.0)
MONO ABS: 0.6 10*3/uL (ref 0.1–1.0)
MONOS PCT: 10 %
NEUTROS ABS: 3.7 10*3/uL (ref 1.7–7.7)
Neutrophils Relative %: 62 %
PLATELETS: 189 10*3/uL (ref 150–400)
RBC: 4.83 MIL/uL (ref 4.22–5.81)
RDW: 12.9 % (ref 11.5–15.5)
WBC: 6 10*3/uL (ref 4.0–10.5)

## 2017-05-10 LAB — COMPREHENSIVE METABOLIC PANEL
ALT: 29 U/L (ref 17–63)
AST: 24 U/L (ref 15–41)
Albumin: 4.3 g/dL (ref 3.5–5.0)
Alkaline Phosphatase: 81 U/L (ref 38–126)
Anion gap: 9 (ref 5–15)
BILIRUBIN TOTAL: 0.5 mg/dL (ref 0.3–1.2)
BUN: 16 mg/dL (ref 6–20)
CHLORIDE: 105 mmol/L (ref 101–111)
CO2: 26 mmol/L (ref 22–32)
Calcium: 9.4 mg/dL (ref 8.9–10.3)
Creatinine, Ser: 0.99 mg/dL (ref 0.61–1.24)
GFR calc non Af Amer: >60 mL/min (ref >60)
Glucose, Bld: 109 mg/dL — ABNORMAL HIGH (ref 65–99)
POTASSIUM: 3.6 mmol/L (ref 3.5–5.1)
Sodium: 140 mmol/L (ref 135–145)
TOTAL PROTEIN: 7.4 g/dL (ref 6.5–8.1)

## 2017-05-10 NOTE — Progress Notes (Signed)
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 He received 2 cycles of BEP and 2 cycles of EP due to DLCO abnormalities  Oncology History   Patient experienced trauma 1-2 years prior to diagnosis. A Bard enlarging mass until mother discovered it. Underwent left radical orchiectomy on 08/07/2013. No scans or repeat lab tests have been performed until the time he presented at the [facility] for evaluation.     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  Pre op a-FP =1620 on 07/22/2013  B-HCG=3225      11/24/2013 - 12/29/2013 Chemotherapy    BEP 2, discontinued because of decrease in DLCO      01/05/2014 - 01/26/2014 Chemotherapy    EP 4 with normalization of beta-hCG and alpha-fetoprotein      06/15/2014 Imaging    CT C/A/P Pulmonary lesions within the left lower lobe have decreased in size from previous exam consistent with response to therapy. 2. No new or progressive disease identified.      03/11/2015 Imaging    CT CAP- Status post left orchiectomy. New complete resolution of prior left lower lobe mass. Mild residual scarring/opacity measuring 1.5 x 1.6 cm. Mildly prominent mediastinal lymph nodes, indeterminate.No evidence of new/progressive metastatic disease      06/25/2015 Imaging    CT chest- Stable ill-defined left lower lobe nodular opacity measuring 1.5 x 1.6 cm. Stable mildly enlarged partially calcified mediastinal lymph nodes. No new or progressive disease identified within the  thorax.      11/10/2015 Imaging    CT CAP- No evidence of metastatic disease. Scarring in the left lower lobe at the site of previous metastasis      11/08/2016 Imaging    CT CAP- Stable exam. No evidence of metastatic disease or other acute findings within the chest, abdomen, or pelvis.      Devon Bailey returns to the [facility] today accompanied by his mother.  Patient states that overall he has been doing well. He has been working in the garden with his mother states that he gets intermittent chest pain when he exerts himself. Otherwise he denies palpating any inguinal lymphadenopathy. He denies any unexplained weight loss, night sweats, fevers or chills.  Review of Systems  Constitutional: Negative.  Negative for chills, fever, malaise/fatigue and weight loss.  HENT: Negative.  Negative for hearing loss, sore throat and tinnitus.   Eyes: Negative.  Negative for blurred vision, double vision, photophobia and discharge.  Respiratory: Negative.  Negative for cough, hemoptysis, sputum production, shortness of breath and wheezing.   Cardiovascular: Negative for chest pain, palpitations, orthopnea, claudication and leg swelling.       Occasional exertional CP  Gastrointestinal: Negative for abdominal pain, constipation, diarrhea, melena, nausea and vomiting.  Genitourinary: Negative.  Negative for dysuria and hematuria.  Musculoskeletal: Negative.  Negative for back pain, joint pain and myalgias.  Skin: Negative.  Negative for itching and rash.  Neurological: Negative.  Negative for dizziness, weakness and headaches.  Endo/Heme/Allergies: Negative.  Negative for environmental  allergies and polydipsia. Does not bruise/bleed easily.  Psychiatric/Behavioral: Negative for depression. The patient is not nervous/anxious and does not have insomnia.     Past Medical History:  Diagnosis Date  . Anxiety   . Autistic disorder   . Cancer ([place])    testicular  . Mentally challenged     mother states mentally "slow"  . Seasonal allergies     Past Surgical History:  Procedure Laterality Date  . ORCHIECTOMY Left 08/07/2013   Procedure: RADICAL ORCHIECTOMY;  Surgeon: Marissa Nestle, MD;  Location: [facility];  Service: Urology;  Laterality: Left;  . PORT-A-CATH REMOVAL Right July 2015  . PORTACATH PLACEMENT Right   . RADICAL ORCHIECTOMY Left 08/07/2013    Family History  Problem Relation Age of Onset  . Cancer Maternal Aunt   . Cancer Maternal Aunt     Social History   Social History  . Marital status: Single    Spouse name: N/A  . Number of children: N/A  . Years of education: N/A   Social History Main Topics  . Smoking status: Never Smoker  . Smokeless tobacco: Never Used  . Alcohol use No  . Drug use: No  . Sexual activity: No   Other Topics Concern  . None   Social History Narrative  . None     PHYSICAL EXAMINATION  ECOG PERFORMANCE STATUS: 0 - Asymptomatic  Vitals:   05/10/17 0924  BP: 113/76  Pulse: 89  Resp: 16  Temp: 98.3 F (36.8 C)   Physical Exam  Constitutional: He is oriented to person, place, and time. He appears well-developed and well-nourished. No distress.  HENT:  Head: Normocephalic and atraumatic.  Mouth/Throat: No oropharyngeal exudate.  Eyes: Conjunctivae are normal. Pupils are equal, round, and reactive to light. No scleral icterus.  Neck: Normal range of motion. Neck supple.  Cardiovascular: Normal rate, regular rhythm and normal heart sounds.   No murmur heard. Pulmonary/Chest: Effort normal and breath sounds normal. No respiratory distress. He has no wheezes. He has no rales. He exhibits no tenderness.  Abdominal: Soft. Bowel sounds are normal. He exhibits no distension. There is no tenderness.  Musculoskeletal: Normal range of motion. He exhibits no edema or tenderness.  Lymphadenopathy:    He has no cervical adenopathy.  Neurological: He is alert and oriented to person, place, and time. No cranial nerve  deficit.  Skin: Skin is warm and dry. He is not diaphoretic. No erythema.  Psychiatric: Judgment and thought content normal.     LABORATORY DATA: I have reviewed the data as listed. CBC    Component Value Date/Time   WBC 6.0 05/10/2017 0902   RBC 4.83 05/10/2017 0902   HGB 14.0 05/10/2017 0902   HCT 41.3 05/10/2017 0902   PLT 189 05/10/2017 0902   MCV 85.5 05/10/2017 0902   MCH 29.0 05/10/2017 0902   MCHC 33.9 05/10/2017 0902   RDW 12.9 05/10/2017 0902   LYMPHSABS 1.5 05/10/2017 0902   MONOABS 0.6 05/10/2017 0902   EOSABS 0.1 05/10/2017 0902   BASOSABS 0.0 05/10/2017 0902      Chemistry      Component Value Date/Time   NA 135 09/12/2016 1614   K 3.6 09/12/2016 1614   CL 104 09/12/2016 1614   CO2 24 09/12/2016 1614   BUN 14 09/12/2016 1614   CREATININE 1.01 09/12/2016 1614      Component Value Date/Time   CALCIUM 9.3 09/12/2016 1614   ALKPHOS 73 09/12/2016 1614   AST 26 09/12/2016  1614   ALT 23 09/12/2016 1614   BILITOT 0.7 09/12/2016 1614      PENDING LABS: None.   RADIOGRAPHIC STUDIES: I have personally reviewed the radiological images as listed and agreed with the findings in the report.  CT chest/abd/pelvis: 11/08/16     PATHOLOGY:    ASSESSMENT AND PLAN:  Mr. Dulworth is a pleasant 26 y.o. 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 He received 2 cycles of BEP and 2 cycles of EP due to DLCO abnormalities  Left testicular cancer:  Clinically NED. Tumor markers pending for today. We will follow up on it.  Check CBC, CMP, beta HCG, AFP, LDH on his next visit. Will get last CT C/A/P in 6 months, then switch to annual CXR. RTC in 6 months for follow up.   ORDERS PLACED FOR THIS ENCOUNTER: Orders Placed This Encounter  Procedures  . CT Abdomen Pelvis W Contrast  . CT Chest W Contrast  . CBC with Differential  . Comprehensive metabolic panel   . AFP tumor marker  . HCG, tumor marker  . Lactate dehydrogenase    MEDICATIONS PRESCRIBED THIS ENCOUNTER: No orders of the defined types were placed in this encounter.   THERAPY PLAN:  NCCN guidelines surveillance for Stage II-III nonseminomatous testicular cancer recommends the following surveillance (2.2017):  A. Year 1: H+P and markers every 2 months, abdominal/pelvic CT every 6 months, Chest X-ray every 6 months.  2. Year 2: H+P and markers every 3 months, abdominal/pelvic CT annually, chest x-ray every 6 months.  3. Year 3: H+P and markers every 6 months, and chest x-ray annually.   4. Year 4: H+P and markers every 6 months and chest x-ray annually.  5. Year 5: H+P and markers every 6 months.    All questions were answered. The patient knows to call the clinic with any problems, questions or concerns. We can certainly see the patient much sooner if necessary.  Twana First, MD

## 2017-05-10 NOTE — Patient Instructions (Signed)
[facility] at [facility] Discharge Instructions  RECOMMENDATIONS MADE BY THE CONSULTANT AND ANY TEST RESULTS WILL BE SENT TO YOUR REFERRING PHYSICIAN.  You saw Dr. Zhou today.  Thank you for choosing [facility] at [facility] to provide your oncology and hematology care.  To afford each patient quality time with our provider, please arrive at least 15 minutes before your scheduled appointment time.    If you have a lab appointment with the Cancer Center please come in thru the  Main Entrance and check in at the main information desk  You need to re-schedule your appointment should you arrive 10 or more minutes late.  We strive to give you quality time with our providers, and arriving late affects you and other patients whose appointments are after yours.  Also, if you no show three or more times for appointments you may be dismissed from the clinic at the providers discretion.     Again, thank you for choosing [facility].  Our hope is that these requests will decrease the amount of time that you wait before being seen by our physicians.       _____________________________________________________________  Should you have questions after your visit to [facility], please contact our office at [phone] between the hours of 8:30 a.m. and 4:30 p.m.  Voicemails left after 4:30 p.m. will not be returned until the following business day.  For prescription refill requests, have your pharmacy contact our office.       Resources For Cancer Patients and their Caregivers ? American Cancer Society: Can assist with transportation, wigs, general needs, runs Look Good Feel Better.        1-888-227-6333 ? Cancer Care: Provides financial assistance, online support groups, medication/co-pay assistance.  1-800-813-HOPE (4673) ? [facility] Assists [place] cancer patients and their families through  emotional , educational and financial support.  336-427-4357 ? [place] DSS Where to apply for food stamps, Medicaid and utility assistance. 336-342-1394 ? [facility]: Transportation to medical appointments. 336-347-2287 ? Social Security Administration: May apply for disability if have a Stage IV cancer. 336-342-7796 1-800-772-1213 ? [place] Aging, Disability and Transit Services: Assists with nutrition, care and transit needs. 336-349-2343  Cancer Center Support Programs: @10RELATIVEDAYS@ > Cancer Support Group  2nd Tuesday of the month 1pm-2pm, Journey Room  > Creative Journey  3rd Tuesday of the month 1130am-1pm, Journey Room  > Look Good Feel Better  1st Wednesday of the month 10am-12 noon, Journey Room (Call [employer] to register 1-800-395-5775)    

## 2017-05-11 LAB — BETA HCG QUANT (REF LAB): Beta hCG, Tumor Marker: <1 m[IU]/mL (ref 0–3)

## 2017-05-21 IMAGING — CT CT CHEST W/ CM
2 of 3 series · 15 of 36 positions shown, 18 images · IV contrast (Omnipaque 300)
Comparison: 03/11/2015

CLINICAL DATA: Followup metastatic non-seminomatous testicular
carcinoma. Status post chemotherapy and radiation therapy. Chest
pain for 2 weeks.

EXAM:
CT CHEST WITH CONTRAST
TECHNIQUE: Multidetector CT imaging of the chest was performed during
intravenous contrast administration.
CONTRAST:  75mL OMNIPAQUE IOHEXOL 300 MG/ML  SOLN

[Series 2: chestroutine 5.0 b40f · axial · 0.72mm/px · z∈[-291,-36]mm · 12 of 61 slices shown, 15 images]
[im 5/61  mediastinal]
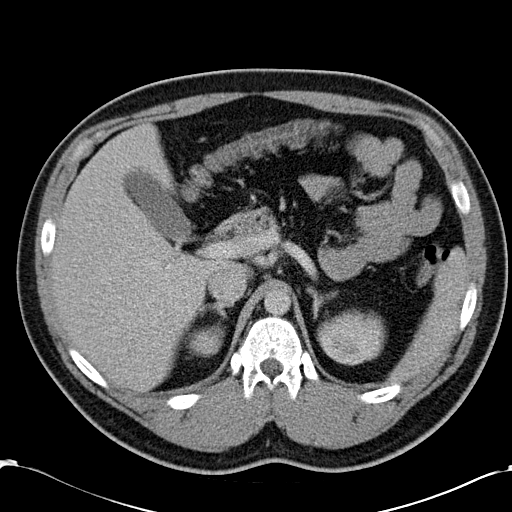
[im 5/61  lung]
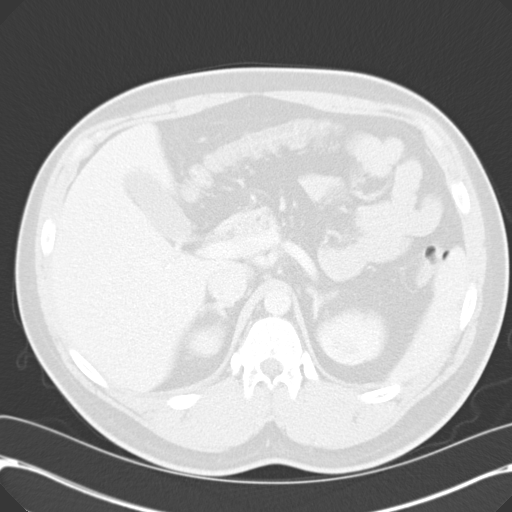
[im 9/61  lung]
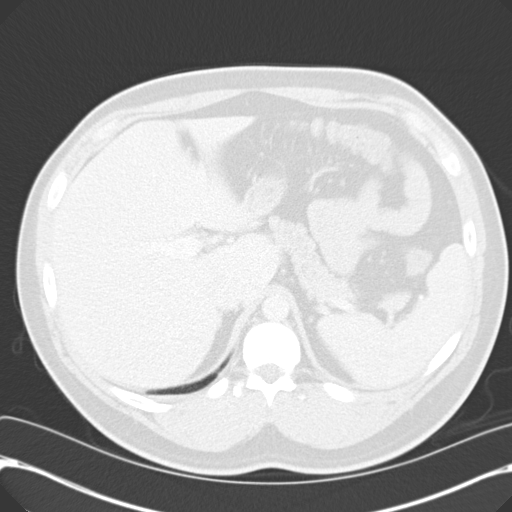
[im 14/61  lung]
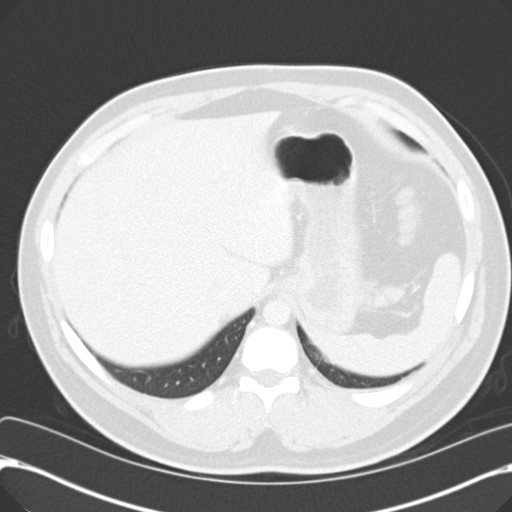
[im 18/61  lung]
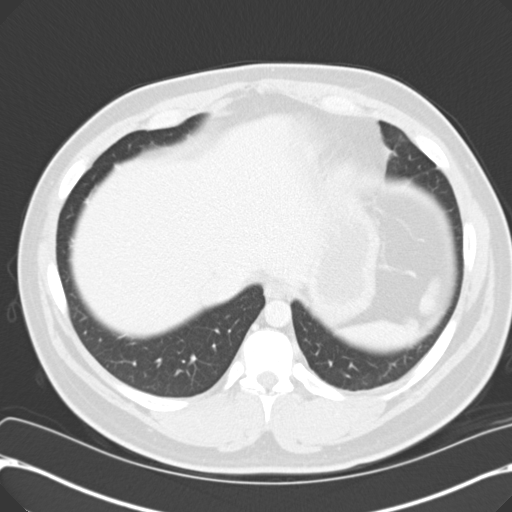
[im 23/61  mediastinal]
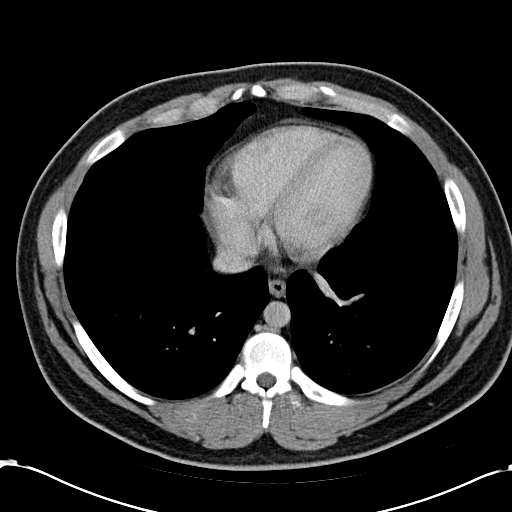
[im 23/61  lung]
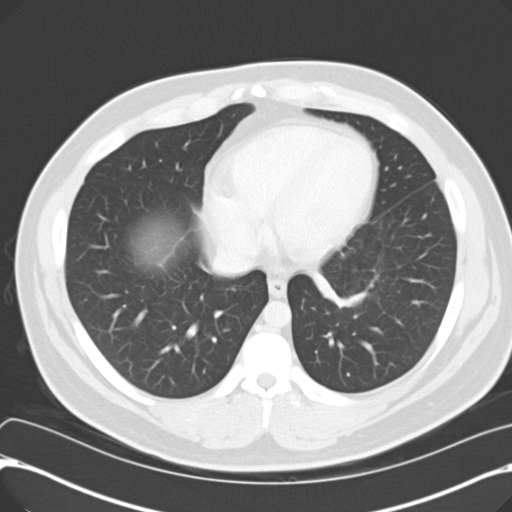
[im 27/61  lung]
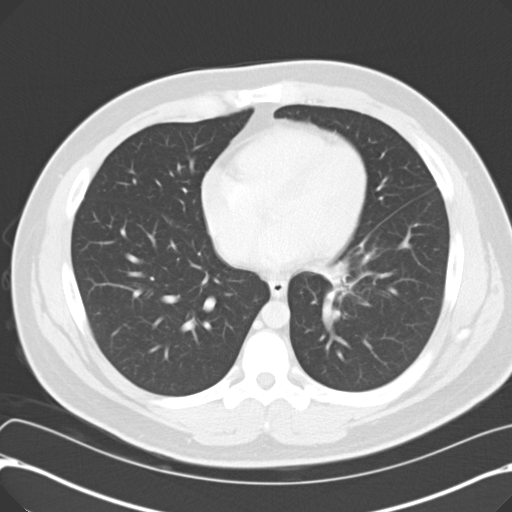
[im 34/61  lung]
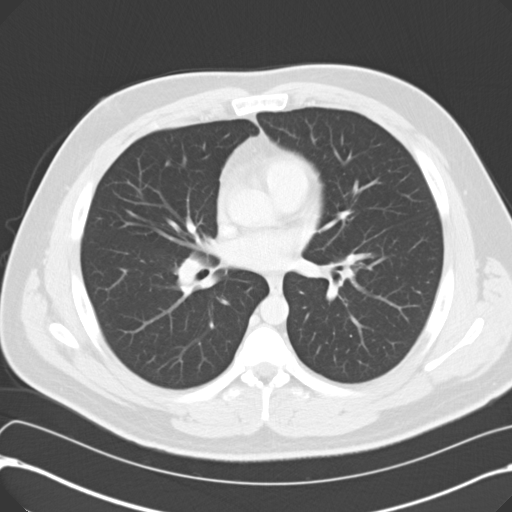
[im 38/61  lung]
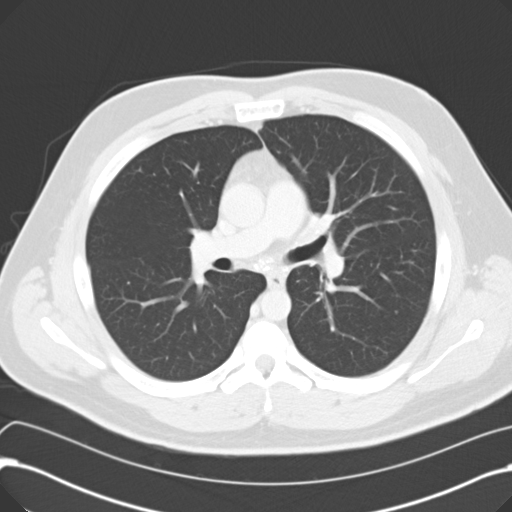
[im 43/61  mediastinal]
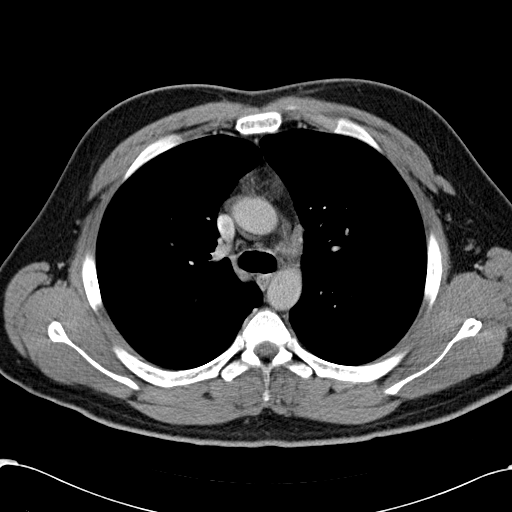
[im 43/61  lung]
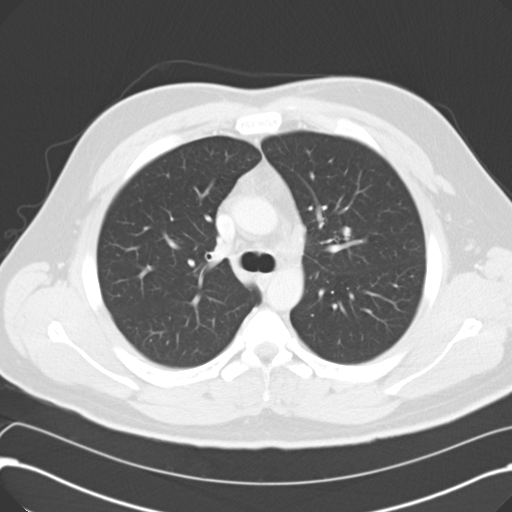
[im 47/61  lung]
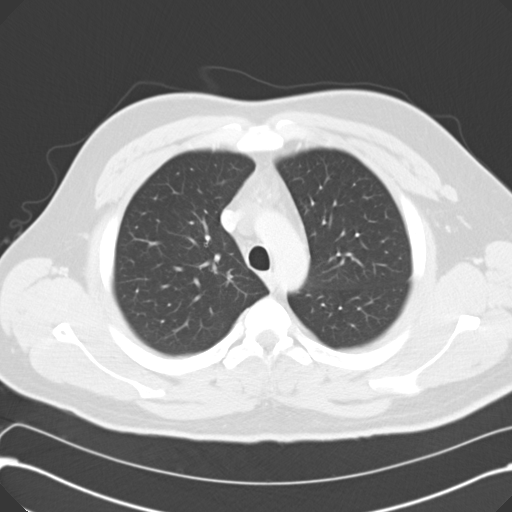
[im 52/61  lung]
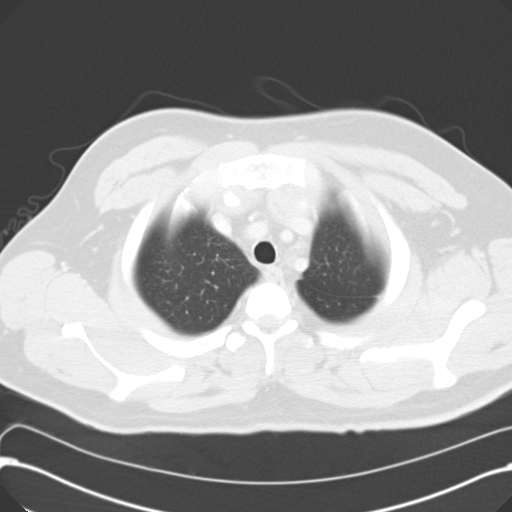
[im 56/61  lung]
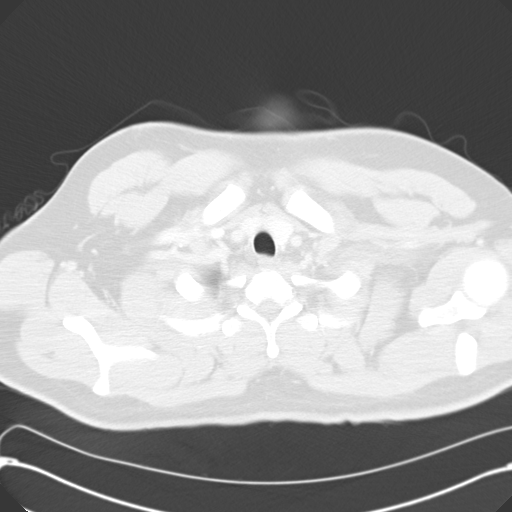

[Series 4: mpr coronal chest 3mm · coronal · 0.61mm/px · 3 of 95 slices shown]
[im 19/95  lung]
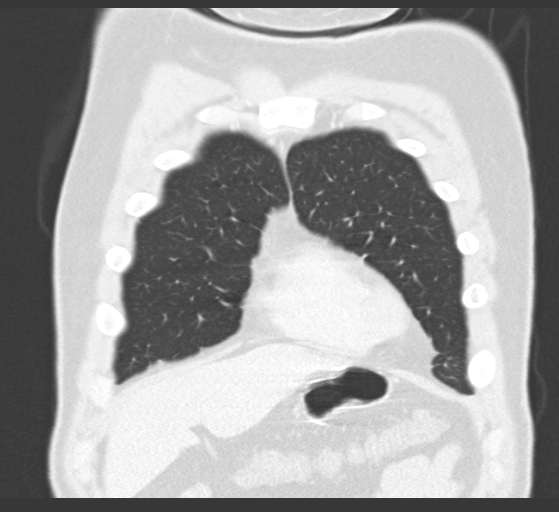
[im 38/95  lung]
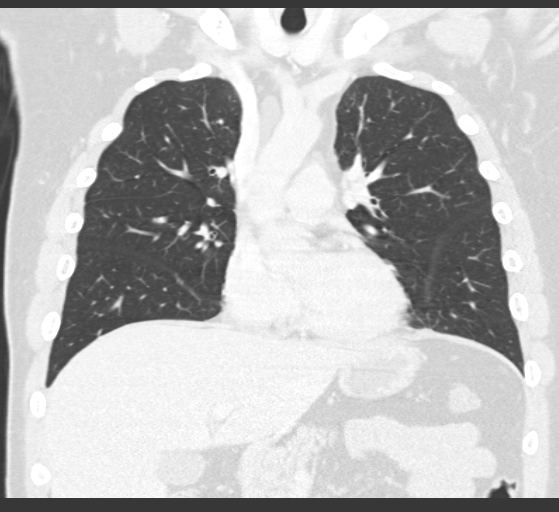
[im 57/95  lung]
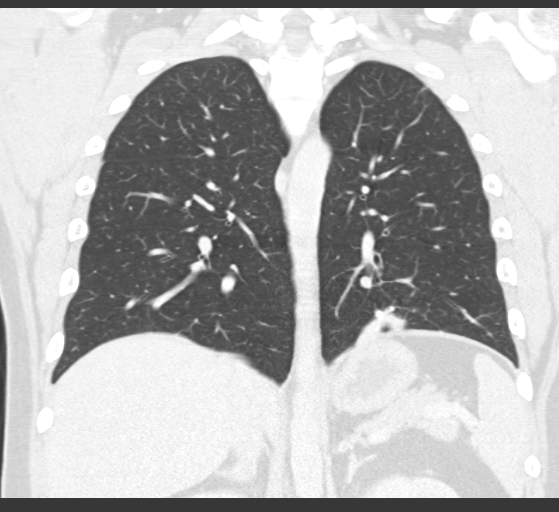

[15 of 36 positions shown; findings below may reference images not displayed]

FINDINGS: Mediastinum/Lymph Nodes: 11 mm subcarinal lymph node which is
partially calcified and 9 mm AP window lymph node remain stable.
Soft tissue stranding in the anterior mediastinum is also stable. No
hilar or axillary lymphadenopathy identified .

Lungs/Pleura: 1.5 x 1.6 cm poorly defined nodular opacity in the
infrahilar left lower lobe on image 34/series 3 remains stable no
other suspicious pulmonary nodules or masses identified. No evidence
of pleural effusion.

Musculoskeletal/Soft Tissues: No suspicious bone lesions or other
significant chest wall abnormality.

Upper Abdomen: Small cystic lesions in the anterior and superior
portion of the spleen remain stable.
IMPRESSION: Stable ill-defined left lower lobe nodular opacity measuring 1.5 x
1.6 cm.

Stable mildly enlarged partially calcified mediastinal lymph nodes.

No new or progressive disease identified within the thorax.

## 2017-11-01 ENCOUNTER — Encounter (HOSPITAL_COMMUNITY): Payer: Self-pay | Attending: Oncology

## 2017-11-01 DIAGNOSIS — F840 Autistic disorder: Secondary | ICD-10-CM | POA: Insufficient documentation

## 2017-11-01 DIAGNOSIS — C6290 Malignant neoplasm of unspecified testis, unspecified whether descended or undescended: Secondary | ICD-10-CM

## 2017-11-01 DIAGNOSIS — F419 Anxiety disorder, unspecified: Secondary | ICD-10-CM | POA: Insufficient documentation

## 2017-11-01 DIAGNOSIS — Z9221 Personal history of antineoplastic chemotherapy: Secondary | ICD-10-CM | POA: Insufficient documentation

## 2017-11-01 DIAGNOSIS — Z23 Encounter for immunization: Secondary | ICD-10-CM | POA: Insufficient documentation

## 2017-11-01 DIAGNOSIS — C7800 Secondary malignant neoplasm of unspecified lung: Secondary | ICD-10-CM | POA: Insufficient documentation

## 2017-11-01 DIAGNOSIS — Z8547 Personal history of malignant neoplasm of testis: Secondary | ICD-10-CM | POA: Insufficient documentation

## 2017-11-01 LAB — CBC WITH DIFFERENTIAL/PLATELET
Basophils Absolute: 0 10*3/uL (ref 0.0–0.1)
Basophils Relative: 0 %
EOS ABS: 0.3 10*3/uL (ref 0.0–0.7)
EOS PCT: 6 %
HCT: 41.9 % (ref 39.0–52.0)
Hemoglobin: 13.5 g/dL (ref 13.0–17.0)
LYMPHS ABS: 1.5 10*3/uL (ref 0.7–4.0)
LYMPHS PCT: 29 %
MCH: 28.7 pg (ref 26.0–34.0)
MCHC: 32.2 g/dL (ref 30.0–36.0)
MCV: 89 fL (ref 78.0–100.0)
MONO ABS: 0.6 10*3/uL (ref 0.1–1.0)
Monocytes Relative: 11 %
Neutro Abs: 2.8 10*3/uL (ref 1.7–7.7)
Neutrophils Relative %: 54 %
PLATELETS: 214 10*3/uL (ref 150–400)
RBC: 4.71 MIL/uL (ref 4.22–5.81)
RDW: 12.6 % (ref 11.5–15.5)
WBC: 5.2 10*3/uL (ref 4.0–10.5)

## 2017-11-01 LAB — COMPREHENSIVE METABOLIC PANEL
ALT: 28 U/L (ref 17–63)
ANION GAP: 6 (ref 5–15)
AST: 25 U/L (ref 15–41)
Albumin: 4.3 g/dL (ref 3.5–5.0)
Alkaline Phosphatase: 71 U/L (ref 38–126)
BUN: 19 mg/dL (ref 6–20)
CHLORIDE: 105 mmol/L (ref 101–111)
CO2: 26 mmol/L (ref 22–32)
Calcium: 9.4 mg/dL (ref 8.9–10.3)
Creatinine, Ser: 0.86 mg/dL (ref 0.61–1.24)
Glucose, Bld: 99 mg/dL (ref 65–99)
POTASSIUM: 3.9 mmol/L (ref 3.5–5.1)
SODIUM: 137 mmol/L (ref 135–145)
Total Bilirubin: 0.6 mg/dL (ref 0.3–1.2)
Total Protein: 7.4 g/dL (ref 6.5–8.1)

## 2017-11-01 LAB — LACTATE DEHYDROGENASE: LDH: 144 U/L (ref 98–192)

## 2017-11-02 LAB — AFP TUMOR MARKER: AFP, Serum, Tumor Marker: 5.9 ng/mL (ref 0.0–8.3)

## 2017-11-02 LAB — BETA HCG QUANT (REF LAB): hCG Quant: <1 m[IU]/mL (ref 0–3)

## 2017-11-05 ENCOUNTER — Ambulatory Visit (HOSPITAL_COMMUNITY)
Admission: RE | Admit: 2017-11-05 | Discharge: 2017-11-05 | Disposition: A | Discharge status: Home / Self Care | Payer: Medicaid Other | Source: Ambulatory Visit | Attending: Oncology | Admitting: Oncology

## 2017-11-05 DIAGNOSIS — C6290 Malignant neoplasm of unspecified testis, unspecified whether descended or undescended: Secondary | ICD-10-CM | POA: Insufficient documentation

## 2017-11-05 DIAGNOSIS — Z9079 Acquired absence of other genital organ(s): Secondary | ICD-10-CM | POA: Insufficient documentation

## 2017-11-05 MED ORDER — IOPAMIDOL (ISOVUE-300) INJECTION 61%
100.0000 mL | Freq: Once | INTRAVENOUS | Status: AC | PRN
  Administered 2017-11-05: 100 mL via INTRAVENOUS

## 2017-11-09 ENCOUNTER — Encounter (HOSPITAL_COMMUNITY): Payer: Self-pay | Admitting: Oncology

## 2017-11-09 ENCOUNTER — Encounter (HOSPITAL_BASED_OUTPATIENT_CLINIC_OR_DEPARTMENT_OTHER): Payer: Self-pay | Admitting: Oncology

## 2017-11-09 ENCOUNTER — Other Ambulatory Visit: Payer: Self-pay

## 2017-11-09 VITALS — BP 118/76 | HR 80 | Temp 98.3°F | Resp 18 | Wt 249.1 lb

## 2017-11-09 DIAGNOSIS — Z23 Encounter for immunization: Secondary | ICD-10-CM

## 2017-11-09 DIAGNOSIS — C6290 Malignant neoplasm of unspecified testis, unspecified whether descended or undescended: Secondary | ICD-10-CM

## 2017-11-09 DIAGNOSIS — Z9221 Personal history of antineoplastic chemotherapy: Secondary | ICD-10-CM

## 2017-11-09 DIAGNOSIS — Z8547 Personal history of malignant neoplasm of testis: Secondary | ICD-10-CM

## 2017-11-09 MED ORDER — INFLUENZA VAC SPLIT QUAD 0.5 ML IM SUSY
0.5000 mL | PREFILLED_SYRINGE | Freq: Once | INTRAMUSCULAR | Status: AC
  Administered 2017-11-09: 0.5 mL via INTRAMUSCULAR
  Filled 2017-11-09: qty 0.5

## 2017-11-09 NOTE — Patient Instructions (Signed)
[place] at [facility] Discharge Instructions  RECOMMENDATIONS MADE BY THE CONSULTANT AND ANY TEST RESULTS WILL BE SENT TO YOUR REFERRING PHYSICIAN.  You were seen today by Dr. Twana First YOU HAD YOUR FLU SHOT TODAY    Thank you for choosing [place] at [facility] to provide your oncology and hematology care.  To afford each patient quality time with our provider, please arrive at least 15 minutes before your scheduled appointment time.    If you have a lab appointment with the [facility] please come in thru the  Main Entrance and check in at the main information desk  You need to re-schedule your appointment should you arrive 10 or more minutes late.  We strive to give you quality time with our providers, and arriving late affects you and other patients whose appointments are after yours.  Also, if you no show three or more times for appointments you may be dismissed from the clinic at the providers discretion.     Again, thank you for choosing [facility].  Our hope is that these requests will decrease the amount of time that you wait before being seen by our physicians.       _____________________________________________________________  Should you have questions after your visit to [facility], please contact our office at [phone] between the hours of 8:30 a.m. and 4:30 p.m.  Voicemails left after 4:30 p.m. will not be returned until the following business day.  For prescription refill requests, have your pharmacy contact our office.       Resources For Cancer Patients and their Caregivers ? American Cancer Society: Can assist with transportation, wigs, general needs, runs Look Good Feel Better.        706-412-4232 ? Cancer Care: Provides financial assistance, online support groups, medication/co-pay assistance.  1-800-813-HOPE 832-870-2206) ? [place] Assists [place] cancer patients and their families through emotional , educational and financial support.  639-033-9375 ? [place] DSS Where to apply for food stamps, Medicaid and utility assistance. 607 421 9117 ? [facility]: Transportation to medical appointments. 669-327-0602 ? Social Security Administration: May apply for disability if have a Stage IV cancer. 281-149-0675 810-796-3970 ? [employer], Disability and Transit Services: Assists with nutrition, care and transit needs. [place] Support Programs: @10RELATIVEDAYS @ > Cancer Support Group  2nd Tuesday of the month 1pm-2pm, Journey Room  > Creative Journey  3rd Tuesday of the month 1130am-1pm, Journey Room  > Look Good Feel Better  1st Wednesday of the month 10am-12 noon, Journey Room (Call [place] to register [phone])

## 2017-11-09 NOTE — Progress Notes (Signed)
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 He received 2 cycles of BEP and 2 cycles of EP due to DLCO abnormalities  Oncology History   Patient experienced trauma 1-2 years prior to diagnosis. A Bard enlarging mass until mother discovered it. Underwent left radical orchiectomy on 08/07/2013. No scans or repeat lab tests have been performed until the time [facility].     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  Pre op a-FP =1620 on 07/22/2013  B-HCG=3225      11/24/2013 - 12/29/2013 Chemotherapy    BEP 2, discontinued because of decrease in DLCO      01/05/2014 - 01/26/2014 Chemotherapy    EP 4 with normalization of beta-hCG and alpha-fetoprotein      06/15/2014 Imaging    CT C/A/P Pulmonary lesions within the left lower lobe have decreased in size from previous exam consistent with response to therapy. 2. No new or progressive disease identified.      03/11/2015 Imaging    CT CAP- Status post left orchiectomy. New complete resolution of prior left lower lobe mass. Mild residual scarring/opacity measuring 1.5 x 1.6 cm. Mildly prominent mediastinal lymph nodes, indeterminate.No evidence of new/progressive metastatic disease      06/25/2015 Imaging    CT chest- Stable ill-defined left lower lobe nodular opacity measuring 1.5 x 1.6 cm. Stable mildly enlarged partially calcified mediastinal lymph nodes. No new or progressive disease identified within the  thorax.      11/10/2015 Imaging    CT CAP- No evidence of metastatic disease. Scarring in the left lower lobe at the site of previous metastasis      11/08/2016 Imaging    CT CAP- Stable exam. No evidence of metastatic disease or other acute findings within the chest, abdomen, or pelvis.      Devon Bailey returns to the [facility] today accompanied by his mother.  Patient presented today for review his restaging scans with his mother.  He states that overall he has been doing well he denies any chest pain, shortness breath, abdominal pain, nausea, vomiting, diarrhea, focal weakness.  He has not had any recent infections.  Review of Systems  Constitutional: Negative.  Negative for chills, fever, malaise/fatigue and weight loss.  HENT: Negative.  Negative for hearing loss, sore throat and tinnitus.   Eyes: Negative.  Negative for blurred vision, double vision, photophobia and discharge.  Respiratory: Negative.  Negative for cough, hemoptysis, sputum production, shortness of breath and wheezing.   Cardiovascular: Negative for chest pain, palpitations, orthopnea, claudication and leg swelling.  Gastrointestinal: Negative for abdominal pain, constipation, diarrhea, melena, nausea and vomiting.  Genitourinary: Negative.  Negative for dysuria and hematuria.  Musculoskeletal: Negative.  Negative for back pain, joint pain and myalgias.  Skin: Negative.  Negative for itching and rash.  Neurological: Negative.  Negative for dizziness, weakness and headaches.  Endo/Heme/Allergies: Negative.  Negative for environmental allergies and polydipsia. Does not bruise/bleed easily.  Psychiatric/Behavioral: Negative for depression. The patient  is not nervous/anxious and does not have insomnia.     Past Medical History:  Diagnosis Date  . Anxiety   . Autistic disorder   . Cancer ([place])    testicular  . Mentally challenged    mother states mentally "slow"  . Seasonal allergies     Past Surgical  History:  Procedure Laterality Date  . ORCHIECTOMY Left 08/07/2013   Procedure: RADICAL ORCHIECTOMY;  Surgeon: Marissa Nestle, MD;  Location: [facility];  Service: Urology;  Laterality: Left;  . PORT-A-CATH REMOVAL Right July 2015  . PORTACATH PLACEMENT Right   . RADICAL ORCHIECTOMY Left 08/07/2013    Family History  Problem Relation Age of Onset  . Cancer Maternal Aunt   . Cancer Maternal Aunt     Social History   Socioeconomic History  . Marital status: Single    Spouse name: None  . Number of children: None  . Years of education: None  . Highest education level: None  Social Needs  . Financial resource strain: None  . Food insecurity - worry: None  . Food insecurity - inability: None  . Transportation needs - medical: None  . Transportation needs - non-medical: None  Occupational History  . None  Tobacco Use  . Smoking status: Never Smoker  . Smokeless tobacco: Never Used  Substance and Sexual Activity  . Alcohol use: No  . Drug use: No  . Sexual activity: No  Other Topics Concern  . None  Social History Narrative  . None     PHYSICAL EXAMINATION  ECOG PERFORMANCE STATUS: 0 - Asymptomatic  Vitals:   11/09/17 1149  BP: 118/76  Pulse: 80  Resp: 18  Temp: 98.3 F (36.8 C)  SpO2: 97%   Physical Exam  Constitutional: He is oriented to person, place, and time. He appears well-developed and well-nourished. No distress.  HENT:  Head: Normocephalic and atraumatic.  Mouth/Throat: No oropharyngeal exudate.  Eyes: Conjunctivae are normal. Pupils are equal, round, and reactive to light. No scleral icterus.  Neck: Normal range of motion. Neck supple.  Cardiovascular: Normal rate, regular rhythm and normal heart sounds.  No murmur heard. Pulmonary/Chest: Effort normal and breath sounds normal. No respiratory distress. He has no wheezes. He has no rales. He exhibits no tenderness.  Abdominal: Soft. Bowel sounds are normal. He exhibits no distension. There is no  tenderness.  Musculoskeletal: Normal range of motion. He exhibits no edema or tenderness.  Lymphadenopathy:    He has no cervical adenopathy.  Neurological: He is alert and oriented to person, place, and time. No cranial nerve deficit.  Skin: Skin is warm and dry. He is not diaphoretic. No erythema.  Psychiatric: Judgment and thought content normal.     LABORATORY DATA: I have reviewed the data as listed. CBC    Component Value Date/Time   WBC 5.2 11/01/2017 1129   RBC 4.71 11/01/2017 1129   HGB 13.5 11/01/2017 1129   HCT 41.9 11/01/2017 1129   PLT 214 11/01/2017 1129   MCV 89.0 11/01/2017 1129   MCH 28.7 11/01/2017 1129   MCHC 32.2 11/01/2017 1129   RDW 12.6 11/01/2017 1129   LYMPHSABS 1.5 11/01/2017 1129   MONOABS 0.6 11/01/2017 1129   EOSABS 0.3 11/01/2017 1129   BASOSABS 0.0 11/01/2017 1129      Chemistry      Component Value Date/Time   NA 137 11/01/2017 1129   K 3.9 11/01/2017 1129   CL 105 11/01/2017 1129   CO2 26 11/01/2017 1129  BUN 19 11/01/2017 1129   CREATININE 0.86 11/01/2017 1129      Component Value Date/Time   CALCIUM 9.4 11/01/2017 1129   ALKPHOS 71 11/01/2017 1129   AST 25 11/01/2017 1129   ALT 28 11/01/2017 1129   BILITOT 0.6 11/01/2017 1129      PENDING LABS: None.   RADIOGRAPHIC STUDIES: I have personally reviewed the radiological images as listed and agreed with the findings in the report.  CT chest/abd/pelvis: 11/08/16     PATHOLOGY:    ASSESSMENT AND PLAN:  Mr. Haack is a pleasant 26 y.o. 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 He received 2 cycles of BEP and 2 cycles of EP due to DLCO abnormalities  Left testicular cancer:  Clinically NED. Tumor markers are stable. Check CBC, CMP, beta HCG, AFP, LDH on his next visit. Reviewed CT C/A/P with him, he has no evidence of disease. RTC in 6 months for follow up with  labs and CXR. Flu shot given today at patient's request.  ORDERS PLACED FOR THIS ENCOUNTER: Orders Placed This Encounter  Procedures  . DG Chest 2 View  . CBC with Differential  . Comprehensive metabolic panel  . Lactate dehydrogenase  . AFP tumor marker  . HCG, tumor marker    MEDICATIONS PRESCRIBED THIS ENCOUNTER: Meds ordered this encounter  Medications  . Influenza vac split quadrivalent PF (FLUARIX) injection 0.5 mL    THERAPY PLAN:  NCCN guidelines surveillance for Stage II-III nonseminomatous testicular cancer recommends the following surveillance (2.2017):  A. Year 1: H+P and markers every 2 months, abdominal/pelvic CT every 6 months, Chest X-ray every 6 months.  2. Year 2: H+P and markers every 3 months, abdominal/pelvic CT annually, chest x-ray every 6 months.  3. Year 3: H+P and markers every 6 months, and chest x-ray annually.   4. Year 4: H+P and markers every 6 months and chest x-ray annually.  5. Year 5: H+P and markers every 6 months.    All questions were answered. The patient knows to call the clinic with any problems, questions or concerns. We can certainly see the patient much sooner if necessary.  Twana First, MD

## 2017-11-09 NOTE — Progress Notes (Signed)
Devon Bailey presents today for injection per MD orders. Fluarix administered IM in left deltoid. Administration without incident. Patient tolerated well. Patient tolerated treatment without incidence. Patient discharged ambulatory and in stable condition from clinic. Patient to follow up as scheduled.

## 2018-05-03 ENCOUNTER — Ambulatory Visit (HOSPITAL_COMMUNITY)
Admission: RE | Admit: 2018-05-03 | Discharge: 2018-05-03 | Disposition: A | Discharge status: Home / Self Care | Payer: Self-pay | Source: Ambulatory Visit | Attending: Oncology | Admitting: Oncology

## 2018-05-03 ENCOUNTER — Inpatient Hospital Stay (HOSPITAL_COMMUNITY): Payer: Self-pay | Attending: Hematology

## 2018-05-03 DIAGNOSIS — C6290 Malignant neoplasm of unspecified testis, unspecified whether descended or undescended: Secondary | ICD-10-CM | POA: Insufficient documentation

## 2018-05-03 LAB — LACTATE DEHYDROGENASE: LDH: 151 U/L (ref 98–192)

## 2018-05-04 LAB — BETA HCG QUANT (REF LAB)

## 2018-05-04 LAB — AFP TUMOR MARKER: AFP, SERUM, TUMOR MARKER: 5.3 ng/mL (ref 0.0–8.3)

## 2018-05-10 ENCOUNTER — Ambulatory Visit (HOSPITAL_COMMUNITY): Payer: Medicaid Other | Admitting: Hematology

## 2018-05-14 ENCOUNTER — Encounter (HOSPITAL_COMMUNITY): Payer: Self-pay | Admitting: Internal Medicine

## 2018-05-14 ENCOUNTER — Inpatient Hospital Stay (HOSPITAL_COMMUNITY): Payer: Self-pay | Attending: Hematology | Admitting: Internal Medicine

## 2018-05-14 VITALS — BP 123/77 | HR 91 | Temp 98.2°F | Resp 16 | Wt 259.2 lb

## 2018-05-14 DIAGNOSIS — C6292 Malignant neoplasm of left testis, unspecified whether descended or undescended: Secondary | ICD-10-CM | POA: Insufficient documentation

## 2018-05-14 DIAGNOSIS — C7802 Secondary malignant neoplasm of left lung: Secondary | ICD-10-CM | POA: Insufficient documentation

## 2018-05-14 DIAGNOSIS — R0789 Other chest pain: Secondary | ICD-10-CM | POA: Insufficient documentation

## 2018-05-14 DIAGNOSIS — Z8249 Family history of ischemic heart disease and other diseases of the circulatory system: Secondary | ICD-10-CM | POA: Insufficient documentation

## 2018-05-14 DIAGNOSIS — C6290 Malignant neoplasm of unspecified testis, unspecified whether descended or undescended: Secondary | ICD-10-CM

## 2018-05-14 NOTE — Patient Instructions (Signed)
[facility] at [facility] Discharge Instructions  You saw Dr. Higgs today.   Thank you for choosing [facility] at [facility] to provide your oncology and hematology care.  To afford each patient quality time with our provider, please arrive at least 15 minutes before your scheduled appointment time.   If you have a lab appointment with the Cancer Center please come in thru the  Main Entrance and check in at the main information desk  You need to re-schedule your appointment should you arrive 10 or more minutes late.  We strive to give you quality time with our providers, and arriving late affects you and other patients whose appointments are after yours.  Also, if you no show three or more times for appointments you may be dismissed from the clinic at the providers discretion.     Again, thank you for choosing [facility].  Our hope is that these requests will decrease the amount of time that you wait before being seen by our physicians.       _____________________________________________________________  Should you have questions after your visit to [facility], please contact our office at [phone] between the hours of 8:30 a.m. and 4:30 p.m.  Voicemails left after 4:30 p.m. will not be returned until the following business day.  For prescription refill requests, have your pharmacy contact our office.       Resources For Cancer Patients and their Caregivers ? American Cancer Society: Can assist with transportation, wigs, general needs, runs Look Good Feel Better.        1-888-227-6333 ? Cancer Care: Provides financial assistance, online support groups, medication/co-pay assistance.  1-800-813-HOPE (4673) ? [facility] Assists [place] cancer patients and their families through emotional , educational and financial support.  336-427-4357 ? [place] DSS Where to apply for food  stamps, Medicaid and utility assistance. 336-342-1394 ? [facility]: Transportation to medical appointments. 336-347-2287 ? Social Security Administration: May apply for disability if have a Stage IV cancer. 336-342-7796 1-800-772-1213 ? [place] Aging, Disability and Transit Services: Assists with nutrition, care and transit needs. 336-349-2343  Cancer Center Support Programs:   > Cancer Support Group  2nd Tuesday of the month 1pm-2pm, Journey Room   > Creative Journey  3rd Tuesday of the month 1130am-1pm, Journey Room     

## 2018-05-17 NOTE — Progress Notes (Signed)
Diagnosis Malignant neoplasm of testis, unspecified laterality, unspecified whether descended or undescended ([place]) - Plan: Lactate dehydrogenase, HCG, tumor marker, AFP tumor marker, CBC with Differential/Platelet, Comprehensive metabolic panel  Staging Cancer Staging Cancer of testis, non-seminomatous ([place]) Staging form: Testis, AJCC 7th Edition - Clinical stage from 08/07/2013: Stage IIIC (T2, N2, M1b, S1) - Signed by Baird Cancer, PA-C on 08/04/2015 Prognostic indicators: No HCG or AFP pre-op.  Post-op elevations noted. - Pathologic: Stage IIIC (T2, NX, M1b, SX) - Signed by Baird Cancer, PA-C on 08/04/2015 Prognostic indicators: Pre-op HCG and AFP not performed.  Elevation noted post-surgery.  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 He received 2 cycles of BEP and 2 cycles of EP due to DLCO abnormalities.  Pt had CT CAP done 10/2017 that showed not evidence of metastatic disease.  Labs done 05/21/18 showed Normal LDH 151, BHCG < 1, AFP 5.3.     CXR done May 21, 2018 reviewed with pt and was negative.    He will RTC in 6 months for repeat labs and follow-up  2.  Chest wall discomfort.  Pt reports family history of cardiac disease in a grandfather who died in his 33s.  CXR done 05/21/2018 was negative.  Mother feels symptoms are related to port scar tissue.  He has option of cardiology or surgical referral.  They will notify the office if pt desires referral.    3.  Family history of CV disease.  Mother reports grandfather died in his 24s from CAD.  Pt was given option of cardiology referral.  Will discuss further with PCP due to chest wall complaints.    4.  Health maintenance.  Pt reports walking for physical activity.    INTERVAL HISTORY:  Historical data obtained from note dated 11/09/2017:   27 y.o. male followed by Dr. 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 He received 2 cycles of BEP and 2 cycles of EP due to DLCO abnormalities.    Current Status:  Pt is seen today for follow-up.  He reports some minor chest wall discomfort.  Mother feels this is due to scar tissue near port.    Oncology History   Patient experienced trauma 1-2 years prior to diagnosis. A Bard enlarging mass until mother discovered it. Underwent left radical orchiectomy on 08/07/2013. No scans or repeat lab tests have been performed until the time he presented at the [facility] for evaluation.     Cancer of testis, non-seminomatous ([place])   08/07/2013 Initial Diagnosis    Cancer of testis, non-seminomatous      08/07/2013 Surgery    Left radical orchictomy pT2NxMx--mixed with 70% yolk sac and , 30% embryonal carcinoma.  Pre op a-FP =1620 on 07/22/2013  B-HCG=3225      11/24/2013 - 12/29/2013 Chemotherapy    BEP 2, discontinued because of decrease in DLCO      01/05/2014 - 01/26/2014 Chemotherapy    EP 4 with normalization of beta-hCG and alpha-fetoprotein      06/15/2014 Imaging    CT C/A/P Pulmonary lesions within the left lower lobe have decreased in size from previous exam consistent with response to therapy. 2. No new or progressive disease identified.      03/11/2015 Imaging    CT CAP- Status post left orchiectomy. New complete resolution of prior  left lower lobe mass. Mild residual scarring/opacity measuring 1.5 x 1.6 cm. Mildly prominent mediastinal lymph nodes, indeterminate.No evidence of new/progressive metastatic disease      06/25/2015 Imaging    CT chest- Stable ill-defined left lower lobe nodular opacity measuring 1.5 x 1.6 cm. Stable mildly enlarged partially calcified mediastinal lymph nodes. No new or progressive disease identified within the thorax.      11/10/2015 Imaging    CT CAP- No evidence of  metastatic disease. Scarring in the left lower lobe at the site of previous metastasis      11/08/2016 Imaging    CT CAP- Stable exam. No evidence of metastatic disease or other acute findings within the chest, abdomen, or pelvis.      11/05/2017 Imaging    CT CAP: Unremarkable and stable CT examination of the chest, abdomen and pelvis. No findings for recurrent metastatic disease.        Problem List Patient Active Problem List   Diagnosis Date Noted  . Abnormal pulmonary function test, 78% of predicted DLCO [R94.2] 02/16/2014  . Allergic rhinitis [J30.9] 01/20/2014  . 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 Mentally challenged [F79] 10/23/2013  . Cancer of testis, non-seminomatous ([place]) [C62.90] 10/22/2013    Past Medical History Past Medical History:  Diagnosis Date  . Anxiety   . Autistic disorder   . Cancer ([place])    testicular  . Mentally challenged    mother states mentally "slow"  . Seasonal allergies     Past Surgical History Past Surgical History:  Procedure Laterality Date  . ORCHIECTOMY Left 08/07/2013   Procedure: RADICAL ORCHIECTOMY;  Surgeon: Marissa Nestle, MD;  Location: [facility];  Service: Urology;  Laterality: Left;  . PORT-A-CATH REMOVAL Right July 2015  . PORTACATH PLACEMENT Right   . RADICAL ORCHIECTOMY Left 08/07/2013    Family History Family History  Problem Relation Age of Onset  . Cancer Maternal Aunt   . Cancer Maternal Aunt      Social History  reports that he has never smoked. He has never used smokeless tobacco. He reports that he does not drink alcohol or use drugs.  Medications  Current Outpatient Medications:  .  ibuprofen (ADVIL,MOTRIN) 200 MG tablet, Take 200 mg by mouth every 6 (six) hours as needed. Reported on 11/04/2015, Disp: , Rfl:  No current facility-administered medications for this visit.   Facility-Administered Medications Ordered in Other Visits:  .  LORazepam (ATIVAN) injection 0.5 mg, 0.5  mg, Intravenous, Once, Farrel Gobble, MD  Allergies Penicillins  Review of Systems Review of Systems - Oncology ROS negative other than chest wall discomfort.     Physical Exam  Vitals Wt Readings from Last 3 Encounters:  05/14/18 259 lb 3.2 oz (117.6 kg)  11/09/17 249 lb 1.6 oz (113 kg)  05/10/17 245 lb 9.6 oz (111.4 kg)   Temp Readings from Last 3 Encounters:  05/14/18 98.2 F (36.8 C) (Oral)  11/09/17 98.3 F (36.8 C) (Oral)  05/10/17 98.3 F (36.8 C) (Oral)   BP Readings from Last 3 Encounters:  05/14/18 123/77  11/09/17 118/76  05/10/17 113/76   Pulse Readings from Last 3 Encounters:  05/14/18 91  11/09/17 80  05/10/17 89   Constitutional: Well-developed, well-nourished, and in no distress.   HENT: Head: Normocephalic and atraumatic.  Mouth/Throat: No oropharyngeal exudate. Mucosa moist. Eyes: Pupils are equal, round, and reactive to light. Conjunctivae are normal. No scleral icterus.  Neck: Normal range of motion. Neck supple. No JVD  present.  Cardiovascular: Normal rate, regular rhythm and normal heart sounds.  Exam reveals no gallop and no friction rub.   No murmur heard.  Pt reports some chest wall discomfort which mother thinks is due to port scar.   Pulmonary/Chest: Effort normal and breath sounds normal. No respiratory distress. No wheezes.No rales.  Abdominal: Soft. Bowel sounds are normal. No distension. There is no tenderness. There is no guarding.  Musculoskeletal: No edema or tenderness.  Lymphadenopathy: No cervical, axillary or supraclavicular adenopathy.  Neurological: Alert and oriented to person, place, and time. No cranial nerve deficit.  Skin: Skin is warm and dry. No rash noted. No erythema. No pallor.  Psychiatric: Affect and judgment normal.   Labs No visits with results within 3 Day(s) from this visit.  Latest known visit with results is:  Appointment on 05/03/2018  Component Date Value Ref Range Status  . LDH 05/03/2018 151  98  - 192 U/L Final   Performed at [facility], [address], [place], [place] [address]  . AFP, Serum, Tumor Marker 05/03/2018 5.3  0.0 - 8.3 ng/mL Final   Comment: (NOTE) Roche [employer] Electrochemiluminescence Immunoassay (ECLIA) Values obtained with different assay methods or kits cannot be used interchangeably.  Results cannot be interpreted as absolute evidence of the presence or absence of malignant disease. This test is not interpretable in pregnant females. Performed At: [facility] [place], [place] [id number] Rush Farmer MD [id number] Performed at [facility], [address] [place], [place] [address]   . hCG Quant 05/03/2018 <1  0 - 3 mIU/mL Final   Comment: (NOTE) Roche ECLIA methodology Performed At: [facility] [place], [place] [id number] Rush Farmer MD [id number] Performed at [facility], [address] [place], [place] [address]      Pathology Orders Placed This Encounter  Procedures  . Lactate dehydrogenase    Standing Status:   Future    Standing Expiration Date:   05/15/2019  . HCG, tumor marker    Standing Status:   Future    Standing Expiration Date:   05/15/2019  . AFP tumor marker    Standing Status:   Future    Standing Expiration Date:   05/15/2019  . CBC with Differential/Platelet    Standing Status:   Future    Standing Expiration Date:   05/15/2019  . Comprehensive metabolic panel    Standing Status:   Future    Standing Expiration Date:   05/15/2019       Zoila Shutter MD

## 2018-10-29 ENCOUNTER — Other Ambulatory Visit (HOSPITAL_COMMUNITY): Payer: Self-pay

## 2018-10-30 ENCOUNTER — Other Ambulatory Visit (HOSPITAL_COMMUNITY): Payer: Self-pay

## 2018-11-05 ENCOUNTER — Ambulatory Visit (HOSPITAL_COMMUNITY): Payer: Self-pay | Admitting: Internal Medicine

## 2018-11-18 ENCOUNTER — Inpatient Hospital Stay (HOSPITAL_COMMUNITY): Payer: Self-pay | Attending: Internal Medicine

## 2018-11-18 ENCOUNTER — Other Ambulatory Visit (HOSPITAL_COMMUNITY): Payer: Self-pay

## 2018-11-18 DIAGNOSIS — C6290 Malignant neoplasm of unspecified testis, unspecified whether descended or undescended: Secondary | ICD-10-CM | POA: Insufficient documentation

## 2018-11-18 LAB — COMPREHENSIVE METABOLIC PANEL
ALK PHOS: 68 U/L (ref 38–126)
ALT: 34 U/L (ref 0–44)
AST: 28 U/L (ref 15–41)
Albumin: 4.4 g/dL (ref 3.5–5.0)
Anion gap: 6 (ref 5–15)
BILIRUBIN TOTAL: 0.5 mg/dL (ref 0.3–1.2)
BUN: 17 mg/dL (ref 6–20)
CALCIUM: 9.5 mg/dL (ref 8.9–10.3)
CO2: 25 mmol/L (ref 22–32)
CREATININE: 0.91 mg/dL (ref 0.61–1.24)
Chloride: 108 mmol/L (ref 98–111)
Glucose, Bld: 102 mg/dL — ABNORMAL HIGH (ref 70–99)
Potassium: 4.1 mmol/L (ref 3.5–5.1)
Sodium: 139 mmol/L (ref 135–145)
TOTAL PROTEIN: 7.6 g/dL (ref 6.5–8.1)

## 2018-11-18 LAB — CBC WITH DIFFERENTIAL/PLATELET
ABS IMMATURE GRANULOCYTES: 0.02 10*3/uL (ref 0.00–0.07)
BASOS PCT: 0 %
Basophils Absolute: 0 10*3/uL (ref 0.0–0.1)
Eosinophils Absolute: 0.2 10*3/uL (ref 0.0–0.5)
Eosinophils Relative: 3 %
HEMATOCRIT: 43.7 % (ref 39.0–52.0)
Hemoglobin: 14.1 g/dL (ref 13.0–17.0)
IMMATURE GRANULOCYTES: 0 %
LYMPHS ABS: 1.6 10*3/uL (ref 0.7–4.0)
Lymphocytes Relative: 23 %
MCH: 28.4 pg (ref 26.0–34.0)
MCHC: 32.3 g/dL (ref 30.0–36.0)
MCV: 87.9 fL (ref 80.0–100.0)
MONO ABS: 0.6 10*3/uL (ref 0.1–1.0)
MONOS PCT: 9 %
NEUTROS ABS: 4.3 10*3/uL (ref 1.7–7.7)
NEUTROS PCT: 65 %
PLATELETS: 253 10*3/uL (ref 150–400)
RBC: 4.97 MIL/uL (ref 4.22–5.81)
RDW: 12.8 % (ref 11.5–15.5)
WBC: 6.7 10*3/uL (ref 4.0–10.5)
nRBC: 0 % (ref 0.0–0.2)

## 2018-11-18 LAB — LACTATE DEHYDROGENASE: LDH: 146 U/L (ref 98–192)

## 2018-11-19 LAB — BETA HCG QUANT (REF LAB)

## 2018-11-19 LAB — AFP TUMOR MARKER: AFP, SERUM, TUMOR MARKER: 5.4 ng/mL (ref 0.0–8.3)

## 2018-11-26 ENCOUNTER — Encounter (HOSPITAL_COMMUNITY): Payer: Self-pay | Admitting: Internal Medicine

## 2018-11-26 ENCOUNTER — Inpatient Hospital Stay (HOSPITAL_COMMUNITY): Payer: Self-pay | Attending: Internal Medicine | Admitting: Internal Medicine

## 2018-11-26 ENCOUNTER — Other Ambulatory Visit: Payer: Self-pay

## 2018-11-26 VITALS — BP 138/82 | HR 88 | Temp 98.9°F | Resp 18 | Wt 268.6 lb

## 2018-11-26 DIAGNOSIS — Z9221 Personal history of antineoplastic chemotherapy: Secondary | ICD-10-CM | POA: Insufficient documentation

## 2018-11-26 DIAGNOSIS — C6290 Malignant neoplasm of unspecified testis, unspecified whether descended or undescended: Secondary | ICD-10-CM

## 2018-11-26 DIAGNOSIS — Z79899 Other long term (current) drug therapy: Secondary | ICD-10-CM | POA: Insufficient documentation

## 2018-11-26 DIAGNOSIS — Z8547 Personal history of malignant neoplasm of testis: Secondary | ICD-10-CM | POA: Insufficient documentation

## 2018-11-26 DIAGNOSIS — F840 Autistic disorder: Secondary | ICD-10-CM | POA: Insufficient documentation

## 2018-11-26 DIAGNOSIS — Z9079 Acquired absence of other genital organ(s): Secondary | ICD-10-CM | POA: Insufficient documentation

## 2018-11-26 NOTE — Progress Notes (Signed)
Diagnosis Malignant neoplasm of testis, unspecified laterality, unspecified whether descended or undescended [facility]) - Plan: CBC with Differential/Platelet, Comprehensive metabolic panel, Lactate dehydrogenase, HCG, tumor marker, AFP tumor marker  Staging Cancer Staging Cancer of testis, non-seminomatous ([place]) Staging form: Testis, AJCC 7th Edition - Clinical stage from 08/07/2013: Stage IIIC (T2, N2, M1b, S1) - Signed by Baird Cancer, PA-C on 08/04/2015 Prognostic indicators: No HCG or AFP pre-op.  Post-op elevations noted. - Pathologic: Stage IIIC (T2, NX, M1b, SX) - Signed by Baird Cancer, PA-C on 08/04/2015 Prognostic indicators: Pre-op HCG and AFP not performed.  Elevation noted post-surgery.   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 He received 2 cycles of BEP and 2 cycles of EP due to DLCO abnormalities.  Pt had CT CAP done 10/2017 that showed not evidence of metastatic disease. CXR done 05-30-2018 reviewed with pt and was negative.    Labs done 11/18/2018 reviewed and showed WBC 6.7 HB 14.1 plts 253,000.  Chemistries WNL with K+ 4.1 Cr 0.91 and normal LFTs.  BHCG <1 and AFP WNL at 5.4.  Tumor markers remain WNL and stable.  Pt is approaching 5 years from completion of treatment in 01/2019.  He will RTC in 04/2019 with repeat labs and follow-up.   2.  Chest wall discomfort.  He denies any symptoms today.  Pt reports family history of cardiac disease in a grandfather who died in his 13s.  CXR done 2018/05/30 was negative.  Mother felt symptoms related to port scar tissue.  He should notify the office if symptoms recur.    3.  Family history of CV disease.  Mother reports grandfather died in his 74s from CAD.  Pt was given option of cardiology referral in the past.   Follow-up with PCP if change in symptoms.    4.  Health maintenance.  Pt denies smoking.  Follow-up with PCP as directed.    INTERVAL HISTORY:  Historical data obtained from note dated 11/09/2017:  28 y.o. male followed by Dr. 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 He received 2 cycles of BEP and 2 cycles of EP due to DLCO abnormalities.    Current Status:  Pt is seen today for follow-up.  He reports chest wall discomfort has resolved.  He is here to go over labs.   Oncology History   Patient experienced trauma 1-2 years prior to diagnosis. A Bard enlarging mass until mother discovered it. Underwent left radical orchiectomy on 08/07/2013. No scans or repeat lab tests have been performed until the time he presented at the [facility] for evaluation.     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  Pre op a-FP =1620 on 07/22/2013  B-HCG=3225    11/24/2013 - 12/29/2013 Chemotherapy    BEP 2, discontinued because of decrease in DLCO    01/05/2014 - 01/26/2014 Chemotherapy    EP 4 with normalization of beta-hCG and alpha-fetoprotein    06/15/2014 Imaging    CT C/A/P Pulmonary lesions within the left lower lobe have decreased in size from previous exam consistent with response to therapy. 2. No new  or progressive disease identified.    03/11/2015 Imaging    CT CAP- Status post left orchiectomy. New complete resolution of prior left lower lobe mass. Mild residual scarring/opacity measuring 1.5 x 1.6 cm. Mildly prominent mediastinal lymph nodes, indeterminate.No evidence of new/progressive metastatic disease    06/25/2015 Imaging    CT chest- Stable ill-defined left lower lobe nodular opacity measuring 1.5 x 1.6 cm. Stable mildly enlarged  partially calcified mediastinal lymph nodes. No new or progressive disease identified within the thorax.    11/10/2015 Imaging    CT CAP- No evidence of metastatic disease. Scarring in the left lower lobe at the site of previous metastasis    11/08/2016 Imaging    CT CAP- Stable exam. No evidence of metastatic disease or other acute findings within the chest, abdomen, or pelvis.    11/05/2017 Imaging    CT CAP: Unremarkable and stable CT examination of the chest, abdomen and pelvis. No findings for recurrent metastatic disease.      Problem List Patient Active Problem List   Diagnosis Date Noted  . Abnormal pulmonary function test, 78% of predicted DLCO [R94.2] 02/16/2014  . Allergic rhinitis [J30.9] 01/20/2014  . 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 Mentally challenged [F79] 10/23/2013  . Cancer of testis, non-seminomatous ([place]) [C62.90] 10/22/2013    Past Medical History Past Medical History:  Diagnosis Date  . Anxiety   . Autistic disorder   . Cancer ([place])    testicular  . Mentally challenged    mother states mentally "slow"  . Seasonal allergies     Past Surgical History Past Surgical History:  Procedure Laterality Date  . ORCHIECTOMY Left 08/07/2013   Procedure: RADICAL ORCHIECTOMY;  Surgeon: Marissa Nestle, MD;  Location: [facility];  Service: Urology;  Laterality: Left;  . PORT-A-CATH REMOVAL Right July 2015  . PORTACATH PLACEMENT Right   . RADICAL ORCHIECTOMY Left 08/07/2013    Family History Family History  Problem Relation Age of Onset  . Cancer Maternal Aunt   . Cancer Maternal Aunt      Social History  reports that he has never smoked. He has never used smokeless tobacco. He reports that he does not drink alcohol or use drugs.  Medications  Current Outpatient Medications:  .  ibuprofen (ADVIL,MOTRIN) 200 MG tablet, Take 200 mg by mouth every 6 (six) hours as needed. Reported on 11/04/2015, Disp: , Rfl:  No current  facility-administered medications for this visit.   Facility-Administered Medications Ordered in Other Visits:  .  LORazepam (ATIVAN) injection 0.5 mg, 0.5 mg, Intravenous, Once, Farrel Gobble, MD  Allergies Penicillins  Review of Systems Review of Systems - Oncology ROS negative   Physical Exam  Vitals Wt Readings from Last 3 Encounters:  11/26/18 268 lb 9 oz (121.8 kg)  05/14/18 259 lb 3.2 oz (117.6 kg)  11/09/17 249 lb 1.6 oz (113 kg)   Temp Readings from Last 3 Encounters:  11/26/18 98.9 F (37.2 C) (Oral)  05/14/18 98.2 F (36.8 C) (Oral)  11/09/17 98.3 F (36.8 C) (Oral)   BP Readings from Last 3 Encounters:  11/26/18 138/82  05/14/18 123/77  11/09/17 118/76   Pulse Readings from Last 3 Encounters:  11/26/18 88  05/14/18 91  11/09/17 80   Constitutional: Well-developed, well-nourished, and in no distress.   HENT: Head: Normocephalic and atraumatic.  Mouth/Throat: No oropharyngeal exudate. Mucosa moist. Eyes: Pupils are equal, round, and reactive to light. Conjunctivae are normal. No scleral icterus.  Neck: Normal range  of motion. Neck supple. No JVD present.  Cardiovascular: Normal rate, regular rhythm and normal heart sounds.  Exam reveals no gallop and no friction rub.   No murmur heard. Pulmonary/Chest: Effort normal and breath sounds normal. No respiratory distress. No wheezes.No rales.  Abdominal: Soft. Bowel sounds are normal. No distension. There is no tenderness. There is no guarding.  Musculoskeletal: No edema or tenderness.  Lymphadenopathy: No cervical, axillary or supraclavicular adenopathy.  Neurological: Alert and oriented to person, place, and time. No cranial nerve deficit.  Skin: Skin is warm and dry. No rash noted. No erythema. No pallor.  Psychiatric: Affect and judgment normal.   Labs No visits with results within 3 Day(s) from this visit.  Latest known visit with results is:  Appointment on 11/18/2018  Component Date Value Ref  Range Status  . LDH 11/18/2018 146  98 - 192 U/L Final   Performed at [facility], [address], [place], [place] [address]  . hCG Quant 11/18/2018 <1  0 - 3 mIU/mL Final   Comment: (NOTE) Roche ECLIA methodology Performed At: [facility] [place], [place] [id number] Rush Farmer MD LX:[id number]   . AFP, Serum, Tumor Marker 11/18/2018 5.4  0.0 - 8.3 ng/mL Final   Comment: (NOTE) Roche Diagnostics Electrochemiluminescence Immunoassay (ECLIA) Values obtained with different assay methods or kits cannot be used interchangeably.  Results cannot be interpreted as absolute evidence of the presence or absence of malignant disease. This test is not interpretable in pregnant females. Performed At: [facility] [place], [place] [id number] Rush Farmer MD IW:[id number]   . WBC 11/18/2018 6.7  4.0 - 10.5 K/uL Final  . RBC 11/18/2018 4.97  4.22 - 5.81 MIL/uL Final  . Hemoglobin 11/18/2018 14.1  13.0 - 17.0 g/dL Final  . HCT 11/18/2018 43.7  39.0 - 52.0 % Final  . MCV 11/18/2018 87.9  80.0 - 100.0 fL Final  . MCH 11/18/2018 28.4  26.0 - 34.0 pg Final  . MCHC 11/18/2018 32.3  30.0 - 36.0 g/dL Final  . RDW 11/18/2018 12.8  11.5 - 15.5 % Final  . Platelets 11/18/2018 253  150 - 400 K/uL Final  . nRBC 11/18/2018 0.0  0.0 - 0.2 % Final  . Neutrophils Relative % 11/18/2018 65  % Final  . Neutro Abs 11/18/2018 4.3  1.7 - 7.7 K/uL Final  . Lymphocytes Relative 11/18/2018 23  % Final  . Lymphs Abs 11/18/2018 1.6  0.7 - 4.0 K/uL Final  . Monocytes Relative 11/18/2018 9  % Final  . Monocytes Absolute 11/18/2018 0.6  0.1 - 1.0 K/uL Final  . Eosinophils Relative 11/18/2018 3  % Final  . Eosinophils Absolute 11/18/2018 0.2  0.0 - 0.5 K/uL Final  . Basophils Relative 11/18/2018 0  % Final  . Basophils Absolute 11/18/2018 0.0  0.0 - 0.1 K/uL Final  . Immature Granulocytes 11/18/2018 0  % Final  . Abs Immature Granulocytes 11/18/2018 0.02  0.00 - 0.07  K/uL Final   Performed at [facility], [address] [place], [place] [address]  . Sodium 11/18/2018 139  135 - 145 mmol/L Final  . Potassium 11/18/2018 4.1  3.5 - 5.1 mmol/L Final  . Chloride 11/18/2018 108  98 - 111 mmol/L Final  . CO2 11/18/2018 25  22 - 32 mmol/L Final  . Glucose, Bld 11/18/2018 102* 70 - 99 mg/dL Final  . BUN 11/18/2018 17  6 - 20 mg/dL Final  . Creatinine, Ser 11/18/2018 0.91  0.61 - 1.24 mg/dL Final  . Calcium 11/18/2018 9.5  8.9 - 10.3 mg/dL Final  . Total Protein 11/18/2018 7.6  6.5 - 8.1 g/dL Final  . Albumin 11/18/2018 4.4  3.5 - 5.0 g/dL Final  . AST 11/18/2018 28  15 - 41 U/L Final  . ALT 11/18/2018 34  0 - 44 U/L Final  . Alkaline Phosphatase 11/18/2018 68  38 - 126 U/L Final  . Total Bilirubin 11/18/2018 0.5  0.3 - 1.2 mg/dL Final  . GFR calc non Af Amer 11/18/2018 >60  >60 mL/min Final  . GFR calc Af Amer 11/18/2018 >60  >60 mL/min Final  . Anion gap 11/18/2018 6  5 - 15 Final   Performed at [facility], [address], [place], [place] [address]     Pathology Orders Placed This Encounter  Procedures  . CBC with Differential/Platelet    Standing Status:   Future    Standing Expiration Date:   11/27/2019  . Comprehensive metabolic panel    Standing Status:   Future    Standing Expiration Date:   11/27/2019  . Lactate dehydrogenase    Standing Status:   Future    Standing Expiration Date:   11/27/2019  . HCG, tumor marker    Standing Status:   Future    Standing Expiration Date:   11/27/2019  . AFP tumor marker    Standing Status:   Future    Standing Expiration Date:   11/27/2019       Zoila Shutter MD

## 2019-04-24 ENCOUNTER — Other Ambulatory Visit: Payer: Self-pay

## 2019-04-24 ENCOUNTER — Inpatient Hospital Stay (HOSPITAL_COMMUNITY): Payer: Self-pay | Attending: Hematology

## 2019-04-24 DIAGNOSIS — Z8547 Personal history of malignant neoplasm of testis: Secondary | ICD-10-CM | POA: Insufficient documentation

## 2019-04-24 DIAGNOSIS — C6290 Malignant neoplasm of unspecified testis, unspecified whether descended or undescended: Secondary | ICD-10-CM

## 2019-04-24 DIAGNOSIS — Z9221 Personal history of antineoplastic chemotherapy: Secondary | ICD-10-CM | POA: Insufficient documentation

## 2019-04-24 DIAGNOSIS — Z9079 Acquired absence of other genital organ(s): Secondary | ICD-10-CM | POA: Insufficient documentation

## 2019-04-24 DIAGNOSIS — Z79899 Other long term (current) drug therapy: Secondary | ICD-10-CM | POA: Insufficient documentation

## 2019-04-24 DIAGNOSIS — F840 Autistic disorder: Secondary | ICD-10-CM | POA: Insufficient documentation

## 2019-04-24 LAB — COMPREHENSIVE METABOLIC PANEL
ALT: 29 U/L (ref 0–44)
AST: 24 U/L (ref 15–41)
Albumin: 4.3 g/dL (ref 3.5–5.0)
Alkaline Phosphatase: 75 U/L (ref 38–126)
Anion gap: 8 (ref 5–15)
BUN: 15 mg/dL (ref 6–20)
CO2: 25 mmol/L (ref 22–32)
Calcium: 9.3 mg/dL (ref 8.9–10.3)
Chloride: 108 mmol/L (ref 98–111)
Creatinine, Ser: 0.97 mg/dL (ref 0.61–1.24)
GFR calc Af Amer: >60 mL/min (ref >60)
GFR calc non Af Amer: >60 mL/min (ref >60)
Glucose, Bld: 103 mg/dL — ABNORMAL HIGH (ref 70–99)
Potassium: 3.8 mmol/L (ref 3.5–5.1)
Sodium: 141 mmol/L (ref 135–145)
Total Bilirubin: 0.4 mg/dL (ref 0.3–1.2)
Total Protein: 7.5 g/dL (ref 6.5–8.1)

## 2019-04-24 LAB — CBC WITH DIFFERENTIAL/PLATELET
Abs Immature Granulocytes: 0.02 10*3/uL (ref 0.00–0.07)
Basophils Absolute: 0 10*3/uL (ref 0.0–0.1)
Basophils Relative: 1 %
Eosinophils Absolute: 0.2 10*3/uL (ref 0.0–0.5)
Eosinophils Relative: 3 %
HCT: 42.3 % (ref 39.0–52.0)
Hemoglobin: 13.6 g/dL (ref 13.0–17.0)
Immature Granulocytes: 0 %
Lymphocytes Relative: 26 %
Lymphs Abs: 1.7 10*3/uL (ref 0.7–4.0)
MCH: 28.2 pg (ref 26.0–34.0)
MCHC: 32.2 g/dL (ref 30.0–36.0)
MCV: 87.8 fL (ref 80.0–100.0)
Monocytes Absolute: 0.5 10*3/uL (ref 0.1–1.0)
Monocytes Relative: 9 %
Neutro Abs: 3.9 10*3/uL (ref 1.7–7.7)
Neutrophils Relative %: 61 %
Platelets: 247 10*3/uL (ref 150–400)
RBC: 4.82 MIL/uL (ref 4.22–5.81)
RDW: 13.2 % (ref 11.5–15.5)
WBC: 6.3 10*3/uL (ref 4.0–10.5)
nRBC: 0 % (ref 0.0–0.2)

## 2019-04-24 LAB — LACTATE DEHYDROGENASE: LDH: 156 U/L (ref 98–192)

## 2019-04-24 NOTE — Progress Notes (Signed)
For review.  Please update ordering provider

## 2019-04-24 NOTE — Progress Notes (Signed)
For review

## 2019-04-25 LAB — BETA HCG QUANT (REF LAB): hCG Quant: <1 m[IU]/mL (ref 0–3)

## 2019-04-25 LAB — AFP TUMOR MARKER: AFP, Serum, Tumor Marker: 5.5 ng/mL (ref 0.0–8.3)

## 2019-05-01 ENCOUNTER — Ambulatory Visit (HOSPITAL_COMMUNITY): Payer: Self-pay | Admitting: Hematology
